# Patient Record
Sex: Male | Born: 1940 | Race: White | Hispanic: No | Marital: Married | Smoking: Never smoker
Health system: Southern US, Community
[De-identification: ages and names within clinical notes are randomized; demographics above are authoritative.]

## PROBLEM LIST (undated history)

## (undated) DIAGNOSIS — I700 Atherosclerosis of aorta: Secondary | ICD-10-CM

## (undated) DIAGNOSIS — I2510 Atherosclerotic heart disease of native coronary artery without angina pectoris: Secondary | ICD-10-CM

## (undated) DIAGNOSIS — N200 Calculus of kidney: Secondary | ICD-10-CM

## (undated) DIAGNOSIS — E039 Hypothyroidism, unspecified: Secondary | ICD-10-CM

## (undated) DIAGNOSIS — Z87442 Personal history of urinary calculi: Secondary | ICD-10-CM

## (undated) DIAGNOSIS — I350 Nonrheumatic aortic (valve) stenosis: Secondary | ICD-10-CM

## (undated) DIAGNOSIS — M1990 Unspecified osteoarthritis, unspecified site: Secondary | ICD-10-CM

## (undated) DIAGNOSIS — K449 Diaphragmatic hernia without obstruction or gangrene: Secondary | ICD-10-CM

## (undated) DIAGNOSIS — I639 Cerebral infarction, unspecified: Secondary | ICD-10-CM

## (undated) DIAGNOSIS — K219 Gastro-esophageal reflux disease without esophagitis: Secondary | ICD-10-CM

## (undated) DIAGNOSIS — E785 Hyperlipidemia, unspecified: Secondary | ICD-10-CM

## (undated) DIAGNOSIS — G2581 Restless legs syndrome: Secondary | ICD-10-CM

## (undated) HISTORY — PX: SHOULDER SURGERY: SHX246

## (undated) HISTORY — PX: CHOLECYSTECTOMY: SHX55

## (undated) HISTORY — PX: KNEE ARTHROSCOPY: SUR90

## (undated) HISTORY — PX: OTHER SURGICAL HISTORY: SHX169

## (undated) HISTORY — PX: APPENDECTOMY: SHX54

---

## 2007-03-25 ENCOUNTER — Ambulatory Visit (HOSPITAL_BASED_OUTPATIENT_CLINIC_OR_DEPARTMENT_OTHER): Admission: RE | Admit: 2007-03-25 | Discharge: 2007-03-25 | Payer: Self-pay | Admitting: Orthopedic Surgery

## 2007-06-24 ENCOUNTER — Ambulatory Visit: Payer: Self-pay | Admitting: Internal Medicine

## 2007-07-03 ENCOUNTER — Ambulatory Visit: Payer: Self-pay | Admitting: Internal Medicine

## 2007-08-12 ENCOUNTER — Ambulatory Visit: Payer: Self-pay | Admitting: Internal Medicine

## 2009-11-10 DIAGNOSIS — N419 Inflammatory disease of prostate, unspecified: Secondary | ICD-10-CM | POA: Insufficient documentation

## 2009-11-10 DIAGNOSIS — N201 Calculus of ureter: Secondary | ICD-10-CM | POA: Insufficient documentation

## 2010-10-24 NOTE — Assessment & Plan Note (Signed)
[facility]                         ELECTROPHYSIOLOGY OFFICE NOTE   Jordan Hammond, Jordan Hammond                         MRN:          [id number]  DATE:06/25/2007                            DOB:          08/23/1940    [name] is referred today by Dr. Viann Fish for EP evaluation.  The  patient was hospitalized briefly at [facility] and had  been seen by a physician where he underwent 2-D echocardiogram and  stress testing, both of which were unremarkable.  He does have a history  of palpitations and documented PACs and PVCs by cardiac monitoring, but  was, in fact, told he needed an EP study and a catheterization.  He is  here today for additional evaluation and treatment.  The patient notes  that he does occasionally have chest pain.  This is not related to  exertion.  It is not related to strenuous activity.  It is related to  him sometimes lying or sitting down.  There is no radiation of the pain.  There is no nausea.  There is no associated shortness of breath.   PAST MEDICAL HISTORY:  1. History of recurrent reflux symptoms in the past.  2. History of a meniscal tear to his right knee which was treated with      arthroscopic surgery.  3. History of Nissen fundoplication secondary to severe reflux.   FAMILY HISTORY:  Negative for premature coronary disease, although there  are family members who have had coronary disease.   SOCIAL HISTORY:  The patient is married.  He denies tobacco or ethanol  abuse.   REVIEW OF SYSTEMS:  Otherwise unremarkable except as noted above and for  a very rare palpitation.  He specifically denies any sustained heart  racing.  He has never had syncope.   PHYSICAL EXAMINATION:  GENERAL:  He is a pleasant, very well-appearing,  70 year old man who looks somewhat younger than his stated age.  VITAL SIGNS:  Blood pressure was 121/81, pulse was 70 and regular,  respirations 18, weight 210 pounds.  HEENT:   Normocephalic and atraumatic.  Pupils equal, round.  Oropharynx  is moist.  Sclerae anicteric.  NECK:  No jugular distention.  LUNGS:  Clear bilaterally to auscultation.  No wheezes, rales or rhonchi  are present.  CARDIAC:  Regular rate and rhythm with normal S1, S2.  No murmurs, rubs,  gallops.  PMI was not enlarged or laterally displaced.  EXTREMITIES:  Demonstrated no edema.  ABDOMEN:  Soft, nontender, nondistended.  No organomegaly present.  SKIN:  Normal.  NEUROLOGIC:  Alert and oriented x3 with cranial nerves intact.  Strength  was 5/5 and symmetric.   IMPRESSION:  1. Minimally symptomatic premature atrial contractions and premature      ventricular contractions.  2. Very atypical chest pain (noncardiac).  3. History of gastroesophageal reflux disease, status post Nissen      fundoplication.   DISCUSSION:  The etiology of the patient's symptoms are unclear to me.  They have been fairly quiet.  I recommend that he undergo regular  exercise treadmill testing to  see if there is any evidence of exercise-  induced arrhythmias or exercise-induced ST and T-wave changes. This will  be scheduled at the earliest possible convenient time.     Doylene Canning. Ladona Ridgel, MD  Electronically Signed    GWT/MedQ  DD: 06/26/2007  DT: 06/27/2007  Job #: [id number]   cc:   Georga Hacking, M.D.

## 2010-10-24 NOTE — Op Note (Signed)
Jordan Hammond, Jordan Hammond                ACCOUNT NO.:  [id number]   MEDICAL RECORD NO.:  [id number]          PATIENT TYPE:  AMB   LOCATION:  DSC                          FACILITY:  [facility]   PHYSICIAN:  Feliberto Gottron. Turner Daniels, M.D.   DATE OF BIRTH:  1941-01-14   DATE OF PROCEDURE:  03/25/2007  DATE OF DISCHARGE:                               OPERATIVE REPORT   PREOPERATIVE DIAGNOSIS:  Medial meniscal tear of the right knee.   POSTOPERATIVE DIAGNOSIS:  Medial meniscal tear of the right knee and  chondromalacia medial femoral condyle and lateral meniscal tear of the  right knee.   PROCEDURE:  Right knee arthroscopic partial medial meniscectomy,  debridement chondromalacia grade 3 from the distal aspect of the medial  femoral condyle and debridement of degenerative tearing of the lateral  meniscus.   SURGEON:  Feliberto Gottron.  Turner Daniels, MD.   FIRST ASSISTANT:  None.   ANESTHETIC:  Local with IV sedation as well as about 5 or 10 minutes of  general mask.   ESTIMATED BLOOD LOSS:  Minimal.   FLUID REPLACEMENT:  800 mL of crystalloid.   DRAINS PLACED:  None.   TOURNIQUET TIME:  None.   INDICATIONS FOR PROCEDURE:  A 70 year old man with catching, popping,  pain and effusion in his right knee who presented to our office a few  days ago with a right knee that was essentially locked, could not come  to full extension and MRI scan from his primary care physician showing a  complex tearing of the posterior horn of the medial meniscus.  In order  to decrease pain and increase function, he is taken to the operating  room for arthroscopic decompression of his right knee.  Risks and  benefits of surgery discussed, questions answered.   DESCRIPTION OF PROCEDURE:  The patient identified by armband and  underwent local block anesthesia of his right knee in the block area at  [facility].  He was then  transported to operating room 2, appropriate site monitors were  attached.   Lateral post applied to the table and the right lower  extremity prepped and draped in the usual sterile fashion from the ankle  to the midthigh.  We began the procedure by making standard inferomedial  and inferolateral peripatellar portals introducing the arthroscope  through the inferolateral portal with the inflow attached and the pump  pressure set at 60.  This caused a significant amount of discomfort and  at that point, anesthesia elected to begin general mask anesthesia.  Diagnostic arthroscopy revealed essentially a normal right  patellofemoral joint on the medial side, posterior horn of the medial  meniscus was in fact shredded, had large parrot beak tear and this was  debrided back to a stable margin using a small biter and a 3.5 Gator  sucker shaver.  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  The ACL and PCL were noted to be intact.  The lateral side was pristine  except for a  small degenerative tearing posterior lateral horn to  lateral meniscus and this was debrided back to a stable margin with 3.5  Gator sucker shaver.  The gutters were cleared medially and laterally.  The knee irrigated out with normal saline solution.  The arthroscopic  instruments were removed and a dressing of Xeroform, 4x4 dressing  sponges, Webril and Ace wrap applied.  The patient was then undraped,  awakened and taken to the recovery room without difficulty.      Feliberto Gottron. Turner Daniels, M.D.  Electronically Signed     FJR/MEDQ  D:  03/25/2007  T:  03/25/2007  Job:  [id number]

## 2010-10-24 NOTE — Assessment & Plan Note (Signed)
Jordan Hammond                         ELECTROPHYSIOLOGY OFFICE NOTE   Jordan Hammond, Jordan Hammond                         MRN:          Hammond  DATE:08/12/2007                            DOB:          Jordan Hammond, Jordan Hammond    HISTORY OF PRESENT ILLNESS:  [name] returns today for follow-up.  He  is a very pleasant 70 year old male with a history of palpitations,  atypical chest pain, who underwent exercise treadmill testing several  weeks ago and returns today for follow-up.  Prior to this, he had  undergone stress Myoview perfusion study with adenosine at the [facility] which was clinically and electrically negative.  The  patient's episodes of chest pain have resolved.  They were typically not  related to exertion.  He had no specific complaints today.  He denies  palpitations, chest pain or shortness of breath.   MEDICATIONS:  1. Lovastatin 20 a day.  2. Klonopin.  3. Boniva.  4. Multiple vitamins.  5. Calcium.  6. Flax seed oil.   PHYSICAL EXAMINATION:  GENERAL:  He is a pleasant well-appearing 67-year-  old man in no acute distress.  VITAL SIGNS:  Blood pressure is 102/76, pulse 76 and regular,  respirations were 18.  Weight was 214 pounds.  NECK:  Revealed no jugular distention.  LUNGS:  Clear bilaterally to auscultation.  No wheezes, rales or rhonchi  are present.  CARDIOVASCULAR:  Regular rate and rhythm, normal S1-S2.  EXTREMITIES:  Demonstrated no edema.   STUDIES:  No EKG was done today.  Review of the patient's exercise  treadmill test demonstrates that he walks for over 9 minutes on a Bruce  protocol.  The patient did have 1-1/2 mm of ST-segment depression with  exertion which resolved immediately with rest.  There were no exercise-  induced arrhythmias.  There were no symptoms of chest pain or shortness  of breath with exercise.  The test was stopped secondary to fatigue.   IMPRESSION:  1. Atypical chest pain.  2. Negative perfusion  Myoview study in the past with negative      treadmill test for exercise-induced arrhythmias but with transient      ST-segment depression resolving spontaneously with rest.  3. History of palpitations.   DISCUSSION:  I have recommend a period of watchful waiting for [name].  His symptoms are resolved.  I have asked that he start a walking program  where he walks several days a week for  20-30 minutes with a gradually increasing the frequency and duration of  his exercise.  I will see him back in the office on a p.r.n. basis.  He  will continue his present medical therapy.     Doylene Canning. Ladona Ridgel, MD  Electronically Signed    GWT/MedQ  DD: 08/12/2007  DT: 08/13/2007  Job #: [id number]   cc:   Georga Hacking, M.D.

## 2010-10-24 NOTE — Letter (Signed)
June 26, 2007    W. Viann Fish, M.D.  [address]  [place],  [place] [address]   RE:  Jordan Hammond, Jordan Hammond  MRN:  [id number]  /  DOB:  July 15, 1940   Dear Karleen Hampshire:   Thank you for referring Jordan Hammond for EP evaluation.  As you  know, he is a very pleasant 70 year old man whose health has been quite  good,  but was seen in the [facility] several weeks ago  with atypical chest pain and palpitations.  Workup to date has been  fairly unremarkable.  He had underwent adenosine stress testing which  reportedly demonstrated no evidence of ischemia, normal LV function and  was otherwise unremarkable.  He has worn a cardiac monitor which  demonstrated no symptoms and very infrequent PACs and PVCs.  He is  presently undergoing a regular monitor.  He has presently undergoing a  long-term monitor as we speak.  Since presenting to the [facility], he does occasionally complain of chest discomfort.  He notes  that this is very definitively not related to exertion or strenuous or  physical activity or to any emotional stress.  He states that he is  experiencing these episodes when he lies down or sometimes when he is  just sitting.  There was no associated shortness of breath or  diaphoresis or radiation of the pain.  He wonders if the pain is not  related to some GI problem, although he does have a history of GI  surgery for to rid him of acid reflux several years ago.  Additional  information is notable in that the patient has LV function of 60% and no  significant valvular problems.  The patient reports to me that it was  requested that he undergo EP study and it was not quite clear why this  was, although he does have some palpitations.  These have not been  particularly severe.   PHYSICAL EXAMINATION:  His physical exam today is basically  unremarkable.   I have discussed the issues and the likely benign nature of the  patient's chest discomfort  with him.  Also it is very clear that his  palpitations are fairly minimal.  I have recommended he undergo regular  exercise treadmill testing and will have this carried out in the next  several days.  Otherwise, a period of watchful waiting would be  appropriate.  Consideration for additional anti-acid medications would  also be in order.  Obviously, if wearing a cardiac monitor and he was to  developed arrhythmias, then additional evaluation might be warranted.  Thanks for referring Jordan Hammond for EP evaluation.    Sincerely,      Doylene Canning. Ladona Ridgel, MD  Electronically Signed    GWT/MedQ  DD: 06/26/2007  DT: 06/26/2007  Job #: [id number]

## 2010-10-24 NOTE — Procedures (Signed)
[facility]                              EXERCISE TREADMILL   Jordan Hammond, Jordan Hammond                         MRN:          [id number]  DATE:07/03/2007                            DOB:          04-05-1941    HISTORY:  The patient is a very pleasant 70 year old male with a history  of palpitations and atypical chest pain who has been referred in the  past by Dr. Donnie Aho.  He has had no documented sustained arrhythmias.  He  had an adenosine stress test performed in the past.  The patient is here  today to see if he has exercise-induced arrhythmias secondary to prior  palpitations and to see whether he has any exercise-induced evidence of  ischemia.   PROCEDURE:  After informed consent was obtained, the patient was prepped  in the usual manner.  His initial heart rate was 75, and his blood  pressure 123/83.  He underwent exercise treadmill testing utilizing the  Bruce protocol.  His heart rate increased from the low 100s initially  into exercise up to a high of 168 beats per minute which was above 100%  of predicted for his 70 years of age.  The patient's blood pressure  which was initially in the 120s increased up to 150 and then at the end  of exercise dropped to 137.  He exercised for a total of 9 minutes on  the Bruce protocol completing Stage 3.  The patient had no chest pain or  shortness of breath, though he did become fatigued and his test was  stopped secondary to this.  Electrically, the patient had almost 2 mm of  downsloping ST segment depression in the lateral leads which resolved  almost immediately within the recovery.  In recovery, he was observed  for 6 minutes, and his heart rate and blood pressure returned back to  normal.   COMPLICATIONS:  There are no major complications.   RESULTS:  This demonstrates a clinically negative and electrically  positive exercise treadmill test with the blood pressure's response  attenuated.  The very quick  resolution of his ST segment changes reduces  the specificity of the results of the treadmill test.   PLAN:  I plan to see the patient back in the office in followup for  additional evaluation.     Doylene Canning. Ladona Ridgel, MD  Electronically Signed    GWT/MedQ  DD: 07/03/2007  DT: 07/04/2007  Job #: [id number]   cc:   Georga Hacking, M.D.

## 2012-03-12 DIAGNOSIS — T17308A Unspecified foreign body in larynx causing other injury, initial encounter: Secondary | ICD-10-CM | POA: Insufficient documentation

## 2012-03-12 DIAGNOSIS — R1310 Dysphagia, unspecified: Secondary | ICD-10-CM | POA: Insufficient documentation

## 2012-03-12 DIAGNOSIS — R490 Dysphonia: Secondary | ICD-10-CM | POA: Insufficient documentation

## 2012-03-12 DIAGNOSIS — K219 Gastro-esophageal reflux disease without esophagitis: Secondary | ICD-10-CM | POA: Insufficient documentation

## 2012-04-22 DIAGNOSIS — K279 Peptic ulcer, site unspecified, unspecified as acute or chronic, without hemorrhage or perforation: Secondary | ICD-10-CM | POA: Insufficient documentation

## 2012-04-22 DIAGNOSIS — Z9889 Other specified postprocedural states: Secondary | ICD-10-CM | POA: Insufficient documentation

## 2014-09-08 DIAGNOSIS — S8002XA Contusion of left knee, initial encounter: Secondary | ICD-10-CM | POA: Insufficient documentation

## 2015-05-27 DIAGNOSIS — Z1211 Encounter for screening for malignant neoplasm of colon: Secondary | ICD-10-CM | POA: Insufficient documentation

## 2015-08-05 DIAGNOSIS — K635 Polyp of colon: Secondary | ICD-10-CM | POA: Insufficient documentation

## 2016-03-05 DIAGNOSIS — R252 Cramp and spasm: Secondary | ICD-10-CM | POA: Insufficient documentation

## 2016-07-07 DIAGNOSIS — M1712 Unilateral primary osteoarthritis, left knee: Secondary | ICD-10-CM | POA: Diagnosis not present

## 2016-09-18 DIAGNOSIS — G4762 Sleep related leg cramps: Secondary | ICD-10-CM | POA: Diagnosis not present

## 2016-09-18 DIAGNOSIS — M1712 Unilateral primary osteoarthritis, left knee: Secondary | ICD-10-CM | POA: Diagnosis not present

## 2016-09-18 DIAGNOSIS — R011 Cardiac murmur, unspecified: Secondary | ICD-10-CM | POA: Diagnosis not present

## 2016-09-18 DIAGNOSIS — G2581 Restless legs syndrome: Secondary | ICD-10-CM | POA: Diagnosis not present

## 2016-12-24 DIAGNOSIS — M159 Polyosteoarthritis, unspecified: Secondary | ICD-10-CM | POA: Diagnosis not present

## 2016-12-24 DIAGNOSIS — E785 Hyperlipidemia, unspecified: Secondary | ICD-10-CM | POA: Diagnosis not present

## 2016-12-24 DIAGNOSIS — E559 Vitamin D deficiency, unspecified: Secondary | ICD-10-CM | POA: Diagnosis not present

## 2016-12-24 DIAGNOSIS — R011 Cardiac murmur, unspecified: Secondary | ICD-10-CM | POA: Diagnosis not present

## 2016-12-24 DIAGNOSIS — E039 Hypothyroidism, unspecified: Secondary | ICD-10-CM | POA: Diagnosis not present

## 2016-12-24 DIAGNOSIS — R5383 Other fatigue: Secondary | ICD-10-CM | POA: Diagnosis not present

## 2016-12-24 DIAGNOSIS — N400 Enlarged prostate without lower urinary tract symptoms: Secondary | ICD-10-CM | POA: Diagnosis not present

## 2016-12-24 DIAGNOSIS — G2581 Restless legs syndrome: Secondary | ICD-10-CM | POA: Diagnosis not present

## 2016-12-24 DIAGNOSIS — E782 Mixed hyperlipidemia: Secondary | ICD-10-CM | POA: Diagnosis not present

## 2016-12-24 DIAGNOSIS — G8929 Other chronic pain: Secondary | ICD-10-CM | POA: Diagnosis not present

## 2016-12-24 DIAGNOSIS — R350 Frequency of micturition: Secondary | ICD-10-CM | POA: Diagnosis not present

## 2016-12-24 DIAGNOSIS — Z79899 Other long term (current) drug therapy: Secondary | ICD-10-CM | POA: Diagnosis not present

## 2017-01-19 ENCOUNTER — Emergency Department (HOSPITAL_COMMUNITY): Payer: Medicare HMO

## 2017-01-19 ENCOUNTER — Encounter (HOSPITAL_COMMUNITY): Payer: Self-pay | Admitting: Emergency Medicine

## 2017-01-19 ENCOUNTER — Telehealth: Payer: Self-pay | Admitting: Nurse Practitioner

## 2017-01-19 ENCOUNTER — Inpatient Hospital Stay (HOSPITAL_COMMUNITY)
Admission: EM | Admit: 2017-01-19 | Discharge: 2017-02-04 | DRG: 217 | Disposition: A | Discharge status: Home / Self Care | Payer: Medicare HMO | Attending: Cardiothoracic Surgery | Admitting: Cardiothoracic Surgery

## 2017-01-19 DIAGNOSIS — M546 Pain in thoracic spine: Secondary | ICD-10-CM

## 2017-01-19 DIAGNOSIS — G2581 Restless legs syndrome: Secondary | ICD-10-CM

## 2017-01-19 DIAGNOSIS — Z882 Allergy status to sulfonamides status: Secondary | ICD-10-CM

## 2017-01-19 DIAGNOSIS — E785 Hyperlipidemia, unspecified: Secondary | ICD-10-CM | POA: Diagnosis present

## 2017-01-19 DIAGNOSIS — R011 Cardiac murmur, unspecified: Secondary | ICD-10-CM

## 2017-01-19 DIAGNOSIS — J942 Hemothorax: Secondary | ICD-10-CM

## 2017-01-19 DIAGNOSIS — Z79899 Other long term (current) drug therapy: Secondary | ICD-10-CM

## 2017-01-19 DIAGNOSIS — I350 Nonrheumatic aortic (valve) stenosis: Secondary | ICD-10-CM

## 2017-01-19 DIAGNOSIS — R001 Bradycardia, unspecified: Secondary | ICD-10-CM | POA: Diagnosis not present

## 2017-01-19 DIAGNOSIS — Z01818 Encounter for other preprocedural examination: Secondary | ICD-10-CM

## 2017-01-19 DIAGNOSIS — Z951 Presence of aortocoronary bypass graft: Secondary | ICD-10-CM

## 2017-01-19 DIAGNOSIS — R072 Precordial pain: Secondary | ICD-10-CM

## 2017-01-19 DIAGNOSIS — J95811 Postprocedural pneumothorax: Secondary | ICD-10-CM | POA: Diagnosis not present

## 2017-01-19 DIAGNOSIS — Z886 Allergy status to analgesic agent status: Secondary | ICD-10-CM

## 2017-01-19 DIAGNOSIS — R0602 Shortness of breath: Secondary | ICD-10-CM | POA: Diagnosis not present

## 2017-01-19 DIAGNOSIS — I2510 Atherosclerotic heart disease of native coronary artery without angina pectoris: Secondary | ICD-10-CM

## 2017-01-19 DIAGNOSIS — D62 Acute posthemorrhagic anemia: Secondary | ICD-10-CM | POA: Diagnosis not present

## 2017-01-19 DIAGNOSIS — K029 Dental caries, unspecified: Secondary | ICD-10-CM | POA: Diagnosis present

## 2017-01-19 DIAGNOSIS — I700 Atherosclerosis of aorta: Secondary | ICD-10-CM

## 2017-01-19 DIAGNOSIS — I25110 Atherosclerotic heart disease of native coronary artery with unstable angina pectoris: Secondary | ICD-10-CM | POA: Diagnosis not present

## 2017-01-19 DIAGNOSIS — R079 Chest pain, unspecified: Secondary | ICD-10-CM | POA: Diagnosis not present

## 2017-01-19 DIAGNOSIS — K0530 Chronic periodontitis, unspecified: Secondary | ICD-10-CM | POA: Diagnosis present

## 2017-01-19 DIAGNOSIS — D696 Thrombocytopenia, unspecified: Secondary | ICD-10-CM | POA: Diagnosis not present

## 2017-01-19 DIAGNOSIS — J939 Pneumothorax, unspecified: Secondary | ICD-10-CM

## 2017-01-19 DIAGNOSIS — Z8249 Family history of ischemic heart disease and other diseases of the circulatory system: Secondary | ICD-10-CM

## 2017-01-19 DIAGNOSIS — Z87442 Personal history of urinary calculi: Secondary | ICD-10-CM

## 2017-01-19 DIAGNOSIS — Z952 Presence of prosthetic heart valve: Secondary | ICD-10-CM

## 2017-01-19 DIAGNOSIS — K219 Gastro-esophageal reflux disease without esophagitis: Secondary | ICD-10-CM | POA: Diagnosis present

## 2017-01-19 DIAGNOSIS — E8770 Fluid overload, unspecified: Secondary | ICD-10-CM | POA: Diagnosis not present

## 2017-01-19 DIAGNOSIS — Y832 Surgical operation with anastomosis, bypass or graft as the cause of abnormal reaction of the patient, or of later complication, without mention of misadventure at the time of the procedure: Secondary | ICD-10-CM | POA: Diagnosis not present

## 2017-01-19 DIAGNOSIS — K06010 Localized gingival recession, unspecified: Secondary | ICD-10-CM | POA: Diagnosis present

## 2017-01-19 HISTORY — DX: Hyperlipidemia, unspecified: E78.5

## 2017-01-19 HISTORY — DX: Restless legs syndrome: G25.81

## 2017-01-19 HISTORY — DX: Diaphragmatic hernia without obstruction or gangrene: K44.9

## 2017-01-19 HISTORY — DX: Atherosclerosis of aorta: I70.0

## 2017-01-19 HISTORY — DX: Gastro-esophageal reflux disease without esophagitis: K21.9

## 2017-01-19 HISTORY — DX: Atherosclerotic heart disease of native coronary artery without angina pectoris: I25.10

## 2017-01-19 HISTORY — DX: Calculus of kidney: N20.0

## 2017-01-19 HISTORY — DX: Nonrheumatic aortic (valve) stenosis: I35.0

## 2017-01-19 LAB — BASIC METABOLIC PANEL
ANION GAP: 7 (ref 5–15)
BUN: 12 mg/dL (ref 6–20)
CO2: 26 mmol/L (ref 22–32)
Calcium: 8.8 mg/dL — ABNORMAL LOW (ref 8.9–10.3)
Chloride: 103 mmol/L (ref 101–111)
Creatinine, Ser: 1.11 mg/dL (ref 0.61–1.24)
GFR calc Af Amer: >60 mL/min (ref >60)
Glucose, Bld: 93 mg/dL (ref 65–99)
POTASSIUM: 3.8 mmol/L (ref 3.5–5.1)
SODIUM: 136 mmol/L (ref 135–145)

## 2017-01-19 LAB — I-STAT TROPONIN, ED: Troponin i, poc: 0 ng/mL (ref 0.00–0.08)

## 2017-01-19 LAB — CBC
HEMATOCRIT: 38.3 % — AB (ref 39.0–52.0)
HEMOGLOBIN: 13.2 g/dL (ref 13.0–17.0)
MCH: 31.3 pg (ref 26.0–34.0)
MCHC: 34.5 g/dL (ref 30.0–36.0)
MCV: 90.8 fL (ref 78.0–100.0)
Platelets: 166 10*3/uL (ref 150–400)
RBC: 4.22 MIL/uL (ref 4.22–5.81)
RDW: 12.8 % (ref 11.5–15.5)
WBC: 5.1 10*3/uL (ref 4.0–10.5)

## 2017-01-19 MED ORDER — IOPAMIDOL (ISOVUE-370) INJECTION 76%
INTRAVENOUS | Status: AC
  Administered 2017-01-19: 100 mL via INTRAVENOUS
  Filled 2017-01-19: qty 100

## 2017-01-19 MED ORDER — FAMOTIDINE IN NACL 20-0.9 MG/50ML-% IV SOLN
20.0000 mg | Freq: Once | INTRAVENOUS | Status: AC
  Administered 2017-01-19: 20 mg via INTRAVENOUS
  Filled 2017-01-19: qty 50

## 2017-01-19 NOTE — ED Provider Notes (Signed)
[facility]-EMERGENCY DEPT Provider Note   CSN: [id number] Arrival date & time: 01/19/17  1808     History   Chief Complaint Chief Complaint  Patient presents with  . Chest Pain    HPI Jordan Hammond is a 76 y.o. male.  Jordan Hammond is a 76 y.o. Male with a history of a heart murmur who presents to the emergency department complaining of chest pain and tightness ongoing since last night. Patient reports began having some substernal chest pain with associated pain to his upper back around 10 PM yesterday evening. He took one nitroglycerin without relief and then went to bed. He reports he woke up this morning and had gradual return of his chest pain. He reports it's been on and off during the day today. He is unable to identify any alleviating or aggravating factors for his chest pain. It is not worse with exertion. He denies any current shortness of breath, however he reports sometimes he does feel somewhat short of breath. He denies any coughing or hemoptysis. He is seeing cardiology previously for a heart murmur and reports that his echo was unremarkable. He denies history of MI, DVT or PE. No recent long travel or smoking. No leg pain or swelling. He reports possible history of hyperlipidemia, however he is not on medication. He is not a smoker. Patient does report he's been sitting at church more over the past several days. He reports this is in the does intermittently and reports he had a similar problem with pain in his chest last time he started singing again. He wonders if this could be the cause of his pain. He does report feeling slightly more fatigued over the past several weeks. No dyspnea on exertion. He denies having lightheaded and dizzy. He denies fevers, palpitations, leg pain, [place], syncope, lightheadedness, abdominal pain, nausea, vomiting, diarrhea, numbness, tingling, weakness or rashes.  He took four 325 mg asa at home prior to arrival. EMS provided him with NTG paste.    The  history is provided by the patient and medical records. No language interpreter was used.  Chest Pain   Associated symptoms include back pain. Pertinent negatives include no abdominal pain, no cough, no dizziness, no fever, no headaches, no nausea, no numbness, no palpitations, no shortness of breath, no vomiting and no weakness.    Past Medical History:  Diagnosis Date  . Murmur, cardiac   . Restless leg syndrome     Patient Active Problem List   Diagnosis Date Noted  . Chest pain at rest 01/20/2017  . Bradycardia 01/20/2017  . Restless leg syndrome 01/20/2017  . Precordial pain     Past Surgical History:  Procedure Laterality Date  . bowel obstruction surgery    . CHOLECYSTECTOMY    . lap nissan         Home Medications    Prior to Admission medications   Medication Sig Start Date End Date Taking? Authorizing Provider  acetaminophen (TYLENOL) 500 MG tablet Take 1,000 mg by mouth every 6 (six) hours as needed for headache.   Yes [provider]  calcium citrate-vitamin D (CALCIUM + D) 315-200 MG-UNIT tablet Take 1 tablet by mouth daily.   Yes [provider]  Cholecalciferol (VITAMIN D3) 5000 units CAPS Take 5,000 Units by mouth daily.   Yes [provider]  clonazePAM (KLONOPIN) 1 MG tablet Take 1 mg by mouth at bedtime.   Yes [provider]  Flaxseed Oil OIL Take 1,000 mg by mouth daily.  Yes [provider]  gabapentin (NEURONTIN) 300 MG capsule Take 300 mg by mouth daily. 05/04/15  Yes [provider]  nitroGLYCERIN (NITROSTAT) 0.4 MG SL tablet Place 0.4 mg under the tongue daily as needed. 06/21/15  Yes [provider]    Family History Family History  Problem Relation Age of Onset  . CAD Father        s/p triple bypass    Social History Social History  Substance Use Topics  . Smoking status: Never Smoker  . Smokeless tobacco: Never Used  . Alcohol use No     Allergies   Aspirin;  Erythromycin; Ibuprofen; Nabumetone; Nsaids; Sulfa antibiotics; Tetracycline; Sulfamethoxazole; and Sulfur   Review of Systems Review of Systems  Constitutional: Positive for fatigue. Negative for chills and fever.  HENT: Negative for congestion and sore throat.   Eyes: Negative for visual disturbance.  Respiratory: Negative for cough, shortness of breath and wheezing.   Cardiovascular: Positive for chest pain. Negative for palpitations and leg swelling.  Gastrointestinal: Negative for abdominal pain, diarrhea, nausea and vomiting.  Genitourinary: Negative for dysuria.  Musculoskeletal: Positive for back pain. Negative for joint swelling and neck pain.  Skin: Negative for rash.  Neurological: Negative for dizziness, syncope, weakness, light-headedness, numbness and headaches.     Physical Exam Updated Vital Signs BP (!) 93/50   Pulse (!) 51   Temp (!) 97.4 F (36.3 C) (Oral)   Resp 18   Ht 6' (1.829 m)   Wt 82.6 kg (182 lb)   SpO2 97%   BMI 24.68 kg/m   Physical Exam  Constitutional: He is oriented to person, place, and time. He appears well-developed and well-nourished. No distress.  Nontoxic appearing.  HENT:  Head: Normocephalic and atraumatic.  Right Ear: External ear normal.  Left Ear: External ear normal.  Mouth/Throat: Oropharynx is clear and moist.  Eyes: Pupils are equal, round, and reactive to light. Conjunctivae are normal. Right eye exhibits no discharge. Left eye exhibits no discharge.  Neck: Normal range of motion. Neck supple. No JVD present. No tracheal deviation present.  Cardiovascular: Normal rate, regular rhythm and intact distal pulses.  Exam reveals no gallop and no friction rub.   Murmur heard. Faint blowing systolic murmur noted. Bilateral radial, posterior tibialis and dorsalis pedis pulses are intact.    Pulmonary/Chest: Effort normal and breath sounds normal. No stridor. No respiratory distress. He has no wheezes. He has no rales. He exhibits  tenderness.  Lungs are clear to ascultation bilaterally. Symmetric chest expansion bilaterally. No increased work of breathing. No rales or rhonchi.   Substernal chest wall is tender to palpation reproduces his pain.  Abdominal: Soft. He exhibits no mass. There is no tenderness. There is no guarding.  Musculoskeletal: Normal range of motion. He exhibits no edema or tenderness.  No lower extremity edema or tenderness.  Lymphadenopathy:    He has no cervical adenopathy.  Neurological: He is alert and oriented to person, place, and time. No cranial nerve deficit or sensory deficit. Coordination normal.  Skin: Skin is warm and dry. Capillary refill takes less than 2 seconds. No rash noted. He is not diaphoretic. No erythema. No pallor.  Psychiatric: He has a normal mood and affect. His behavior is normal.  Nursing note and vitals reviewed.    ED Treatments / Results  Labs (all labs ordered are listed, but only abnormal results are displayed) Labs Reviewed  BASIC METABOLIC PANEL - Abnormal; Notable for the following:  Result Value   Calcium 8.8 (*)    All other components within normal limits  CBC - Abnormal; Notable for the following:    HCT 38.3 (*)    All other components within normal limits  I-STAT TROPONIN, ED    EKG  EKG Interpretation  Date/Time:  Saturday January 19 2017 18:16:46 EDT Ventricular Rate:  56 PR Interval:    QRS Duration: 74 QT Interval:  450 QTC Calculation: 435 R Axis:   31 Text Interpretation:  Sinus rhythm Abnormal R-wave progression, early transition Minimal ST elevation, inferior leads Confirmed by Ranae Palms  MD, DAVID ([id number]) on 01/19/2017 6:34:23 PM       Radiology Dg Chest 2 View  Result Date: 01/19/2017 CLINICAL DATA:  Chest pain and tightness since last night. Mild shortness of breath. EXAM: CHEST  2 VIEW COMPARISON:  None. FINDINGS: Normal sized heart. Clear lungs with normal vascularity. Mild diffuse peribronchial thickening and  accentuation of the interstitial markings. Thoracic spine degenerative changes. IMPRESSION: No acute abnormality.  Mild chronic bronchitic changes. Electronically Signed   By: Beckie Salts M.D.   On: 01/19/2017 19:23   Ct Angio Chest/abd/pel For Dissection W And/or W/wo  Result Date: 01/20/2017 CLINICAL DATA:  Chest pain starting last evening without relief from nitroglycerin. EXAM: CT ANGIOGRAPHY CHEST, ABDOMEN AND PELVIS TECHNIQUE: Multidetector CT imaging through the chest, abdomen and pelvis was performed using the standard protocol during bolus administration of intravenous contrast. Multiplanar reconstructed images and MIPs were obtained and reviewed to evaluate the vascular anatomy. CONTRAST:  100 cc Isovue 370 IV COMPARISON:  06/01/2007 CT chest report FINDINGS: CTA CHEST FINDINGS Cardiovascular: No aortic aneurysm or dissection. No acute pulmonary embolus. Normal size heart without pericardial effusion. 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 Mediastinum/Nodes: Calcified mediastinal and hilar lymph nodes consistent with old granulomatous disease noted in the prevascular mediastinum, aorticopulmonary window and left hilum. Trachea and mainstem bronchi are patent. No thyroid abnormality. Esophagus is unremarkable. Lungs/Pleura: Lungs are clear. No pleural effusion or pneumothorax. Musculoskeletal: No chest wall abnormality. No acute or significant osseous findings. Thoracic spondylosis with mild disc space narrowing and multilevel anterior osteophyte formation. Review of the MIP images confirms the above findings. CTA ABDOMEN AND PELVIS FINDINGS VASCULAR Aorta: Normal caliber aorta without aneurysm, dissection, vasculitis or significant stenosis. Mild atherosclerosis. Celiac: Patent without evidence of aneurysm, dissection, vasculitis or significant stenosis. SMA: Patent without evidence of aneurysm, dissection, vasculitis or significant stenosis. Renals: Both renal arteries are patent  without evidence of aneurysm, dissection, vasculitis, fibromuscular dysplasia or significant stenosis. IMA: Patent without evidence of aneurysm, dissection, vasculitis or significant stenosis. Inflow: Patent without evidence of aneurysm, dissection, vasculitis or significant stenosis. Veins: No obvious venous abnormality within the limitations of this arterial phase study. Review of the MIP images confirms the above findings. NON-VASCULAR Hepatobiliary: Nonspecific hypodensities in the left and right hepatic lobes consistent statistically with cysts or hemangiomata, the largest is in the right hepatic lobe measuring approximately 2.8 x 2.6 x 2 cm. There is a 1.1 cm hypodensity in the left hepatic lobe. There is no biliary dilatation. Gallbladder is surgically absent. Pancreas: Atrophic appearance of the pancreas without ductal dilatation or mass. Spleen: Normal Adrenals/Urinary Tract: Normal bilateral adrenal glands. No renal cortical thinning, obstructive uropathy or enhancing mass. No hydroureteronephrosis. Urinary bladder is physiologically distended. Stomach/Bowel: Stomach is within normal limits. Appendix is not confidently identified however no findings of right lower quadrant or pericecal inflammation. No evidence of bowel wall thickening, distention, or inflammatory changes. Lymphatic: No lymphadenopathy.  Reproductive: Prostate is unremarkable. Other: No abdominal wall hernia or abnormality. No abdominopelvic ascites. Musculoskeletal: No acute or significant osseous findings. Review of the MIP images confirms the above findings. IMPRESSION: 1. No evidence of aortic aneurysm or dissection. 2. Mild aortic and branch vessel atherosclerosis. 3. No acute pulmonary embolus. 4. Clear lungs. 5. Nonspecific hepatic hypodensities statistically consistent with cysts or hemangiomas, the largest in the right hepatic lobe measuring 2.8 x 2.6 x 2 cm with a 1.1 cm left hepatic lobe hypodensity also noted. Electronically  Signed   By: Tollie Eth M.D.   On: 01/20/2017 00:00    Procedures Procedures (including critical care time)  Medications Ordered in ED Medications  famotidine (PEPCID) IVPB 20 mg premix (0 mg Intravenous Stopped 01/19/17 2331)  iopamidol (ISOVUE-370) 76 % injection (100 mLs Intravenous Contrast Given 01/19/17 2312)     Initial Impression / Assessment and Plan / ED Course  I have reviewed the triage vital signs and the nursing notes.  Pertinent labs & imaging results that were available during my care of the patient were reviewed by me and considered in my medical decision making (see chart for details).  Clinical Course as of Jan 21 144  Sat Jan 19, 2017  1945 Asked RN to please draw blood work   [WD]  2100 Notified charge blood work has still not been drawn. She will help when done with sepsis patient. I attempted to call phlebotomy to draw blood and they are unavailable.    [WD]    Clinical Course User Index [WD] Everlene Farrier, PA-C   This is a 76 y.o. Male with a history of a heart murmur who presents to the emergency department complaining of chest pain and tightness ongoing since last night. Patient reports began having some substernal chest pain with associated pain to his upper back around 10 PM yesterday evening. He took one nitroglycerin without relief and then went to bed. He reports he woke up this morning and had gradual return of his chest pain. He reports it's been on and off during the day today. He is unable to identify any alleviating or aggravating factors for his chest pain. It is not worse with exertion. He denies any current shortness of breath, however he reports sometimes he does feel somewhat short of breath. He denies any coughing or hemoptysis. He is seeing cardiology previously for a heart murmur and reports that his echo was unremarkable. He denies history of MI, DVT or PE. No recent long travel or smoking. No leg pain or swelling. He reports possible history  of hyperlipidemia, however he is not on medication. He is not a smoker. Patient does report he's been sitting at church more over the past several days. He reports this is in the does intermittently and reports he had a similar problem with pain in his chest last time he started singing again. He wonders if this could be the cause of his pain. He does report feeling slightly more fatigued over the past several weeks.   He took four 325 mg asa at home prior to arrival. EMS provided him with NTG paste.   On exam patient is afebrile nontoxic appearing. Heart rate is in the 50s. He has a blowing systolic murmur. No lower extremity edema or tenderness. Intact distal pulses are equal. Lungs clear to auscultation bilaterally. No focal neurological deficits. EKG shows some minimal ST elevation in lateral leads. Troponin is not elevated. BMP and CBC are unremarkable. Chest x-ray is unremarkable.  Because the patient is having chest pain that radiates to his back he obtained a CT angiogram of the chest abdomen and pelvis to rule out dissection. This showed no evidence of dissection.  Based on records I find the patient's echo showed aortic stenosis and regurgitation. The question of this is causing the patient's fatigue. I give the patient HEART score of 5. He needs admission today for ACS rule out and likely echocardiogram. Patient agrees with plan for admission.   During the ER course he does have some hypotension. I suspect this is related to the nitroglycerin that was provided to him by EMS. I wiped this off his chest and his blood pressure improved to 107 systolic.   I consulted with [facility] hospitalist who accepted the patient for admission.   This patient was discussed with and evaluated by Dr. Ranae Palms who agrees with assessment and plan.  Final Clinical Impressions(s) / ED Diagnoses   Final diagnoses:  Precordial pain  Acute midline thoracic back pain  Heart murmur    New Prescriptions New  Prescriptions   No medications on file     Everlene Farrier, Cordelia Poche 01/20/17 0152    Loren Racer, MD 01/20/17 [phone]

## 2017-01-19 NOTE — ED Notes (Signed)
Patient transported to CT 

## 2017-01-19 NOTE — ED Notes (Signed)
Phlebotomy called to obtain lab work

## 2017-01-19 NOTE — Telephone Encounter (Signed)
   Pts wife called to report that since the evening of 8/10, pt has been having chest discomfort that radiates through to his back.  He is a patient of Dr. York Spanielilley's.  I recommended that if he is having ongoing chest pain, she should call 911 for EMS eval, treatment, and [facility].  Caller verbalized understanding and was grateful for the call back.  Nicolasa Duckinghristopher Tai Syfert, NP 01/19/2017, 4:17 PM

## 2017-01-19 NOTE — ED Triage Notes (Addendum)
[facility] with c/o chest pain that started last night at approx 1030pm-- took 1 NTG-- without immediate relief --  Took 1 ASA 324 mg last night, took 4 ASA 324mg  today, 3 NTG sl and 1" of paste to left chest per EMS  Has had chest pain in past but states "has not lasted this long"  Pain is still 4/10 with nausea

## 2017-01-19 NOTE — ED Notes (Addendum)
Pt sts he yawned a little while ago and now his jaw feel like it's popping in and out of place.  This RN can feel the pt's L tempromandibular joint pop when he opens his mouth.

## 2017-01-20 ENCOUNTER — Observation Stay (HOSPITAL_COMMUNITY): Payer: Medicare HMO

## 2017-01-20 ENCOUNTER — Encounter (HOSPITAL_COMMUNITY): Payer: Self-pay | Admitting: Internal Medicine

## 2017-01-20 DIAGNOSIS — R001 Bradycardia, unspecified: Secondary | ICD-10-CM | POA: Diagnosis not present

## 2017-01-20 DIAGNOSIS — K219 Gastro-esophageal reflux disease without esophagitis: Secondary | ICD-10-CM | POA: Diagnosis not present

## 2017-01-20 DIAGNOSIS — D62 Acute posthemorrhagic anemia: Secondary | ICD-10-CM | POA: Diagnosis not present

## 2017-01-20 DIAGNOSIS — I700 Atherosclerosis of aorta: Secondary | ICD-10-CM | POA: Diagnosis not present

## 2017-01-20 DIAGNOSIS — R072 Precordial pain: Secondary | ICD-10-CM

## 2017-01-20 DIAGNOSIS — I350 Nonrheumatic aortic (valve) stenosis: Secondary | ICD-10-CM

## 2017-01-20 DIAGNOSIS — Z87442 Personal history of urinary calculi: Secondary | ICD-10-CM | POA: Diagnosis not present

## 2017-01-20 DIAGNOSIS — Z8249 Family history of ischemic heart disease and other diseases of the circulatory system: Secondary | ICD-10-CM | POA: Diagnosis not present

## 2017-01-20 DIAGNOSIS — I359 Nonrheumatic aortic valve disorder, unspecified: Secondary | ICD-10-CM | POA: Diagnosis not present

## 2017-01-20 DIAGNOSIS — I2510 Atherosclerotic heart disease of native coronary artery without angina pectoris: Secondary | ICD-10-CM

## 2017-01-20 DIAGNOSIS — G2581 Restless legs syndrome: Secondary | ICD-10-CM | POA: Diagnosis not present

## 2017-01-20 DIAGNOSIS — J95811 Postprocedural pneumothorax: Secondary | ICD-10-CM | POA: Diagnosis not present

## 2017-01-20 DIAGNOSIS — E785 Hyperlipidemia, unspecified: Secondary | ICD-10-CM | POA: Diagnosis not present

## 2017-01-20 DIAGNOSIS — I25110 Atherosclerotic heart disease of native coronary artery with unstable angina pectoris: Secondary | ICD-10-CM | POA: Diagnosis not present

## 2017-01-20 DIAGNOSIS — R079 Chest pain, unspecified: Secondary | ICD-10-CM

## 2017-01-20 DIAGNOSIS — I200 Unstable angina: Secondary | ICD-10-CM | POA: Diagnosis not present

## 2017-01-20 HISTORY — DX: Atherosclerotic heart disease of native coronary artery without angina pectoris: I25.10

## 2017-01-20 HISTORY — DX: Atherosclerosis of aorta: I70.0

## 2017-01-20 HISTORY — DX: Nonrheumatic aortic (valve) stenosis: I35.0

## 2017-01-20 LAB — TSH: TSH: 2.757 u[IU]/mL (ref 0.350–4.500)

## 2017-01-20 LAB — CBC
HEMATOCRIT: 39.7 % (ref 39.0–52.0)
Hemoglobin: 13.6 g/dL (ref 13.0–17.0)
MCH: 31.2 pg (ref 26.0–34.0)
MCHC: 34.3 g/dL (ref 30.0–36.0)
MCV: 91.1 fL (ref 78.0–100.0)
Platelets: 167 10*3/uL (ref 150–400)
RBC: 4.36 MIL/uL (ref 4.22–5.81)
RDW: 12.9 % (ref 11.5–15.5)
WBC: 5 10*3/uL (ref 4.0–10.5)

## 2017-01-20 LAB — COMPREHENSIVE METABOLIC PANEL
ALBUMIN: 4.2 g/dL (ref 3.5–5.0)
ALK PHOS: 44 U/L (ref 38–126)
ALT: 19 U/L (ref 17–63)
AST: 24 U/L (ref 15–41)
Anion gap: 11 (ref 5–15)
BILIRUBIN TOTAL: 1.2 mg/dL (ref 0.3–1.2)
BUN: 10 mg/dL (ref 6–20)
CALCIUM: 9.3 mg/dL (ref 8.9–10.3)
CO2: 24 mmol/L (ref 22–32)
CREATININE: 1.11 mg/dL (ref 0.61–1.24)
Chloride: 102 mmol/L (ref 101–111)
GFR calc Af Amer: >60 mL/min (ref >60)
GFR calc non Af Amer: >60 mL/min (ref >60)
GLUCOSE: 80 mg/dL (ref 65–99)
Potassium: 3.6 mmol/L (ref 3.5–5.1)
Sodium: 137 mmol/L (ref 135–145)
TOTAL PROTEIN: 6.8 g/dL (ref 6.5–8.1)

## 2017-01-20 LAB — ECHOCARDIOGRAM COMPLETE
AOPV: 0.25 m/s
AOVTI: 79 cm
AV Area VTI index: 0.31 cm2/m2
AV Area VTI: 0.63 cm2
AV Area mean vel: 0.65 cm2
AV Mean grad: 28 mmHg
AV Peak grad: 53 mmHg
AV vel: 0.63
AVA: 0.63 cm2
AVAREAMEANVIN: 0.31 cm2/m2
AVCELMEANRAT: 0.25
AVPKVEL: 364 cm/s
Area-P 1/2: 1.98 cm2
CHL CUP AV PEAK INDEX: 0.31
CHL CUP AV VALUE AREA INDEX: 0.31
CHL CUP MV DEC (S): 380
CHL CUP RV SYS PRESS: 26 mmHg
CHL CUP STROKE VOLUME: 50 mL
CHL CUP TV REG PEAK VELOCITY: 242 cm/s
DOP CAL AO MEAN VELOCITY: 249 cm/s
E/e' ratio: 7.64
EWDT: 380 ms
FS: 26 % — AB (ref 28–44)
HEIGHTINCHES: 72 in
IV/PV OW: 1
LA diam end sys: 29 mm
LA vol A4C: 32.5 ml
LA vol index: 21.8 mL/m2
LA vol: 45 mL
LADIAMINDEX: 1.41 cm/m2
LASIZE: 29 mm
LDCA: 2.54 cm2
LV E/e' medial: 7.64
LV SIMPSON'S DISK: 65
LV TDI E'MEDIAL: 9.25
LV dias vol index: 37 mL/m2
LV dias vol: 77 mL (ref 62–150)
LV sys vol index: 13 mL/m2
LVEEAVG: 7.64
LVELAT: 10.6 cm/s
LVOT SV: 50 mL
LVOT VTI: 19.7 cm
LVOT diameter: 18 mm
LVOT peak VTI: 0.25 cm
LVOT peak grad rest: 3 mmHg
LVOTPV: 90.7 cm/s
LVSYSVOL: 27 mL (ref 21–61)
Lateral S' vel: 10.8 cm/s
MV Peak grad: 3 mmHg
MVPKAVEL: 97 m/s
MVPKEVEL: 81 m/s
P 1/2 time: 111 ms
P 1/2 time: 379 ms
PW: 13 mm — AB (ref 0.6–1.1)
TAPSE: 21.7 mm
TDI e' lateral: 10.6
TRMAXVEL: 242 cm/s
WEIGHTICAEL: 2963.2 [oz_av]

## 2017-01-20 LAB — LIPID PANEL
CHOLESTEROL: 169 mg/dL (ref 0–200)
HDL: 48 mg/dL (ref >40)
LDL Cholesterol: 103 mg/dL — ABNORMAL HIGH (ref 0–99)
TRIGLYCERIDES: 92 mg/dL (ref <150)
Total CHOL/HDL Ratio: 3.5 RATIO
VLDL: 18 mg/dL (ref 0–40)

## 2017-01-20 LAB — CREATININE, SERUM: CREATININE: 1.12 mg/dL (ref 0.61–1.24)

## 2017-01-20 LAB — TROPONIN I
Troponin I: <0.03 ng/mL (ref <0.03)
Troponin I: <0.03 ng/mL (ref <0.03)

## 2017-01-20 LAB — HEPARIN LEVEL (UNFRACTIONATED): Heparin Unfractionated: 0.76 IU/mL — ABNORMAL HIGH (ref 0.30–0.70)

## 2017-01-20 MED ORDER — ENOXAPARIN SODIUM 40 MG/0.4ML ~~LOC~~ SOLN: 40.0000 mg | SUBCUTANEOUS | Status: DC

## 2017-01-20 MED ORDER — MORPHINE SULFATE (PF) 4 MG/ML IV SOLN: 2.0000 mg | INTRAVENOUS | Status: DC | PRN

## 2017-01-20 MED ORDER — HEPARIN BOLUS VIA INFUSION
4000.0000 [IU] | Freq: Once | INTRAVENOUS | Status: AC
Start: 2017-01-20 — End: 2017-01-20
  Administered 2017-01-20: 4000 [IU] via INTRAVENOUS
  Filled 2017-01-20: qty 4000

## 2017-01-20 MED ORDER — NITROGLYCERIN 0.4 MG SL SUBL: 0.4000 mg | SUBLINGUAL_TABLET | SUBLINGUAL | Status: DC | PRN

## 2017-01-20 MED ORDER — ONDANSETRON HCL 4 MG/2ML IJ SOLN: 4.0000 mg | Freq: Four times a day (QID) | INTRAMUSCULAR | Status: DC | PRN

## 2017-01-20 MED ORDER — NITROGLYCERIN 0.4 MG SL SUBL: 0.4000 mg | SUBLINGUAL_TABLET | Freq: Every day | SUBLINGUAL | Status: DC | PRN

## 2017-01-20 MED ORDER — ASPIRIN EC 81 MG PO TBEC
81.0000 mg | DELAYED_RELEASE_TABLET | Freq: Every day | ORAL | Status: DC
  Administered 2017-01-20 – 2017-01-28 (×8): 81 mg via ORAL
  Filled 2017-01-20 (×8): qty 1

## 2017-01-20 MED ORDER — GI COCKTAIL ~~LOC~~: 30.0000 mL | Freq: Four times a day (QID) | ORAL | Status: DC | PRN

## 2017-01-20 MED ORDER — ACETAMINOPHEN 500 MG PO TABS
1000.0000 mg | ORAL_TABLET | Freq: Four times a day (QID) | ORAL | Status: DC | PRN
  Administered 2017-01-21: 1000 mg via ORAL
  Filled 2017-01-20: qty 2

## 2017-01-20 MED ORDER — CALCIUM CARBONATE-VITAMIN D 500-200 MG-UNIT PO TABS
1.0000 | ORAL_TABLET | Freq: Every day | ORAL | Status: DC
  Administered 2017-01-21 – 2017-01-28 (×8): 1 via ORAL
  Filled 2017-01-20 (×8): qty 1

## 2017-01-20 MED ORDER — HEPARIN (PORCINE) IN NACL 100-0.45 UNIT/ML-% IJ SOLN
900.0000 [IU]/h | INTRAMUSCULAR | Status: DC
  Administered 2017-01-20: 1100 [IU]/h via INTRAVENOUS
  Filled 2017-01-20 (×2): qty 250

## 2017-01-20 MED ORDER — ACETAMINOPHEN 500 MG PO TABS: 1000.0000 mg | ORAL_TABLET | Freq: Four times a day (QID) | ORAL | Status: DC | PRN

## 2017-01-20 MED ORDER — GABAPENTIN 300 MG PO CAPS
300.0000 mg | ORAL_CAPSULE | Freq: Every day | ORAL | Status: DC
  Administered 2017-01-20 – 2017-01-28 (×9): 300 mg via ORAL
  Filled 2017-01-20 (×9): qty 1

## 2017-01-20 MED ORDER — CLONAZEPAM 0.5 MG PO TABS
1.0000 mg | ORAL_TABLET | Freq: Every day | ORAL | Status: DC
  Administered 2017-01-20 – 2017-01-28 (×9): 1 mg via ORAL
  Filled 2017-01-20 (×9): qty 2

## 2017-01-20 NOTE — Consult Note (Signed)
Cardiology Consult Note  Admit date: 01/19/2017 Name: Jordan Hammond 76 y.o.  male DOB:  23-Mar-1941 MRN:  [id number]  Today's date:  01/20/2017  Referring Physician:    Dr. Gonzella Lexhungel  Primary Physician:   Dr. Doroteo GlassmanPopano  Reason for Consultation:    Chest pain  IMPRESSIONS: 1.  Recent chest discomfort suggestive of unstable angina pectoris 2.  Moderate aortic stenosis by echocardiogram previously with recent onset of fatigue 3.  Hyperlipidemia 4.  Coronary artery disease as manifested by coronary artery calcification on CT scan 5.  Aortic atherosclerosis 6.  History of hiatal hernia  RECOMMENDATION: 1.  His EKG was normal last night and will get a repeat one today. 2.  Obtain echocardiogram to assess whether there has been any change in his aortic valve gradient since October 3.  Initiate heparin therapy 4.  I would plan cardiac catheterization in light of the chest discomfort occurring at rest relieved with nitroglycerin and in light of the coronary artery disease calcification seen on the CT scan.Cardiac catheterization was discussed with the patient fully including risks of myocardial infarction, death, stroke, bleeding, arrhythmia, dye allergy, renal insufficiency or bleeding.  The patient understands and is willing to proceed.  Possibility of intervention at the same time also discussed with patient and he understands and is agreeable to proceed  HISTORY: This 76 year old male is brought in to the hospital with chest discomfort consistent with unstable angina.  I started following him in 2016 when he had a murmur of aortic stenosis and was found to have mild-to-moderate aortic stenosis.  He has been asymptomatic and his last echocardiogram was October 30 showing a peak grading of 27 mm and a mean gradient of 16 mm with mild to moderate aortic stenosis documented.  He was evaluated several years ago because of PVCs and PACs by Dr. Ladona Ridgelaylor and had a negative treadmill.  More recently he had become  fatigued when working out in the yard and would have to stop and rest frequently over the past 3 weeks.  2 nights ago he developed midsternal burning type chest pain that radiated through to his back.  He took a nitroglycerin that he had on hand that had been given to him by another physician with relief.  He had recurrence of similar tightness radiating through to his back and took additional nitroglycerin yesterday and eventually called and was advised to come in to the emergency room.  He had prolonged chest discomfort and was transported here by EMS and when he arrived he had a normal EKG and the pain eventually abated.  He has had some vague chest discomfort since admission with negative troponins.  He has not had exertional chest discomfort.  He denies PND, orthopnea or edema.     Past Medical History:  Diagnosis Date  . Aortic atherosclerosis (HCC) 01/20/2017  . Aortic stenosis 01/20/2017  . CAD (coronary artery disease), native coronary artery 01/20/2017  . GERD (gastroesophageal reflux disease)   . Hiatal hernia   . Hyperlipidemia   . Kidney stones   . Restless leg syndrome      Past Surgical History:  Procedure Laterality Date  . APPENDECTOMY    . bowel obstruction surgery    . CHOLECYSTECTOMY    . KNEE ARTHROSCOPY    . lap nissan    . SHOULDER SURGERY      Allergies:  is allergic to aspirin; erythromycin; ibuprofen; nabumetone; nsaids; sulfa antibiotics; tetracycline; sulfamethoxazole; and sulfur.   Medications: Prior to Admission medications  Medication Sig Start Date End Date Taking? Authorizing Provider  acetaminophen (TYLENOL) 500 MG tablet Take 1,000 mg by mouth every 6 (six) hours as needed for headache.   Yes [provider]  calcium citrate-vitamin D (CALCIUM + D) 315-200 MG-UNIT tablet Take 1 tablet by mouth daily.   Yes [provider]  Cholecalciferol (VITAMIN D3) 5000 units CAPS Take 5,000 Units by mouth daily.   Yes [provider]   clonazePAM (KLONOPIN) 1 MG tablet Take 1 mg by mouth at bedtime.   Yes [provider]  Flaxseed Oil OIL Take 1,000 mg by mouth daily.   Yes [provider]  gabapentin (NEURONTIN) 300 MG capsule Take 300 mg by mouth daily. 05/04/15  Yes [provider]  nitroGLYCERIN (NITROSTAT) 0.4 MG SL tablet Place 0.4 mg under the tongue daily as needed. 06/21/15  Yes [provider]   Family History: Family Status  Relation Status  . Father Deceased  . Mother Deceased  . Sister Alive    Social History:   reports that he has never smoked. He has never used smokeless tobacco. He reports that he does not drink alcohol or use drugs.   Social History   Social History Narrative   Divorced, remarried.  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  He wears glasses.  He has had some fatigue as noted above.  He has some urinary frequency.  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  He has restless legs for which she takes gabapentin.  Physical Exam: BP 102/61 (BP Location: Right Arm)   Pulse (!) 58   Temp 97.6 F (36.4 C) (Oral)   Resp 16   Ht 6' (1.829 m)   Wt 84 kg (185 lb 3.2 oz)   SpO2 100%   BMI 25.12 kg/m    General appearance: He is a pleasant talkative white male currently in no acute distress Head: Normocephalic, without obvious abnormality, atraumatic Eyes: conjunctivae/corneas clear. PERRL, EOM's intact. Fundi not examined  Neck: no adenopathy, no carotid bruit, no JVD, supple, symmetrical, trachea midline and Transmitted murmur to neck Lungs: no adenopathy, no carotid bruit, no JVD, supple, symmetrical, trachea midline and Transmitted murmur to neck Heart: regular rate and rhythm, S1, S2 normal, no S3 or S4 and 2/6*systolic murmur of aortic stenosis with radiation to the neck  and out to the apex and heard best along the aortic area and left sternal border. Abdomen: soft, non-tender; bowel sounds normal; no masses,  no organomegaly  Rectal: deferred Extremities: extremities normal, atraumatic, no cyanosis or edema Pulses: 2+ and symmetric Skin: Skin color, texture, turgor normal. No rashes or lesions Neurologic: Grossly normal Psych: Alert and oriented x 3 Labs: CBC  Recent Labs  01/20/17 0439  WBC 5.0  RBC 4.36  HGB 13.6  HCT 39.7  PLT 167  MCV 91.1  MCH 31.2  MCHC 34.3  RDW 12.9   CMP   Recent Labs  01/19/17 2122 01/20/17 0439  NA 136  --   K 3.8  --   CL 103  --   CO2 26  --   GLUCOSE 93  --   BUN 12  --   CREATININE 1.11 1.12  CALCIUM 8.8*  --   GFRNONAA >60 >60  GFRAA >60 >60   Cardiac  Panel (last 3 results) Troponin [facility] Test)  Recent Labs  01/19/17 2123  TROPIPOC 0.00   Cardiac Panel (last 3 results)  Recent Labs  01/20/17 0439 01/20/17 0807 01/20/17 1033  TROPONINI <0.03 <0.03 <0.03     Radiology:  Chest x-ray shows no acute abnormality and mild chronic bronchitic changes.  CT scan shows three-vessel coronary atherosclerosis, aortic atherosclerosis, hemangiomas in the liver, no evidence of pulmonary embolus.  EKG: Normal sinus rhythm, normal EKG Independently reviewed by me  Signed:  W. Ashley Royalty MD [facility]   Cardiology Consultant  01/20/2017, 12:29 PM

## 2017-01-20 NOTE — Progress Notes (Signed)
ANTICOAGULATION CONSULT NOTE Pharmacy Consult for Heparin Indication: chest pain/ACS  Allergies  Allergen Reactions  . Aspirin Nausea And Vomiting    Very heavy doses  . Erythromycin Nausea And Vomiting    Unsure if there are other reactions  . Ibuprofen Nausea And Vomiting    Very heavy doses  . Nabumetone Nausea And Vomiting  . Nsaids Nausea And Vomiting    Very heavy doses  . Sulfa Antibiotics   . Tetracycline Nausea And Vomiting    Unsure if there are other reactions  . Sulfamethoxazole Itching, Rash and Swelling  . Sulfur Itching, Rash and Swelling    Patient Measurements: Height: 6' (182.9 cm) Weight: 185 lb 3.2 oz (84 kg) IBW/kg (Calculated) : 77.6  Vital Signs: Temp: 97.9 F (36.6 C) (08/12 1957) Temp Source: Oral (08/12 1957) BP: 97/56 (08/12 1957) Pulse Rate: 66 (08/12 1957)  Labs:  Recent Labs  01/19/17 2122 01/20/17 0439 01/20/17 0807 01/20/17 1033 01/20/17 1252 01/20/17 2131  HGB 13.2 13.6  --   --   --   --   HCT 38.3* 39.7  --   --   --   --   PLT 166 167  --   --   --   --   HEPARINUNFRC  --   --   --   --   --  0.76*  CREATININE 1.11 1.12  --   --  1.11  --   TROPONINI  --  <0.03 <0.03 <0.03  --   --     Estimated Creatinine Clearance: 62.1 mL/min (by C-G formula based on SCr of 1.11 mg/dL).     Assessment: 76yom to begin heparin for unstable angina. No anticoagulants pta. Baseline labs ok. Initial heparin level = 0.76  Goal of Therapy:  Heparin level 0.3-0.7 units/ml Monitor platelets by anticoagulation protocol: Yes   Plan:  Decrease heparin to 1000 units / hr Follow up AM labs  Thank you Okey RegalLisa Maddie Brazier, PharmD [phone] 01/20/2017,10:45 PM

## 2017-01-20 NOTE — Progress Notes (Signed)
ANTICOAGULATION CONSULT NOTE - Initial Consult  Pharmacy Consult for Heparin Indication: chest pain/ACS  Allergies  Allergen Reactions  . Aspirin Nausea And Vomiting    Very heavy doses  . Erythromycin Nausea And Vomiting    Unsure if there are other reactions  . Ibuprofen Nausea And Vomiting    Very heavy doses  . Nabumetone Nausea And Vomiting  . Nsaids Nausea And Vomiting    Very heavy doses  . Sulfa Antibiotics   . Tetracycline Nausea And Vomiting    Unsure if there are other reactions  . Sulfamethoxazole Itching, Rash and Swelling  . Sulfur Itching, Rash and Swelling    Patient Measurements: Height: 6' (182.9 cm) Weight: 185 lb 3.2 oz (84 kg) IBW/kg (Calculated) : 77.6  Vital Signs: Temp: 97.6 F (36.4 C) (08/12 0912) Temp Source: Oral (08/12 0912) BP: 102/61 (08/12 0912) Pulse Rate: 58 (08/12 0912)  Labs:  Recent Labs  01/19/17 2122 01/20/17 0439 01/20/17 0807 01/20/17 1033  HGB 13.2 13.6  --   --   HCT 38.3* 39.7  --   --   PLT 166 167  --   --   CREATININE 1.11 1.12  --   --   TROPONINI  --  <0.03 <0.03 <0.03    Estimated Creatinine Clearance: 61.6 mL/min (by C-G formula based on SCr of 1.12 mg/dL).   Medical History: Past Medical History:  Diagnosis Date  . Aortic atherosclerosis (HCC) 01/20/2017  . Aortic stenosis 01/20/2017  . CAD (coronary artery disease), native coronary artery 01/20/2017  . GERD (gastroesophageal reflux disease)   . Hiatal hernia   . Hyperlipidemia   . Kidney stones   . Restless leg syndrome     Assessment: 76yom to begin heparin for unstable angina. No anticoagulants pta. Baseline labs ok.  Goal of Therapy:  Heparin level 0.3-0.7 units/ml Monitor platelets by anticoagulation protocol: Yes   Plan:  1) Heparin bolus 4000 units x 1 2) Heparin drip at 1100 units/hr 3) Check 8 hour heparin level 4) Daily heparin level and CBC  Fredrik RiggerMarkle, [place] Berkley Sue 01/20/2017,12:47 PM

## 2017-01-20 NOTE — Progress Notes (Signed)
ECHO reviewed and interval increase in gradient since October. Peak gradient now 53 mm with a mean gradient of 23 mm.    Cath planned for tomorrow.  This degree of AS will likely need to be addressed along with the CAD in light of symptoms.   Darden PalmerW. Spencer Tilley, Jr. MD [facility]

## 2017-01-20 NOTE — Progress Notes (Addendum)
PROGRESS NOTE                                                                                                                                                                                                             Patient Demographics:    Jordan Hammond, is a 76 y.o. male, DOB - 25-Jan-1941, BMW:[id number]  Admit date - 01/19/2017   Admitting Physician Inez Catalina, MD  Outpatient Primary MD for the patient is System, Pcp Not In  LOS - 0  Outpatient Specialists:Dr. Donnie Aho  Chief Complaint  Patient presents with  . Chest Pain       Brief Narrative   76 year old male wit history of heart murmur, RLS, GERD status post Nissen fundoplication presented with substernal chest pain irradiating to the sides and to the back with tightness, improved with nitroglycerin and aspirin. Symptoms occurred at rest. Reports having similar less intense symptoms in the past. Reports having stress test sometime in 2010. Also reports intentional weight loss in past 2 months. Patient denies any shortness of breath, diaphoresis, palpitations, nausea, vomiting, abdominal pain, fevers, chills, bowel or urinary symptoms. No sick contact or recent travel. No new medications. In the ED he was found to be bradycardic in the low 50s and occasionally in the 40s. EKG showed sinus rhythm with poor wave progression and initial troponin was negative. Patient placed in observation for ACS rule out.   Subjective:    On my exam this morning he denied any chest pain symptoms. Denies shortness of breath or palpitations.   Assessment  & Plan :    Active Problems:   Chest pain at rest Has both typical and atypical symptoms. Heart score of 5. Monitor on telemetry. Cycle serial cardiac enzymes, EKG in a.m. Patient allergic to full dose aspirin. ( add baby aspirin). When necessary sublingual nitroglycerin. Avoid beta blocker due to sinus bradycardia. Check 2-D  echo. Cardiology consulted, possibly needs inpatient stress test.    Bradycardia No clear etiology. Not on any AV nodal blocking agent. Normal TSH.    Restless leg syndrome Resume his Neurontin and Klonopin      Code Status : Full code  Family Communication  : son at bedside  Disposition Plan  : Pending cardiac workup, possibly home tomorrow  Barriers For Discharge : Active symptoms  Consults  : Cardiology  Procedures  : 2-D echo pending  DVT Prophylaxis  :  Lovenox -   Lab Results  Component Value Date   PLT 167 01/20/2017    Antibiotics  :    Anti-infectives    None        Objective:   Vitals:   01/20/17 0730 01/20/17 0745 01/20/17 0800 01/20/17 0912  BP: (!) 164/86 122/68 116/75 102/61  Pulse: 71 60 (!) 55 (!) 58  Resp: 16 17 18 16   Temp:    97.6 F (36.4 C)  TempSrc:    Oral  SpO2: 94% 100% 100% 100%  Weight:    84 kg (185 lb 3.2 oz)  Height:    6' (1.829 m)    Wt Readings from Last 3 Encounters:  01/20/17 84 kg (185 lb 3.2 oz)    No intake or output data in the 24 hours ending 01/20/17 1208   Physical Exam  Gen: not in distress HEENT:moist mucosa, supple neck Chest: clear b/l, no added sounds CVS: N S1&S2, Systolic murmur3/6, no rubs or gallop GI: soft, NT, ND,  Musculoskeletal: warm, no edema     Data Review:    CBC  Recent Labs Lab 01/19/17 2122 01/20/17 0439  WBC 5.1 5.0  HGB 13.2 13.6  HCT 38.3* 39.7  PLT 166 167  MCV 90.8 91.1  MCH 31.3 31.2  MCHC 34.5 34.3  RDW 12.8 12.9    Chemistries   Recent Labs Lab 01/19/17 2122 01/20/17 0439  NA 136  --   K 3.8  --   CL 103  --   CO2 26  --   GLUCOSE 93  --   BUN 12  --   CREATININE 1.11 1.12  CALCIUM 8.8*  --    ------------------------------------------------------------------------------------------------------------------ No results for input(s): CHOL, HDL, LDLCALC, TRIG, CHOLHDL, LDLDIRECT in the last 72 hours.  No results found for:  HGBA1C ------------------------------------------------------------------------------------------------------------------  Recent Labs  01/20/17 0439  TSH 2.757   ------------------------------------------------------------------------------------------------------------------ No results for input(s): VITAMINB12, FOLATE, FERRITIN, TIBC, IRON, RETICCTPCT in the last 72 hours.  Coagulation profile No results for input(s): INR, PROTIME in the last 168 hours.  No results for input(s): DDIMER in the last 72 hours.  Cardiac Enzymes  Recent Labs Lab 01/20/17 0439 01/20/17 0807 01/20/17 1033  TROPONINI <0.03 <0.03 <0.03   ------------------------------------------------------------------------------------------------------------------ No results found for: BNP  Inpatient Medications  Scheduled Meds: . calcium-vitamin D  1 tablet Oral Q breakfast  . clonazePAM  1 mg Oral QHS  . enoxaparin (LOVENOX) injection  40 mg Subcutaneous Q24H  . gabapentin  300 mg Oral QHS   Continuous Infusions: PRN Meds:.acetaminophen, gi cocktail, morphine injection, nitroGLYCERIN, ondansetron (ZOFRAN) IV  Micro Results No results found for this or any previous visit (from the past 240 hour(s)).  Radiology Reports Dg Chest 2 View  Result Date: 01/19/2017 CLINICAL DATA:  Chest pain and tightness since last night. Mild shortness of breath. EXAM: CHEST  2 VIEW COMPARISON:  None. FINDINGS: Normal sized heart. Clear lungs with normal vascularity. Mild diffuse peribronchial thickening and accentuation of the interstitial markings. Thoracic spine degenerative changes. IMPRESSION: No acute abnormality.  Mild chronic bronchitic changes. Electronically Signed   By: Beckie SaltsSteven  Reid M.D.   On: 01/19/2017 19:23   Ct Angio Chest/abd/pel For Dissection W And/or W/wo  Result Date: 01/20/2017 CLINICAL DATA:  Chest pain starting last evening without relief from nitroglycerin. EXAM: CT ANGIOGRAPHY CHEST, ABDOMEN AND  PELVIS TECHNIQUE: Multidetector CT imaging through the chest, abdomen and pelvis  was performed using the standard protocol during bolus administration of intravenous contrast. Multiplanar reconstructed images and MIPs were obtained and reviewed to evaluate the vascular anatomy. CONTRAST:  100 cc Isovue 370 IV COMPARISON:  06/01/2007 CT chest report FINDINGS: CTA CHEST FINDINGS Cardiovascular: No aortic aneurysm or dissection. No acute pulmonary embolus. Normal size heart without pericardial effusion. 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 Mediastinum/Nodes: Calcified mediastinal and hilar lymph nodes consistent with old granulomatous disease noted in the prevascular mediastinum, aorticopulmonary window and left hilum. Trachea and mainstem bronchi are patent. No thyroid abnormality. Esophagus is unremarkable. Lungs/Pleura: Lungs are clear. No pleural effusion or pneumothorax. Musculoskeletal: No chest wall abnormality. No acute or significant osseous findings. Thoracic spondylosis with mild disc space narrowing and multilevel anterior osteophyte formation. Review of the MIP images confirms the above findings. CTA ABDOMEN AND PELVIS FINDINGS VASCULAR Aorta: Normal caliber aorta without aneurysm, dissection, vasculitis or significant stenosis. Mild atherosclerosis. Celiac: Patent without evidence of aneurysm, dissection, vasculitis or significant stenosis. SMA: Patent without evidence of aneurysm, dissection, vasculitis or significant stenosis. Renals: Both renal arteries are patent without evidence of aneurysm, dissection, vasculitis, fibromuscular dysplasia or significant stenosis. IMA: Patent without evidence of aneurysm, dissection, vasculitis or significant stenosis. Inflow: Patent without evidence of aneurysm, dissection, vasculitis or significant stenosis. Veins: No obvious venous abnormality within the limitations of this arterial phase study. Review of the MIP images confirms the above findings.  NON-VASCULAR Hepatobiliary: Nonspecific hypodensities in the left and right hepatic lobes consistent statistically with cysts or hemangiomata, the largest is in the right hepatic lobe measuring approximately 2.8 x 2.6 x 2 cm. There is a 1.1 cm hypodensity in the left hepatic lobe. There is no biliary dilatation. Gallbladder is surgically absent. Pancreas: Atrophic appearance of the pancreas without ductal dilatation or mass. Spleen: Normal Adrenals/Urinary Tract: Normal bilateral adrenal glands. No renal cortical thinning, obstructive uropathy or enhancing mass. No hydroureteronephrosis. Urinary bladder is physiologically distended. Stomach/Bowel: Stomach is within normal limits. Appendix is not confidently identified however no findings of right lower quadrant or pericecal inflammation. No evidence of bowel wall thickening, distention, or inflammatory changes. Lymphatic: No lymphadenopathy. Reproductive: Prostate is unremarkable. Other: No abdominal wall hernia or abnormality. No abdominopelvic ascites. Musculoskeletal: No acute or significant osseous findings. Review of the MIP images confirms the above findings. IMPRESSION: 1. No evidence of aortic aneurysm or dissection. 2. Mild aortic and branch vessel atherosclerosis. 3. No acute pulmonary embolus. 4. Clear lungs. 5. Nonspecific hepatic hypodensities statistically consistent with cysts or hemangiomas, the largest in the right hepatic lobe measuring 2.8 x 2.6 x 2 cm with a 1.1 cm left hepatic lobe hypodensity also noted. Electronically Signed   By: Tollie Eth M.D.   On: 01/20/2017 00:00    Time Spent in minutes  20   Eddie North M.D on 01/20/2017 at 12:08 PM  Between 7am to 7pm - Pager - [phone]  After 7pm go to [email or web address] - password [facility]  [facility] -  Office  [phone]

## 2017-01-20 NOTE — H&P (Addendum)
History and Physical    Jordan SheerLaverne Hammond UJW:[id number] DOB: 11-19-1940 DOA: 01/19/2017  PCP: [facility] [place] [facility] [place] [facility] [facility] Patient coming from: Home  Chief Complaint: Chest pain  HPI: Jordan Hammond is a 76 y.o. male with medical history significant of RLS, heart murmur, GERD s/p nissan fundiplication who presents with chest pain.  He reports that the pain started around 930pm on Friday, was similar to a tightness and radiated to the back.  At worst a 7/10.  It improved with nitroglycerin and aspirin.  The next day around 10:30am it started again, and then recurred again and EMS was called.  The symptoms started at rest.  He has a good exercise tolerance and has not had chest pain with exercise recently.  He had no nausea, arm or jaw radiation, no diaphoresis or SOB.  He has lost 20# in the last 60 days purposefully as he has stopped eating as much.  He does report worsening fatigue and lightheadedness associated with bending over which has seemed to becoming more frequent in the last 2-3 months.  He has a known heart murmur.  He reports seeing Dr. Arlyn Leakilly (Dr. Sabino Donovanilly's wife is apparently Mr. Virgel PalingMohl's niece).  He had a TTE in February of this year which was reported as stable, but I cannot see in our system.  A TTE from 2016 showed mild LVH, mild AS with gradient of 18mmHG, mild aortic regurg.  Of note, on the monitor, he was having bradycardia into the low 50s, sinus. He is not on a beta blocker.  He has an extensive history of CAD in his father's side of the family.  He is a never smoker.  The pain is not reproducible.   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 He was due to be in 2 performances this weekend.  He remembers a time a few years ago where he had increased tightness in his chest after singing more than usual and he wonders if this could be the culprit.    ED Course: In the ED, initial troponin was 0.  He had  relatively normal labwork.  He had a CT angio of the chest/abdomen which did not show any PE or aortic problems.  He had a CXR Which showed mild bronchitic changes only.    Review of Systems: As per HPI otherwise 10 point review of systems negative.    Past Medical History:  Diagnosis Date  . Murmur, cardiac   . Restless leg syndrome     Past Surgical History:  Procedure Laterality Date  . bowel obstruction surgery    . CHOLECYSTECTOMY    . lap nissan     Reviewed with patient.   reports that he has never smoked. He has never used smokeless tobacco. He reports that he does not drink alcohol or use drugs.  Allergies  Allergen Reactions  . Aspirin Nausea And Vomiting    Very heavy doses  . Erythromycin Nausea And Vomiting    Unsure if there are other reactions  . Ibuprofen Nausea And Vomiting    Very heavy doses  . Nabumetone Nausea And Vomiting  . Nsaids Nausea And Vomiting    Very heavy doses  . Sulfa Antibiotics   . Tetracycline Nausea And Vomiting    Unsure if there are other reactions  . Sulfamethoxazole Itching, Rash and Swelling  . Sulfur Itching, Rash and Swelling   Mother was relatively healthy  until mid 90s.  Family History  Problem Relation Age of Onset  . CAD Father        s/p triple bypass    Prior to Admission medications   Medication Sig Start Date End Date Taking? Authorizing Provider  acetaminophen (TYLENOL) 500 MG tablet Take 1,000 mg by mouth every 6 (six) hours as needed for headache.   Yes [provider]  calcium citrate-vitamin D (CALCIUM + D) 315-200 MG-UNIT tablet Take 1 tablet by mouth daily.   Yes [provider]  Cholecalciferol (VITAMIN D3) 5000 units CAPS Take 5,000 Units by mouth daily.   Yes [provider]  clonazePAM (KLONOPIN) 1 MG tablet Take 1 mg by mouth at bedtime.   Yes [provider]  Flaxseed Oil OIL Take 1,000 mg by mouth daily.   Yes [provider]  gabapentin (NEURONTIN) 300 MG  capsule Take 300 mg by mouth daily. 05/04/15  Yes [provider]  nitroGLYCERIN (NITROSTAT) 0.4 MG SL tablet Place 0.4 mg under the tongue daily as needed. 06/21/15  Yes [provider]    Physical Exam: Vitals:   01/19/17 2000 01/19/17 2015 01/19/17 2045 01/19/17 2157  BP: (!) 92/53 98/66 107/64 (!) 93/50  Pulse: (!) 53 (!) 45 (!) 51 (!) 51  Resp: 14 18 19 18   Temp:      TempSrc:      SpO2: 96% 96% 97% 97%  Weight:      Height:        Constitutional: NAD, calm, comfortable Vitals:   01/19/17 2000 01/19/17 2015 01/19/17 2045 01/19/17 2157  BP: (!) 92/53 98/66 107/64 (!) 93/50  Pulse: (!) 53 (!) 45 (!) 51 (!) 51  Resp: 14 18 19 18   Temp:      TempSrc:      SpO2: 96% 96% 97% 97%  Weight:      Height:       Eyes: Anicteric sclerae, no conjunctival injection.  ENMT: Mucous membranes are moist.  Neck: normal, supple Respiratory: clear to auscultation bilaterally, no wheezing, no crackles. Normal respiratory effort. Cardiovascular: Bradycardic rate and normal rhythm, + early systolic blowing murmur best heard at RUSB.  No rubs / gallops. No extremity edema. 2+ pedal pulses.  Abdomen: no tenderness, no masses palpated. Bowel sounds positive.  Musculoskeletal: no clubbing / cyanosis.  Normal muscle tone.  Skin: no rashes, lesions, ulcers on exposed skin.  Back without any wounds.  Neurologic: Strength and sensation grossly intact.  Psychiatric: Normal judgment and insight. Alert and oriented x 3. Normal mood.    Labs on Admission: I have personally reviewed following labs and imaging studies  CBC:  Recent Labs Lab 01/19/17 2122  WBC 5.1  HGB 13.2  HCT 38.3*  MCV 90.8  PLT 166   Basic Metabolic Panel:  Recent Labs Lab 01/19/17 2122  NA 136  K 3.8  CL 103  CO2 26  GLUCOSE 93  BUN 12  CREATININE 1.11  CALCIUM 8.8*   GFR: Estimated Creatinine Clearance: 62.1 mL/min (by C-G formula based on SCr of 1.11 mg/dL). Liver Function Tests: No  results for input(s): AST, ALT, ALKPHOS, BILITOT, PROT, ALBUMIN in the last 168 hours. No results for input(s): LIPASE, AMYLASE in the last 168 hours. No results for input(s): AMMONIA in the last 168 hours. Coagulation Profile: No results for input(s): INR, PROTIME in the last 168 hours. Cardiac Enzymes: No results for input(s): CKTOTAL, CKMB, CKMBINDEX, TROPONINI in the last 168 hours. BNP (last 3 results)  No results for input(s): PROBNP in the last 8760 hours. HbA1C: No results for input(s): HGBA1C in the last 72 hours. CBG: No results for input(s): GLUCAP in the last 168 hours. Lipid Profile: No results for input(s): CHOL, HDL, LDLCALC, TRIG, CHOLHDL, LDLDIRECT in the last 72 hours. Thyroid Function Tests: No results for input(s): TSH, T4TOTAL, FREET4, T3FREE, THYROIDAB in the last 72 hours. Anemia Panel: No results for input(s): VITAMINB12, FOLATE, FERRITIN, TIBC, IRON, RETICCTPCT in the last 72 hours. Urine analysis: No results found for: COLORURINE, APPEARANCEUR, LABSPEC, PHURINE, GLUCOSEU, HGBUR, BILIRUBINUR, KETONESUR, PROTEINUR, UROBILINOGEN, NITRITE, LEUKOCYTESUR  Radiological Exams on Admission: Dg Chest 2 View  Result Date: 01/19/2017 CLINICAL DATA:  Chest pain and tightness since last night. Mild shortness of breath. EXAM: CHEST  2 VIEW COMPARISON:  None. FINDINGS: Normal sized heart. Clear lungs with normal vascularity. Mild diffuse peribronchial thickening and accentuation of the interstitial markings. Thoracic spine degenerative changes. IMPRESSION: No acute abnormality.  Mild chronic bronchitic changes. Electronically Signed   By: Beckie Salts M.D.   On: 01/19/2017 19:23   Ct Angio Chest/abd/pel For Dissection W And/or W/wo  Result Date: 01/20/2017 CLINICAL DATA:  Chest pain starting last evening without relief from nitroglycerin. EXAM: CT ANGIOGRAPHY CHEST, ABDOMEN AND PELVIS TECHNIQUE: Multidetector CT imaging through the chest, abdomen and pelvis was performed using  the standard protocol during bolus administration of intravenous contrast. Multiplanar reconstructed images and MIPs were obtained and reviewed to evaluate the vascular anatomy. CONTRAST:  100 cc Isovue 370 IV COMPARISON:  06/01/2007 CT chest report FINDINGS: CTA CHEST FINDINGS Cardiovascular: No aortic aneurysm or dissection. No acute pulmonary embolus. Normal size heart without pericardial effusion. 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 Mediastinum/Nodes: Calcified mediastinal and hilar lymph nodes consistent with old granulomatous disease noted in the prevascular mediastinum, aorticopulmonary window and left hilum. Trachea and mainstem bronchi are patent. No thyroid abnormality. Esophagus is unremarkable. Lungs/Pleura: Lungs are clear. No pleural effusion or pneumothorax. Musculoskeletal: No chest wall abnormality. No acute or significant osseous findings. Thoracic spondylosis with mild disc space narrowing and multilevel anterior osteophyte formation. Review of the MIP images confirms the above findings. CTA ABDOMEN AND PELVIS FINDINGS VASCULAR Aorta: Normal caliber aorta without aneurysm, dissection, vasculitis or significant stenosis. Mild atherosclerosis. Celiac: Patent without evidence of aneurysm, dissection, vasculitis or significant stenosis. SMA: Patent without evidence of aneurysm, dissection, vasculitis or significant stenosis. Renals: Both renal arteries are patent without evidence of aneurysm, dissection, vasculitis, fibromuscular dysplasia or significant stenosis. IMA: Patent without evidence of aneurysm, dissection, vasculitis or significant stenosis. Inflow: Patent without evidence of aneurysm, dissection, vasculitis or significant stenosis. Veins: No obvious venous abnormality within the limitations of this arterial phase study. Review of the MIP images confirms the above findings. NON-VASCULAR Hepatobiliary: Nonspecific hypodensities in the left and right hepatic lobes consistent  statistically with cysts or hemangiomata, the largest is in the right hepatic lobe measuring approximately 2.8 x 2.6 x 2 cm. There is a 1.1 cm hypodensity in the left hepatic lobe. There is no biliary dilatation. Gallbladder is surgically absent. Pancreas: Atrophic appearance of the pancreas without ductal dilatation or mass. Spleen: Normal Adrenals/Urinary Tract: Normal bilateral adrenal glands. No renal cortical thinning, obstructive uropathy or enhancing mass. No hydroureteronephrosis. Urinary bladder is physiologically distended. Stomach/Bowel: Stomach is within normal limits. Appendix is not confidently identified however no findings of right lower quadrant or pericecal inflammation. No evidence of bowel wall thickening, distention, or inflammatory changes. Lymphatic: No lymphadenopathy. Reproductive: Prostate is unremarkable. Other: No abdominal wall hernia or  abnormality. No abdominopelvic ascites. Musculoskeletal: No acute or significant osseous findings. Review of the MIP images confirms the above findings. IMPRESSION: 1. No evidence of aortic aneurysm or dissection. 2. Mild aortic and branch vessel atherosclerosis. 3. No acute pulmonary embolus. 4. Clear lungs. 5. Nonspecific hepatic hypodensities statistically consistent with cysts or hemangiomas, the largest in the right hepatic lobe measuring 2.8 x 2.6 x 2 cm with a 1.1 cm left hepatic lobe hypodensity also noted. Electronically Signed   By: Tollie Eth M.D.   On: 01/20/2017 00:00    EKG: Independently reviewed. Sinus bradycardia, no ST or TW changes noted.   Assessment/Plan  Chest pain at rest - Monitor on telemetry - Trend Troponin - NTG and morphine for pain - AM EKG - NPO at 4 am for possible stress testing - Symptoms have typical and atypical features.  He has had nuclear stress testing in the past and requests to do exercise stress testing if needed - cardiology consult in the AM    Sinus Bradycardia, fatigue - Repeat TTE to  evaluate for worsening of valvular disease - Check TSH.   - He does not have any signs or symptoms of infection reported to me - Monitor on telemetry - Cardiology consult, ? Need for pacemaker.  May need exercise tolerance testing    Restless leg syndrome - Continue home clonazepam and gabapentin - Gabapentin has post marketing reports of bradycardia.  He is on a relatively low dose however    DVT prophylaxis: Lovenox Code Status: Full, confirmed with patient Disposition Plan: Admit for obs, discharge in 1-2 days Consults called: None, cards in the AM Admission status: Telemetry, observation  Debe Coder MD [facility] Pager [phone]  If 7PM-7AM, please contact night-coverage [email or web address] Password [email or web address]  01/20/2017, 1:32 AM

## 2017-01-20 NOTE — Progress Notes (Signed)
  Echocardiogram 2D Echocardiogram has been performed.  Jordan Hammond 01/20/2017, 5:05 PM

## 2017-01-20 NOTE — Progress Notes (Signed)
Patient has watched catheterization educational video with wife present. No questions or concerns at this time. Patient understands he should not eat or drink after midnight. Consent signed and placed in shadow chart.

## 2017-01-21 ENCOUNTER — Encounter (HOSPITAL_COMMUNITY)
Admission: EM | Disposition: A | Discharge status: Home / Self Care | Payer: Self-pay | Source: Home / Self Care | Attending: Cardiology

## 2017-01-21 ENCOUNTER — Other Ambulatory Visit: Payer: Self-pay

## 2017-01-21 DIAGNOSIS — I358 Other nonrheumatic aortic valve disorders: Secondary | ICD-10-CM | POA: Diagnosis not present

## 2017-01-21 DIAGNOSIS — I25110 Atherosclerotic heart disease of native coronary artery with unstable angina pectoris: Secondary | ICD-10-CM | POA: Diagnosis not present

## 2017-01-21 DIAGNOSIS — J939 Pneumothorax, unspecified: Secondary | ICD-10-CM | POA: Diagnosis not present

## 2017-01-21 DIAGNOSIS — I350 Nonrheumatic aortic (valve) stenosis: Secondary | ICD-10-CM

## 2017-01-21 DIAGNOSIS — J95811 Postprocedural pneumothorax: Secondary | ICD-10-CM | POA: Diagnosis not present

## 2017-01-21 DIAGNOSIS — I200 Unstable angina: Secondary | ICD-10-CM

## 2017-01-21 DIAGNOSIS — R079 Chest pain, unspecified: Secondary | ICD-10-CM

## 2017-01-21 DIAGNOSIS — K029 Dental caries, unspecified: Secondary | ICD-10-CM | POA: Diagnosis present

## 2017-01-21 DIAGNOSIS — Z79899 Other long term (current) drug therapy: Secondary | ICD-10-CM | POA: Diagnosis not present

## 2017-01-21 DIAGNOSIS — J90 Pleural effusion, not elsewhere classified: Secondary | ICD-10-CM | POA: Diagnosis not present

## 2017-01-21 DIAGNOSIS — R931 Abnormal findings on diagnostic imaging of heart and coronary circulation: Secondary | ICD-10-CM | POA: Diagnosis not present

## 2017-01-21 DIAGNOSIS — I2510 Atherosclerotic heart disease of native coronary artery without angina pectoris: Secondary | ICD-10-CM | POA: Diagnosis not present

## 2017-01-21 DIAGNOSIS — R0602 Shortness of breath: Secondary | ICD-10-CM | POA: Diagnosis not present

## 2017-01-21 DIAGNOSIS — I359 Nonrheumatic aortic valve disorder, unspecified: Secondary | ICD-10-CM | POA: Diagnosis not present

## 2017-01-21 DIAGNOSIS — D696 Thrombocytopenia, unspecified: Secondary | ICD-10-CM | POA: Diagnosis not present

## 2017-01-21 DIAGNOSIS — Z882 Allergy status to sulfonamides status: Secondary | ICD-10-CM | POA: Diagnosis not present

## 2017-01-21 DIAGNOSIS — R001 Bradycardia, unspecified: Secondary | ICD-10-CM | POA: Diagnosis not present

## 2017-01-21 DIAGNOSIS — Z4589 Encounter for adjustment and management of other implanted devices: Secondary | ICD-10-CM | POA: Diagnosis not present

## 2017-01-21 DIAGNOSIS — Z01818 Encounter for other preprocedural examination: Secondary | ICD-10-CM | POA: Diagnosis not present

## 2017-01-21 DIAGNOSIS — R072 Precordial pain: Secondary | ICD-10-CM | POA: Diagnosis present

## 2017-01-21 DIAGNOSIS — Z886 Allergy status to analgesic agent status: Secondary | ICD-10-CM | POA: Diagnosis not present

## 2017-01-21 DIAGNOSIS — E8770 Fluid overload, unspecified: Secondary | ICD-10-CM | POA: Diagnosis not present

## 2017-01-21 DIAGNOSIS — D62 Acute posthemorrhagic anemia: Secondary | ICD-10-CM | POA: Diagnosis not present

## 2017-01-21 DIAGNOSIS — Z01810 Encounter for preprocedural cardiovascular examination: Secondary | ICD-10-CM | POA: Diagnosis not present

## 2017-01-21 DIAGNOSIS — G2581 Restless legs syndrome: Secondary | ICD-10-CM | POA: Diagnosis not present

## 2017-01-21 DIAGNOSIS — K219 Gastro-esophageal reflux disease without esophagitis: Secondary | ICD-10-CM | POA: Diagnosis not present

## 2017-01-21 DIAGNOSIS — I700 Atherosclerosis of aorta: Secondary | ICD-10-CM | POA: Diagnosis not present

## 2017-01-21 DIAGNOSIS — I080 Rheumatic disorders of both mitral and aortic valves: Secondary | ICD-10-CM | POA: Diagnosis not present

## 2017-01-21 DIAGNOSIS — Y832 Surgical operation with anastomosis, bypass or graft as the cause of abnormal reaction of the patient, or of later complication, without mention of misadventure at the time of the procedure: Secondary | ICD-10-CM | POA: Diagnosis not present

## 2017-01-21 DIAGNOSIS — Z87442 Personal history of urinary calculi: Secondary | ICD-10-CM | POA: Diagnosis not present

## 2017-01-21 DIAGNOSIS — K048 Radicular cyst: Secondary | ICD-10-CM | POA: Diagnosis not present

## 2017-01-21 DIAGNOSIS — Z8249 Family history of ischemic heart disease and other diseases of the circulatory system: Secondary | ICD-10-CM | POA: Diagnosis not present

## 2017-01-21 DIAGNOSIS — K06010 Localized gingival recession, unspecified: Secondary | ICD-10-CM | POA: Diagnosis present

## 2017-01-21 DIAGNOSIS — E785 Hyperlipidemia, unspecified: Secondary | ICD-10-CM | POA: Diagnosis not present

## 2017-01-21 DIAGNOSIS — K0530 Chronic periodontitis, unspecified: Secondary | ICD-10-CM | POA: Diagnosis present

## 2017-01-21 HISTORY — PX: LEFT HEART CATH AND CORONARY ANGIOGRAPHY: CATH118249

## 2017-01-21 HISTORY — PX: RIGHT HEART CATH: CATH118263

## 2017-01-21 LAB — CBC
HCT: 42.2 % (ref 39.0–52.0)
HEMOGLOBIN: 14.4 g/dL (ref 13.0–17.0)
MCH: 31 pg (ref 26.0–34.0)
MCHC: 34.1 g/dL (ref 30.0–36.0)
MCV: 90.8 fL (ref 78.0–100.0)
Platelets: 177 10*3/uL (ref 150–400)
RBC: 4.65 MIL/uL (ref 4.22–5.81)
RDW: 12.9 % (ref 11.5–15.5)
WBC: 5.4 10*3/uL (ref 4.0–10.5)

## 2017-01-21 LAB — POCT I-STAT 3, ART BLOOD GAS (G3+)
ACID-BASE EXCESS: 1 mmol/L (ref 0.0–2.0)
Bicarbonate: 25.3 mmol/L (ref 20.0–28.0)
O2 SAT: 94 %
PO2 ART: 70 mmHg — AB (ref 83.0–108.0)
TCO2: 26 mmol/L (ref 0–100)
pCO2 arterial: 38.4 mmHg (ref 32.0–48.0)
pH, Arterial: 7.427 (ref 7.350–7.450)

## 2017-01-21 LAB — POCT I-STAT 3, VENOUS BLOOD GAS (G3P V)
Acid-Base Excess: 3 mmol/L — ABNORMAL HIGH (ref 0.0–2.0)
BICARBONATE: 27.6 mmol/L (ref 20.0–28.0)
O2 SAT: 69 %
PCO2 VEN: 42.3 mmHg — AB (ref 44.0–60.0)
PO2 VEN: 35 mmHg (ref 32.0–45.0)
TCO2: 29 mmol/L (ref 0–100)
pH, Ven: 7.423 (ref 7.250–7.430)

## 2017-01-21 LAB — PROTIME-INR
INR: 1.09
PROTHROMBIN TIME: 14.1 s (ref 11.4–15.2)

## 2017-01-21 LAB — POCT ACTIVATED CLOTTING TIME: ACTIVATED CLOTTING TIME: 125 s

## 2017-01-21 LAB — HEPARIN LEVEL (UNFRACTIONATED): HEPARIN UNFRACTIONATED: 0.77 [IU]/mL — AB (ref 0.30–0.70)

## 2017-01-21 SURGERY — LEFT HEART CATH AND CORONARY ANGIOGRAPHY
Anesthesia: LOCAL

## 2017-01-21 MED ORDER — SODIUM CHLORIDE 0.9% FLUSH: 3.0000 mL | INTRAVENOUS | Status: DC | PRN

## 2017-01-21 MED ORDER — MIDAZOLAM HCL 2 MG/2ML IJ SOLN
INTRAMUSCULAR | Status: DC | PRN
  Administered 2017-01-21: 1 mg via INTRAVENOUS

## 2017-01-21 MED ORDER — SODIUM CHLORIDE 0.9% FLUSH: 3.0000 mL | Freq: Two times a day (BID) | INTRAVENOUS | Status: DC

## 2017-01-21 MED ORDER — ASPIRIN 81 MG PO CHEW
81.0000 mg | CHEWABLE_TABLET | ORAL | Status: AC
  Administered 2017-01-21: 81 mg via ORAL
  Filled 2017-01-21: qty 1

## 2017-01-21 MED ORDER — MIDAZOLAM HCL 2 MG/2ML IJ SOLN
INTRAMUSCULAR | Status: AC
  Filled 2017-01-21: qty 2

## 2017-01-21 MED ORDER — IOPAMIDOL (ISOVUE-370) INJECTION 76%
INTRAVENOUS | Status: AC
  Filled 2017-01-21: qty 100

## 2017-01-21 MED ORDER — ACETAMINOPHEN 325 MG PO TABS
650.0000 mg | ORAL_TABLET | ORAL | Status: DC | PRN
  Administered 2017-01-22 – 2017-01-28 (×6): 650 mg via ORAL
  Filled 2017-01-21 (×6): qty 2

## 2017-01-21 MED ORDER — MORPHINE SULFATE (PF) 4 MG/ML IV SOLN: 2.0000 mg | INTRAVENOUS | Status: DC | PRN

## 2017-01-21 MED ORDER — HEPARIN (PORCINE) IN NACL 2-0.9 UNIT/ML-% IJ SOLN
INTRAMUSCULAR | Status: AC
  Filled 2017-01-21: qty 1000

## 2017-01-21 MED ORDER — ONDANSETRON HCL 4 MG/2ML IJ SOLN: 4.0000 mg | Freq: Four times a day (QID) | INTRAMUSCULAR | Status: DC | PRN

## 2017-01-21 MED ORDER — LIDOCAINE HCL 1 % IJ SOLN
INTRAMUSCULAR | Status: AC
  Filled 2017-01-21: qty 20

## 2017-01-21 MED ORDER — IOPAMIDOL (ISOVUE-370) INJECTION 76%
INTRAVENOUS | Status: DC | PRN
  Administered 2017-01-21: 40 mL via INTRA_ARTERIAL

## 2017-01-21 MED ORDER — HEPARIN (PORCINE) IN NACL 2-0.9 UNIT/ML-% IJ SOLN
INTRAMUSCULAR | Status: AC | PRN
  Administered 2017-01-21: 1000 mL

## 2017-01-21 MED ORDER — SODIUM CHLORIDE 0.9 % WEIGHT BASED INFUSION: 1.0000 mL/kg/h | INTRAVENOUS | Status: DC

## 2017-01-21 MED ORDER — FENTANYL CITRATE (PF) 100 MCG/2ML IJ SOLN
INTRAMUSCULAR | Status: DC | PRN
Start: 2017-01-21 — End: 2017-01-21
  Administered 2017-01-21: 25 ug via INTRAVENOUS

## 2017-01-21 MED ORDER — SODIUM CHLORIDE 0.9% FLUSH
3.0000 mL | Freq: Two times a day (BID) | INTRAVENOUS | Status: DC
Start: 2017-01-21 — End: 2017-01-29
  Administered 2017-01-22 – 2017-01-28 (×12): 3 mL via INTRAVENOUS

## 2017-01-21 MED ORDER — FENTANYL CITRATE (PF) 100 MCG/2ML IJ SOLN
INTRAMUSCULAR | Status: AC
  Filled 2017-01-21: qty 2

## 2017-01-21 MED ORDER — SODIUM CHLORIDE 0.9 % IV SOLN: 250.0000 mL | INTRAVENOUS | Status: DC | PRN

## 2017-01-21 MED ORDER — SODIUM CHLORIDE 0.9 % IV SOLN
INTRAVENOUS | Status: AC
  Administered 2017-01-21: 16:00:00 via INTRAVENOUS

## 2017-01-21 MED ORDER — HEPARIN (PORCINE) IN NACL 100-0.45 UNIT/ML-% IJ SOLN
950.0000 [IU]/h | INTRAMUSCULAR | Status: DC
  Administered 2017-01-21: 900 [IU]/h via INTRAVENOUS
  Administered 2017-01-22 – 2017-01-28 (×5): 950 [IU]/h via INTRAVENOUS
  Filled 2017-01-21 (×6): qty 250

## 2017-01-21 MED ORDER — LIDOCAINE HCL (PF) 1 % IJ SOLN
INTRAMUSCULAR | Status: DC | PRN
  Administered 2017-01-21 (×2): 15 mL

## 2017-01-21 MED ORDER — SODIUM CHLORIDE 0.9 % WEIGHT BASED INFUSION: 3.0000 mL/kg/h | INTRAVENOUS | Status: DC

## 2017-01-21 SURGICAL SUPPLY — 14 items
CATH INFINITI 5FR AL1 (CATHETERS) ×2 IMPLANT
CATH INFINITI 5FR MULTPACK ANG (CATHETERS) ×2 IMPLANT
CATH SWAN GANZ 7F STRAIGHT (CATHETERS) ×2 IMPLANT
KIT HEART LEFT (KITS) ×2 IMPLANT
NEEDLE THINWALL 18G (NEEDLE) ×2 IMPLANT
PACK CARDIAC CATHETERIZATION (CUSTOM PROCEDURE TRAY) ×2 IMPLANT
SHEATH PINNACLE 5F 10CM (SHEATH) IMPLANT
SHEATH PINNACLE 6F 10CM (SHEATH) ×2 IMPLANT
SHEATH PINNACLE 7F 10CM (SHEATH) ×2 IMPLANT
TRANSDUCER W/STOPCOCK (MISCELLANEOUS) ×4 IMPLANT
TUBING CIL FLEX 10 FLL-RA (TUBING) ×2 IMPLANT
WIRE EMERALD 3MM-J .025X260CM (WIRE) ×2 IMPLANT
WIRE EMERALD 3MM-J .035X150CM (WIRE) ×2 IMPLANT
WIRE EMERALD ST .035X150CM (WIRE) ×2 IMPLANT

## 2017-01-21 NOTE — Care Management Obs Status (Signed)
MEDICARE OBSERVATION STATUS NOTIFICATION   Patient Details  Name: Jordan Hammond MRN: [id number] Date of Birth: 1940/07/19   Medicare Observation Status Notification Given:  Yes    Durenda GuthrieBrady, Aveline Daus Naomi, RN 01/21/2017, 11:50 AM

## 2017-01-21 NOTE — Progress Notes (Signed)
Removal of Right femoral arterial and venous without complication. Manual compression applied for 30 minutes. Bp: 156/73, HR: 58, Sp02: 100%. Distal pulses palpable +2. Sterile 4x4 gauze applied to puncture site with tegaderm to secure. Right groin palpable with no oozing, bruising or hematoma present. Reviewed post recovery instructions with patient and he was able to verbally repeat care. Dressing dry and intact upon departure form Cath Lab recovery room.

## 2017-01-21 NOTE — Progress Notes (Signed)
Subjective:  Currently on IV heparin.  No recurrent chest pain overnight.  Repeat EKG was normal.  Objective:  Vital Signs in the last 24 hours: BP 98/76 (BP Location: Right Arm)   Pulse 64   Temp 97.8 F (36.6 C) (Oral)   Resp 18   Ht 6' (1.829 m)   Wt 82.1 kg (181 lb 1.6 oz)   SpO2 94%   BMI 24.56 kg/m   Physical Exam: Pleasant male in no acute distress Lungs:  Clear Cardiac:  Regular rhythm, normal S1 , preserved S2, 2/6 early to midsystolic murmur of aortic stenosis radiating to carotids Extremities:  No edema present  Intake/Output from previous day: 08/12 0701 - 08/13 0700 In: 559.4 [P.O.:480; I.V.:79.4] Out: 750 [Urine:750]  Weight Filed Weights   01/19/17 1823 01/20/17 0912 01/21/17 0625  Weight: 82.6 kg (182 lb) 84 kg (185 lb 3.2 oz) 82.1 kg (181 lb 1.6 oz)    Lab Results: Basic Metabolic Panel:  Recent Labs  40/34/7407/04/29 2122 01/20/17 0439 01/20/17 1252  NA 136  --  137  K 3.8  --  3.6  CL 103  --  102  CO2 26  --  24  GLUCOSE 93  --  80  BUN 12  --  10  CREATININE 1.11 1.12 1.11   CBC:  Recent Labs  01/20/17 0439 01/21/17 0411  WBC 5.0 5.4  HGB 13.6 14.4  HCT 39.7 42.2  MCV 91.1 90.8  PLT 167 177   Cardiac Enzymes: Troponin (Point of Care Test)  Recent Labs  01/19/17 2123  TROPIPOC 0.00   Cardiac Panel (last 3 results)  Recent Labs  01/20/17 0439 01/20/17 0807 01/20/17 1033  TROPONINI <0.03 <0.03 <0.03    Telemetry: Normal sinus rhythm personally reviewed  Assessment/Plan:  1.  Chest pain suggestive of unstable angina 2.  Moderate to severe aortic stenosis with increasing gradient since October of last year 3.  Hyperlipidemia with statin intolerance  Recommendations:  Awaiting catheterization later this afternoon.  If has significant coronary disease likely will need combined aortic valve replacement and CABG unless single vessel.  If no significant CAD noted then we'll need to decide about TAVR versus surgical valve  replacement.      Darden PalmerW. Spencer Felma Pfefferle, Jr.  MD [facility]  01/21/2017, 9:17 AM

## 2017-01-21 NOTE — Progress Notes (Signed)
PROGRESS NOTE                                                                                                                                                                                                             Patient Demographics:    Jordan Hammond, is a 76 y.o. male, DOB - 01-31-1941, AVW:[id number]  Admit date - 01/19/2017   Admitting Physician Inez Catalina, MD  Outpatient Primary MD for the patient is System, Pcp Not In  LOS - 0  Outpatient Specialists:Dr. Donnie Aho  Chief Complaint  Patient presents with  . Chest Pain       Brief Narrative   76 year old male wit history of heart murmur, RLS, GERD status post Nissen fundoplication presented with substernal chest pain irradiating to the sides and to the back with tightness, improved with nitroglycerin and aspirin. Symptoms occurred at rest. Reports having similar less intense symptoms in the past. Reports having stress test sometime in 2010. Also reports intentional weight loss in past 2 months. Patient denies any shortness of breath, diaphoresis, palpitations, nausea, vomiting, abdominal pain, fevers, chills, bowel or urinary symptoms. No sick contact or recent travel. No new medications. In the ED he was found to be bradycardic in the low 50s and occasionally in the 40s. EKG showed sinus rhythm with poor wave progression and initial troponin was negative. Patient placed in observation for ACS rule out.   Subjective:   Denies further chest pain symptoms. Started on heparin drip for unstable angina.   Assessment  & Plan :    Active Problems: Unstable angina. No further chest pain symptoms and stable on telemetry. Serial troponins have been negative. Started on heparin drip. Cardiac consult appreciated. Plan on cardiac cath this afternoon. 2-D echo shows normal LVEF of 60-65%, no wall motion abnormality and moderate to severe aortic stenosis. Patient allergy to  full dose aspirin and beta blocker not initiated due to bradycardia on presentation.    Bradycardia Heart rate in the 40s on presentation. Avoiding AV nodal blocking agents. TSH is normal. 2-D echo reviewed.    Restless leg syndrome Continue Neurontin and Klonopin      Code Status : Full code  Family Communication  : None at bedside  Disposition Plan  : Pending cardiac cath today  Barriers For Discharge : Active symptoms  Consults  :  Cardiology  Procedures  :  2-D echo Cardiac cath  DVT Prophylaxis  :  Lovenox -   Lab Results  Component Value Date   PLT 177 01/21/2017    Antibiotics  :    Anti-infectives    None        Objective:   Vitals:   01/20/17 0912 01/20/17 1306 01/20/17 1957 01/21/17 0625  BP: 102/61 125/71 (!) 97/56 98/76  Pulse: (!) 58 61 66 64  Resp: 16 18 18 18   Temp: 97.6 F (36.4 C) 97.8 F (36.6 C) 97.9 F (36.6 C) 97.8 F (36.6 C)  TempSrc: Oral Oral Oral Oral  SpO2: 100% 97% 96% 94%  Weight: 84 kg (185 lb 3.2 oz)   82.1 kg (181 lb 1.6 oz)  Height: 6' (1.829 m)       Wt Readings from Last 3 Encounters:  01/21/17 82.1 kg (181 lb 1.6 oz)     Intake/Output Summary (Last 24 hours) at 01/21/17 1026 Last data filed at 01/21/17 0631  Gross per 24 hour  Intake           559.38 ml  Output              750 ml  Net          -190.62 ml     Physical Exam Gen.: Elderly male not in distress HEENT: Moist mucosa, supple Chest: Clear bilaterally CVS: Normal S1 and S2, systolic murmur 3/6 GI: Soft, nondistended, nontender  Musculoskeletal: Warm, no edema    Data Review:    CBC  Recent Labs Lab 01/19/17 2122 01/20/17 0439 01/21/17 0411  WBC 5.1 5.0 5.4  HGB 13.2 13.6 14.4  HCT 38.3* 39.7 42.2  PLT 166 167 177  MCV 90.8 91.1 90.8  MCH 31.3 31.2 31.0  MCHC 34.5 34.3 34.1  RDW 12.8 12.9 12.9    Chemistries   Recent Labs Lab 01/19/17 2122 01/20/17 0439 01/20/17 1252  NA 136  --  137  K 3.8  --  3.6  CL 103  --  102    CO2 26  --  24  GLUCOSE 93  --  80  BUN 12  --  10  CREATININE 1.11 1.12 1.11  CALCIUM 8.8*  --  9.3  AST  --   --  24  ALT  --   --  19  ALKPHOS  --   --  44  BILITOT  --   --  1.2   ------------------------------------------------------------------------------------------------------------------  Recent Labs  01/20/17 1252  CHOL 169  HDL 48  LDLCALC 103*  TRIG 92  CHOLHDL 3.5    No results found for: HGBA1C ------------------------------------------------------------------------------------------------------------------  Recent Labs  01/20/17 0439  TSH 2.757   ------------------------------------------------------------------------------------------------------------------ No results for input(s): VITAMINB12, FOLATE, FERRITIN, TIBC, IRON, RETICCTPCT in the last 72 hours.  Coagulation profile  Recent Labs Lab 01/21/17 0411  INR 1.09    No results for input(s): DDIMER in the last 72 hours.  Cardiac Enzymes  Recent Labs Lab 01/20/17 0439 01/20/17 0807 01/20/17 1033  TROPONINI <0.03 <0.03 <0.03   ------------------------------------------------------------------------------------------------------------------ No results found for: BNP  Inpatient Medications  Scheduled Meds: . aspirin EC  81 mg Oral Daily  . calcium-vitamin D  1 tablet Oral Q breakfast  . clonazePAM  1 mg Oral QHS  . gabapentin  300 mg Oral QHS  . sodium chloride flush  3 mL Intravenous Q12H   Continuous Infusions: . sodium chloride    . [START ON  01/22/2017] sodium chloride     Followed by  . [START ON 01/22/2017] sodium chloride    . heparin 900 Units/hr (01/21/17 0631)   PRN Meds:.sodium chloride, acetaminophen, gi cocktail, morphine injection, nitroGLYCERIN, ondansetron (ZOFRAN) IV, sodium chloride flush  Micro Results No results found for this or any previous visit (from the past 240 hour(s)).  Radiology Reports Dg Chest 2 View  Result Date: 01/19/2017 CLINICAL DATA:   Chest pain and tightness since last night. Mild shortness of breath. EXAM: CHEST  2 VIEW COMPARISON:  None. FINDINGS: Normal sized heart. Clear lungs with normal vascularity. Mild diffuse peribronchial thickening and accentuation of the interstitial markings. Thoracic spine degenerative changes. IMPRESSION: No acute abnormality.  Mild chronic bronchitic changes. Electronically Signed   By: Beckie Salts M.D.   On: 01/19/2017 19:23   Ct Angio Chest/abd/pel For Dissection W And/or W/wo  Result Date: 01/20/2017 CLINICAL DATA:  Chest pain starting last evening without relief from nitroglycerin. EXAM: CT ANGIOGRAPHY CHEST, ABDOMEN AND PELVIS TECHNIQUE: Multidetector CT imaging through the chest, abdomen and pelvis was performed using the standard protocol during bolus administration of intravenous contrast. Multiplanar reconstructed images and MIPs were obtained and reviewed to evaluate the vascular anatomy. CONTRAST:  100 cc Isovue 370 IV COMPARISON:  06/01/2007 CT chest report FINDINGS: CTA CHEST FINDINGS Cardiovascular: No aortic aneurysm or dissection. No acute pulmonary embolus. Normal size heart without pericardial effusion. 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 Mediastinum/Nodes: Calcified mediastinal and hilar lymph nodes consistent with old granulomatous disease noted in the prevascular mediastinum, aorticopulmonary window and left hilum. Trachea and mainstem bronchi are patent. No thyroid abnormality. Esophagus is unremarkable. Lungs/Pleura: Lungs are clear. No pleural effusion or pneumothorax. Musculoskeletal: No chest wall abnormality. No acute or significant osseous findings. Thoracic spondylosis with mild disc space narrowing and multilevel anterior osteophyte formation. Review of the MIP images confirms the above findings. CTA ABDOMEN AND PELVIS FINDINGS VASCULAR Aorta: Normal caliber aorta without aneurysm, dissection, vasculitis or significant stenosis. Mild atherosclerosis. Celiac:  Patent without evidence of aneurysm, dissection, vasculitis or significant stenosis. SMA: Patent without evidence of aneurysm, dissection, vasculitis or significant stenosis. Renals: Both renal arteries are patent without evidence of aneurysm, dissection, vasculitis, fibromuscular dysplasia or significant stenosis. IMA: Patent without evidence of aneurysm, dissection, vasculitis or significant stenosis. Inflow: Patent without evidence of aneurysm, dissection, vasculitis or significant stenosis. Veins: No obvious venous abnormality within the limitations of this arterial phase study. Review of the MIP images confirms the above findings. NON-VASCULAR Hepatobiliary: Nonspecific hypodensities in the left and right hepatic lobes consistent statistically with cysts or hemangiomata, the largest is in the right hepatic lobe measuring approximately 2.8 x 2.6 x 2 cm. There is a 1.1 cm hypodensity in the left hepatic lobe. There is no biliary dilatation. Gallbladder is surgically absent. Pancreas: Atrophic appearance of the pancreas without ductal dilatation or mass. Spleen: Normal Adrenals/Urinary Tract: Normal bilateral adrenal glands. No renal cortical thinning, obstructive uropathy or enhancing mass. No hydroureteronephrosis. Urinary bladder is physiologically distended. Stomach/Bowel: Stomach is within normal limits. Appendix is not confidently identified however no findings of right lower quadrant or pericecal inflammation. No evidence of bowel wall thickening, distention, or inflammatory changes. Lymphatic: No lymphadenopathy. Reproductive: Prostate is unremarkable. Other: No abdominal wall hernia or abnormality. No abdominopelvic ascites. Musculoskeletal: No acute or significant osseous findings. Review of the MIP images confirms the above findings. IMPRESSION: 1. No evidence of aortic aneurysm or dissection. 2. Mild aortic and branch vessel atherosclerosis. 3. No acute pulmonary embolus. 4. Clear lungs.  5. Nonspecific  hepatic hypodensities statistically consistent with cysts or hemangiomas, the largest in the right hepatic lobe measuring 2.8 x 2.6 x 2 cm with a 1.1 cm left hepatic lobe hypodensity also noted. Electronically Signed   By: Tollie Ethavid  Kwon M.D.   On: 01/20/2017 00:00    Time Spent in minutes  25   Eddie NorthHUNGEL, Tajana Crotteau M.D on 01/21/2017 at 10:26 AM  Between 7am to 7pm - Pager - [phone]  After 7pm go to [email or web address] - password [email or web address]  [facility] -  Office  [phone]

## 2017-01-21 NOTE — Progress Notes (Signed)
ANTICOAGULATION CONSULT NOTE  Pharmacy Consult for Heparin Indication: chest pain/ACS post-cath  Allergies  Allergen Reactions  . Aspirin Nausea And Vomiting    Very heavy doses  . Erythromycin Nausea And Vomiting    Unsure if there are other reactions  . Ibuprofen Nausea And Vomiting    Very heavy doses  . Nabumetone Nausea And Vomiting  . Nsaids Nausea And Vomiting    Very heavy doses  . Sulfa Antibiotics   . Tetracycline Nausea And Vomiting    Unsure if there are other reactions  . Sulfamethoxazole Itching, Rash and Swelling  . Sulfur Itching, Rash and Swelling    Patient Measurements: Height: 6' (182.9 cm) Weight: 181 lb 1.6 oz (82.1 kg) IBW/kg (Calculated) : 77.6  Heparin Dosing Weight: 84kg   Vital Signs: Temp: 98 F (36.7 C) (08/13 1600) Temp Source: Oral (08/13 1600) BP: 133/75 (08/13 1600) Pulse Rate: 62 (08/13 1600)  Labs:  Recent Labs  01/19/17 2122 01/20/17 0439 01/20/17 0807 01/20/17 1033 01/20/17 1252 01/20/17 2131 01/21/17 0411  HGB 13.2 13.6  --   --   --   --  14.4  HCT 38.3* 39.7  --   --   --   --  42.2  PLT 166 167  --   --   --   --  177  LABPROT  --   --   --   --   --   --  14.1  INR  --   --   --   --   --   --  1.09  HEPARINUNFRC  --   --   --   --   --  0.76* 0.77*  CREATININE 1.11 1.12  --   --  1.11  --   --   TROPONINI  --  <0.03 <0.03 <0.03  --   --   --     Estimated Creatinine Clearance: 62.1 mL/min (by C-G formula based on SCr of 1.11 mg/dL).  Assessment: Jordan Hammond to begin heparin for unstable angina. He is now s/p cath and plans are for CABG and possible AVR, to restart heparin. Sheath removed at 1440.  Heparin level prior to cath was elevated at 0.77 units/mL on 1000 units/hr- rate was lowered to 900 units/hr, but no level checked on this rate.  Goal of Therapy:  Heparin level 0.3-0.7 units/ml Monitor platelets by anticoagulation protocol: Yes   Plan:  Restart heparin gtt at 900 units/hr at 2200 tonight  Daily  heparin level and CBC Monitor for s/s bleeding  Aimy Sweeting D. Paitynn Mikus, PharmD, BCPS Clinical Pharmacist Pager: [phone] 01/21/2017 5:20 PM

## 2017-01-21 NOTE — Interval H&P Note (Signed)
Cath Lab Visit (complete for each Cath Lab visit)  Clinical Evaluation Leading to the Procedure:   ACS: Yes.    Non-ACS:    Anginal Classification: CCS III  Anti-ischemic medical therapy: No Therapy  Non-Invasive Test Results: No non-invasive testing performed  Prior CABG: No previous CABG      History and Physical Interval Note:  01/21/2017 1:42 PM  Jordan Hammond  has presented today for surgery, with the diagnosis of unstable angina  The various methods of treatment have been discussed with the patient and family. After consideration of risks, benefits and other options for treatment, the patient has consented to  Procedure(s): LEFT HEART CATH AND CORONARY ANGIOGRAPHY (N/A) as a surgical intervention .  The patient's history has been reviewed, patient examined, no change in status, stable for surgery.  I have reviewed the patient's chart and labs.  Questions were answered to the patient's satisfaction.     Nanetta BattyBerry, Jonathan

## 2017-01-21 NOTE — Progress Notes (Signed)
ANTICOAGULATION CONSULT NOTE Pharmacy Consult for Heparin Indication: chest pain/ACS  Allergies  Allergen Reactions  . Aspirin Nausea And Vomiting    Very heavy doses  . Erythromycin Nausea And Vomiting    Unsure if there are other reactions  . Ibuprofen Nausea And Vomiting    Very heavy doses  . Nabumetone Nausea And Vomiting  . Nsaids Nausea And Vomiting    Very heavy doses  . Sulfa Antibiotics   . Tetracycline Nausea And Vomiting    Unsure if there are other reactions  . Sulfamethoxazole Itching, Rash and Swelling  . Sulfur Itching, Rash and Swelling    Patient Measurements: Height: 6' (182.9 cm) Weight: 185 lb 3.2 oz (84 kg) IBW/kg (Calculated) : 77.6  HDW: 84kg   Vital Signs: Temp: 97.9 F (36.6 C) (08/12 1957) Temp Source: Oral (08/12 1957) BP: 97/56 (08/12 1957) Pulse Rate: 66 (08/12 1957)  Labs:  Recent Labs  01/19/17 2122 01/20/17 0439 01/20/17 0807 01/20/17 1033 01/20/17 1252 01/20/17 2131 01/21/17 0411  HGB 13.2 13.6  --   --   --   --  14.4  HCT 38.3* 39.7  --   --   --   --  42.2  PLT 166 167  --   --   --   --  177  LABPROT  --   --   --   --   --   --  14.1  INR  --   --   --   --   --   --  1.09  HEPARINUNFRC  --   --   --   --   --  0.76* 0.77*  CREATININE 1.11 1.12  --   --  1.11  --   --   TROPONINI  --  <0.03 <0.03 <0.03  --   --   --     Estimated Creatinine Clearance: 62.1 mL/min (by C-G formula based on SCr of 1.11 mg/dL).  Assessment: 76yom to begin heparin for unstable angina. Heparin level remains supratherapeutic after previous dose reduction. CBC is stable, no s/s bleeding per RN.   Goal of Therapy:  Heparin level 0.3-0.7 units/ml Monitor platelets by anticoagulation protocol: Yes   Plan:  Decrease heparin gtt to 900 units/hr  Heparin level in 8 hours  Daily heparin level and CBC Monitor for s/s bleeding  York CeriseKatherine Cook, PharmD Clinical Pharmacist 01/21/17 5:56 AM

## 2017-01-21 NOTE — Progress Notes (Signed)
Patient doing well after catheterization.  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  It's hard for me to tell how severe the left main stenosis is however it appears ulcerated.  RCA has scattered irregularities.  He has significant aortic stenosis by echocardiogram although it appears the peak to peak gradient at catheterization is around 20 mm.  I think however he is going to need to have both an aortic valve as well as bypass grafting.  I re-looked at the echo he does have significant calcified and thickened aortic valve with some diminished leaflet opening and the gradient appears to be significant by echo.  Awaiting consult from cardiovascular surgery.  Darden PalmerW. Spencer Manning Luna, Jr. MD [facility] LejeuneFACC 7:20 PM

## 2017-01-21 NOTE — H&P (View-Only) (Signed)
Subjective:  Currently on IV heparin.  No recurrent chest pain overnight.  Repeat EKG was normal.  Objective:  Vital Signs in the last 24 hours: BP 98/76 (BP Location: Right Arm)   Pulse 64   Temp 97.8 F (36.6 C) (Oral)   Resp 18   Ht 6' (1.829 m)   Wt 82.1 kg (181 lb 1.6 oz)   SpO2 94%   BMI 24.56 kg/m   Physical Exam: Pleasant male in no acute distress Lungs:  Clear Cardiac:  Regular rhythm, normal S1 , preserved S2, 2/6 early to midsystolic murmur of aortic stenosis radiating to carotids Extremities:  No edema present  Intake/Output from previous day: 08/12 0701 - 08/13 0700 In: 559.4 [P.O.:480; I.V.:79.4] Out: 750 [Urine:750]  Weight Filed Weights   01/19/17 1823 01/20/17 0912 01/21/17 0625  Weight: 82.6 kg (182 lb) 84 kg (185 lb 3.2 oz) 82.1 kg (181 lb 1.6 oz)    Lab Results: Basic Metabolic Panel:  Recent Labs  01/19/17 2122 01/20/17 0439 01/20/17 1252  NA 136  --  137  K 3.8  --  3.6  CL 103  --  102  CO2 26  --  24  GLUCOSE 93  --  80  BUN 12  --  10  CREATININE 1.11 1.12 1.11   CBC:  Recent Labs  01/20/17 0439 01/21/17 0411  WBC 5.0 5.4  HGB 13.6 14.4  HCT 39.7 42.2  MCV 91.1 90.8  PLT 167 177   Cardiac Enzymes: Troponin (Point of Care Test)  Recent Labs  01/19/17 2123  TROPIPOC 0.00   Cardiac Panel (last 3 results)  Recent Labs  01/20/17 0439 01/20/17 0807 01/20/17 1033  TROPONINI <0.03 <0.03 <0.03    Telemetry: Normal sinus rhythm personally reviewed  Assessment/Plan:  1.  Chest pain suggestive of unstable angina 2.  Moderate to severe aortic stenosis with increasing gradient since October of last year 3.  Hyperlipidemia with statin intolerance  Recommendations:  Awaiting catheterization later this afternoon.  If has significant coronary disease likely will need combined aortic valve replacement and CABG unless single vessel.  If no significant CAD noted then we'll need to decide about TAVR versus surgical valve  replacement.      W. Spencer Kajal Scalici, Jr.  MD FACC Cardiology  01/21/2017, 9:17 AM   

## 2017-01-22 ENCOUNTER — Other Ambulatory Visit: Payer: Self-pay | Admitting: *Deleted

## 2017-01-22 ENCOUNTER — Encounter (HOSPITAL_COMMUNITY): Payer: Self-pay | Admitting: Cardiovascular Disease

## 2017-01-22 ENCOUNTER — Inpatient Hospital Stay (HOSPITAL_COMMUNITY): Payer: Medicare HMO

## 2017-01-22 DIAGNOSIS — I350 Nonrheumatic aortic (valve) stenosis: Secondary | ICD-10-CM

## 2017-01-22 DIAGNOSIS — I2510 Atherosclerotic heart disease of native coronary artery without angina pectoris: Secondary | ICD-10-CM

## 2017-01-22 DIAGNOSIS — I25110 Atherosclerotic heart disease of native coronary artery with unstable angina pectoris: Secondary | ICD-10-CM

## 2017-01-22 LAB — BASIC METABOLIC PANEL WITH GFR
Anion gap: 8 (ref 5–15)
BUN: 11 mg/dL (ref 6–20)
CO2: 26 mmol/L (ref 22–32)
Calcium: 9.3 mg/dL (ref 8.9–10.3)
Chloride: 105 mmol/L (ref 101–111)
Creatinine, Ser: 1.06 mg/dL (ref 0.61–1.24)
GFR calc Af Amer: >60 mL/min
GFR calc non Af Amer: >60 mL/min
Glucose, Bld: 96 mg/dL (ref 65–99)
Potassium: 3.9 mmol/L (ref 3.5–5.1)
Sodium: 139 mmol/L (ref 135–145)

## 2017-01-22 LAB — CBC
HCT: 44.3 % (ref 39.0–52.0)
Hemoglobin: 15 g/dL (ref 13.0–17.0)
MCH: 30.6 pg (ref 26.0–34.0)
MCHC: 33.9 g/dL (ref 30.0–36.0)
MCV: 90.4 fL (ref 78.0–100.0)
Platelets: 193 K/uL (ref 150–400)
RBC: 4.9 MIL/uL (ref 4.22–5.81)
RDW: 12.8 % (ref 11.5–15.5)
WBC: 5.7 K/uL (ref 4.0–10.5)

## 2017-01-22 LAB — HEPARIN LEVEL (UNFRACTIONATED)
Heparin Unfractionated: 0.26 IU/mL — ABNORMAL LOW (ref 0.30–0.70)
Heparin Unfractionated: 0.38 [IU]/mL (ref 0.30–0.70)
Heparin Unfractionated: 0.46 IU/mL (ref 0.30–0.70)

## 2017-01-22 NOTE — Consult Note (Signed)
[address]       [place] [address]             [phone]        [facility] Medical Record #[id number] Date of Birth: Jan 29, 1941  Referring: Dr. Ellwood Handler Primary Care: System, Pcp Not In  Chief Complaint:    Chief Complaint  Patient presents with  . Chest Pain    History of Present Illness:     [name] is a 76 year old male with a past medical history of Aortic stenosis, Bradycardia, kidney stones, RLS, Previous bowel obstruction with extensive surgery and GERD s/p nissan fundiplication who presented to the hospital with chest pain times a few days. The pain started on Friday night and was described as tightness with radiation to his back. It improved with ASA and nitro. The following morning he had another episode of chest pain and decided to call EMS. His symptoms were at rest. He has lost 20 lbs in the last 2 months on purpose by working on his nutrition and exercise. He does have some fatigue and lightheadedness which has become more frequent over the last few months. He has a known heart murmur and sees Dr. Donnie Aho outpatient. His most recent echocardiogram showed: The aortic valve was moderately restricted. There was moderate to severe stenosis and mild regurgitation. Ejection fraction was 60-65%. He then underwent a cardiac catheterization which showed 70% stenosis of the ostial left main, 70% stenosis of the mid LAD, ostial circ to prox circ is 50% stenosed, and ostial 1st marginal to 1st marginal is 80% stenosed. We are consulted for possible CABG/AVR. It does not appear the patient has received any Plavix.   His father had bypass surgery back in the 1970s, he is deceased. Two other uncles on his father's side had bypass surgery, one died suddenly at age 107 of unknown causes.    Current Activity/ Functional Status: Patient was independent with mobility/ambulation, transfers, ADL's, IADL's.   Zubrod Score: At the time of  surgery this patient's most appropriate activity status/level should be described as: []     0    Normal activity, no symptoms [x]     1    Restricted in physical strenuous activity but ambulatory, able to do out light work []     2    Ambulatory and capable of self care, unable to do work activities, up and about                 more than 50%  Of the time                            []     3    Only limited self care, in bed greater than 50% of waking hours []     4    Completely disabled, no self care, confined to bed or chair []     5    Moribund  Past Medical History:  Diagnosis Date  . Aortic atherosclerosis (HCC) 01/20/2017  . Aortic stenosis 01/20/2017  . CAD (coronary artery disease), native coronary artery 01/20/2017  . GERD (gastroesophageal reflux disease)   . Hiatal hernia   . Hyperlipidemia   . Kidney stones   . Restless leg syndrome     Past Surgical History:  Procedure Laterality Date  . APPENDECTOMY    . bowel obstruction surgery    . CHOLECYSTECTOMY    .  KNEE ARTHROSCOPY    . lap nissan    . LEFT HEART CATH AND CORONARY ANGIOGRAPHY N/A 01/21/2017   Procedure: LEFT HEART CATH AND CORONARY ANGIOGRAPHY;  Surgeon: Runell Gess, MD;  Location: [facility] INVASIVE CV LAB;  Service: Cardiovascular;  Laterality: N/A;  . RIGHT HEART CATH N/A 01/21/2017   Procedure: RIGHT HEART CATH;  Surgeon: Runell Gess, MD;  Location: [facility] INVASIVE CV LAB;  Service: Cardiovascular;  Laterality: N/A;  . SHOULDER SURGERY      History  Smoking Status  . Never Smoker  Smokeless Tobacco  . Never Used    History  Alcohol Use No    Social History   Social History  . Marital status: Married    Spouse name: N/A  . Number of children: N/A  . Years of education: N/A   Occupational History  . Not on file.   Social History Main Topics  . Smoking status: Never Smoker  . Smokeless tobacco: Never Used  . Alcohol use No  . Drug use: No  . Sexual activity: Yes   Other Topics Concern  . Not  on file   Social History Narrative   Divorced, remarried.  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 Aspirin Nausea And Vomiting    Very heavy doses  . Erythromycin Nausea And Vomiting    Unsure if there are other reactions  . Ibuprofen Nausea And Vomiting    Very heavy doses  . Nabumetone Nausea And Vomiting  . Nsaids Nausea And Vomiting    Very heavy doses  . Sulfa Antibiotics   . Tetracycline Nausea And Vomiting    Unsure if there are other reactions  . Sulfamethoxazole Itching, Rash and Swelling  . Sulfur Itching, Rash and Swelling    Current Facility-Administered Medications  Medication Dose Route Frequency Provider Last Rate Last Dose  . 0.9 %  sodium chloride infusion  250 mL Intravenous PRN Runell Gess, MD      . acetaminophen (TYLENOL) tablet 650 mg  650 mg Oral Q4H PRN Runell Gess, MD   650 mg at 01/22/17 0842  . aspirin EC tablet 81 mg  81 mg Oral Daily Othella Boyer, MD   81 mg at 01/22/17 1610  . calcium-vitamin D (OSCAL WITH D) 500-200 MG-UNIT per tablet 1 tablet  1 tablet Oral Q breakfast Inez Catalina, MD   1 tablet at 01/22/17 [phone]  . clonazePAM (KLONOPIN) tablet 1 mg  1 mg Oral QHS Debe Coder B, MD   1 mg at 01/21/17 2127  . gabapentin (NEURONTIN) capsule 300 mg  300 mg Oral QHS Debe Coder B, MD   300 mg at 01/21/17 2127  . gi cocktail (Maalox,Lidocaine,Donnatal)  30 mL Oral QID PRN Debe Coder B, MD      . heparin ADULT infusion 100 units/mL (25000 units/232mL sodium chloride 0.45%)  950 Units/hr Intravenous Continuous Juliette Mangle, RPH 9.5 mL/hr at 01/22/17 0616 950 Units/hr at 01/22/17 0616  . morphine 4 MG/ML injection 2 mg  2 mg Intravenous Q1H PRN Runell Gess, MD      . nitroGLYCERIN (NITROSTAT) SL tablet 0.4 mg  0.4 mg Sublingual Q5 min PRN Dhungel, Nishant, MD      . ondansetron (ZOFRAN) injection 4 mg  4 mg Intravenous Q6H PRN Runell Gess, MD      .  sodium chloride flush (NS) 0.9 % injection  3 mL  3 mL Intravenous Q12H Runell Gess, MD   3 mL at 01/22/17 1000  . sodium chloride flush (NS) 0.9 % injection 3 mL  3 mL Intravenous PRN Runell Gess, MD        Prescriptions Prior to Admission  Medication Sig Dispense Refill Last Dose  . acetaminophen (TYLENOL) 500 MG tablet Take 1,000 mg by mouth every 6 (six) hours as needed for headache.   01/19/2017 at Unknown time  . calcium citrate-vitamin D (CALCIUM + D) 315-200 MG-UNIT tablet Take 1 tablet by mouth daily.   01/19/2017 at Unknown time  . Cholecalciferol (VITAMIN D3) 5000 units CAPS Take 5,000 Units by mouth daily.   01/19/2017 at Unknown time  . clonazePAM (KLONOPIN) 1 MG tablet Take 1 mg by mouth at bedtime.   01/19/2017 at Unknown time  . Flaxseed Oil OIL Take 1,000 mg by mouth daily.   01/19/2017 at Unknown time  . gabapentin (NEURONTIN) 300 MG capsule Take 300 mg by mouth daily.   01/19/2017 at Unknown time  . nitroGLYCERIN (NITROSTAT) 0.4 MG SL tablet Place 0.4 mg under the tongue daily as needed.   unknown at PRN    Family History  Problem Relation Age of Onset  . CAD Father        s/p triple bypass  . Dementia Mother      Review of Systems:  Pertinent items are noted in HPI.     Cardiac Review of Systems: Y or N  Chest Pain [ Y   ]  Resting SOB [ Y  ] Exertional SOB  [Y  ]  Orthopnea [  ]   Pedal Edema [ N  ]    Palpitations [ N ] Syncope  [ N ]   Presyncope Klaus.Mock   ]  General Review of Systems: [Y] = yes [  ]=no Constitional: recent weight change [ Y ]; anorexia [  ]; fatigue [ Y ]; nausea [ N ]; night sweats [  ]; fever [ N ]; or chills [  ]                                                               Dental: poor dentition[  ]; Last Dentist visit:   Eye : blurred vision [  ]; diplopia [   ]; vision changes [  ];  Amaurosis fugax[  ]; Resp: cough [ N ];  wheezing[ N ];  hemoptysis[  ]; shortness of breath[Y  ]; paroxysmal nocturnal dyspnea[  ]; dyspnea on exertion[ Y  ]; or orthopnea[  ];  GI:  gallstones[  ], vomiting[ N ];  dysphagia[  ]; melena[  ];  hematochezia [  ]; heartburn[  ];   Hx of  Colonoscopy[  ]; GU: kidney stones [ Y ]; hematuria[  ];   dysuria [  ];  nocturia[  ];  history of  obstruction [Y ]; urinary frequency [  ]             Skin: rash, swelling[  ];, hair loss[  ];  peripheral edema[ N ];  or itching[  ]; Musculosketetal: myalgias[  ];  joint swelling[  ];  joint erythema[  ];  joint pain[  ];  back pain[  ];  Heme/Lymph: bruising[  ];  bleeding[  ];  anemia[  ];  Neuro: TIA[  ];  headaches[  ];  stroke[N  ];  vertigo[  ];  seizures[  ];   paresthesias[  ];  difficulty walking[  ];  Psych:depression[ N ]; anxiety[ N ];  Endocrine: diabetes[N  ];  thyroid dysfunction[N  ];  Immunizations: Flu [  ]; Pneumococcal[  ];  Other:  Physical Exam: BP 91/70 (BP Location: Right Arm)   Pulse 72   Temp (!) 97.4 F (36.3 C) (Oral)   Resp 16   Ht 6' (1.829 m)   Wt 81.6 kg (179 lb 14.4 oz) Comment: b scale  SpO2 97%   BMI 24.40 kg/m    General appearance: alert, cooperative and no distress Resp: clear to auscultation bilaterally Cardio: RRR, 3/6 systolic murmur heard best along the left sternal boarder GI: soft, non-tender; bowel sounds normal; no masses,  no organomegaly Extremities: extremities normal, atraumatic, no cyanosis or edema Neurologic: Grossly normal   Echocardiogram:  Study Conclusions  - Left ventricle: The cavity size was normal. Wall thickness was   increased in a pattern of mild LVH. Systolic function was normal.   The estimated ejection fraction was in the range of 60% to 65%.   Wall motion was normal; there were no regional wall motion   abnormalities. - Aortic valve: Valve mobility was moderately restricted.   Transvalvular velocity was increased. There was moderate to   severe stenosis. There was mild regurgitation. Valve area (VTI):   0.63 cm^2. Valve area (Vmax): 0.63 cm^2. Valve area (Vmean): 0.65    cm^2. - Mitral valve: Valve area by pressure half-time: 1.98 cm^2.   Cardiac Cath:   Ost LM lesion, 70 %stenosed.  Mid LAD lesion, 70 %stenosed.  Ost Cx to Prox Cx lesion, 50 %stenosed.  Ost 1st Mrg to 1st Mrg lesion, 80 %stenosed.    Recent Radiology Findings:   CLINICAL DATA:  Chest pain and tightness since last night. Mild shortness of breath.  EXAM: CHEST  2 VIEW  COMPARISON:  None.  FINDINGS: Normal sized heart. Clear lungs with normal vascularity. Mild diffuse peribronchial thickening and accentuation of the interstitial markings. Thoracic spine degenerative changes.  IMPRESSION: No acute abnormality.  Mild chronic bronchitic changes.   Electronically Signed   By: Beckie SaltsSteven  Reid M.D.   On: 01/19/2017 19:23   I have independently reviewed the above radiologic studies.  Recent Lab Findings: Lab Results  Component Value Date   WBC 5.7 01/22/2017   HGB 15.0 01/22/2017   HCT 44.3 01/22/2017   PLT 193 01/22/2017   GLUCOSE 96 01/22/2017   CHOL 169 01/20/2017   TRIG 92 01/20/2017   HDL 48 01/20/2017   LDLCALC 103 (H) 01/20/2017   ALT 19 01/20/2017   AST 24 01/20/2017   NA 139 01/22/2017   K 3.9 01/22/2017   CL 105 01/22/2017   CREATININE 1.06 01/22/2017   BUN 11 01/22/2017   CO2 26 01/22/2017   TSH 2.757 01/20/2017   INR 1.09 01/21/2017      Assessment / Plan:   Plan for AVR/CABG on Tuesday 8/21 with Dr. Donata ClayVan Trigt. Continue IV heparin. The patient is currently chest pain free. The procedure was explained in detail to the patient. All questions were answered to the patient's satisfaction.      I  spent 30 minutes counseling the patient face to face and 50% or more the  time was spent in counseling and coordination of care.  The total time spent in the appointment was 60 minutes.    Jari Favre, PA-C  01/22/2017 Patient examined, coronary arteriograms and echocardiograms personally reviewed and d/w patient. Agree with above assessment by Margaretha Glassing, PA Plan AVR CABG for AS, L main stenosis His dental x-ray is abnormal; and dental eval has been requested Surgery planned this admission  P Donata Clay, MD

## 2017-01-22 NOTE — Progress Notes (Signed)
ANTICOAGULATION CONSULT NOTE - Follow Up Consult  Pharmacy Consult for heparin Indication: CAD awaiting CVTS consult   Labs:  Recent Labs  01/20/17 0439 01/20/17 0807 01/20/17 1033 01/20/17 1252  01/21/17 0411 01/22/17 0323 01/22/17 1403 01/22/17 1954  HGB 13.6  --   --   --   --  14.4 15.0  --   --   HCT 39.7  --   --   --   --  42.2 44.3  --   --   PLT 167  --   --   --   --  177 193  --   --   LABPROT  --   --   --   --   --  14.1  --   --   --   INR  --   --   --   --   --  1.09  --   --   --   HEPARINUNFRC  --   --   --   --   < > 0.77* 0.26* 0.38 0.46  CREATININE 1.12  --   --  1.11  --   --  1.06  --   --   TROPONINI <0.03 <0.03 <0.03  --   --   --   --   --   --   < > = values in this interval not displayed.   Assessment: 76yo male on heparin 950 units/hr resumed 8/13 post-cath.  Post cath: with left main and LAD and circumflex disease. Moderate aortic stenosis. Planning for CABG/AVR on 01/29/17.   PM heparin level = 0.46  Goal of Therapy:  Heparin level 0.3-0.7 units/ml   Plan:  Continue heparin gtt at 950 units/hr  Daily HL and CBC.   Thank you Okey RegalLisa Kerrington Sova, PharmD [phone] 01/22/2017,9:25 PM

## 2017-01-22 NOTE — Progress Notes (Signed)
ANTICOAGULATION CONSULT NOTE - Follow Up Consult  Pharmacy Consult for heparin Indication: CAD awaiting CVTS consult   Labs:  Recent Labs  01/20/17 0439 01/20/17 0807 01/20/17 1033 01/20/17 1252  01/21/17 0411 01/22/17 0323 01/22/17 1403  HGB 13.6  --   --   --   --  14.4 15.0  --   HCT 39.7  --   --   --   --  42.2 44.3  --   PLT 167  --   --   --   --  177 193  --   LABPROT  --   --   --   --   --  14.1  --   --   INR  --   --   --   --   --  1.09  --   --   HEPARINUNFRC  --   --   --   --   < > 0.77* 0.26* 0.38  CREATININE 1.12  --   --  1.11  --   --  1.06  --   TROPONINI <0.03 <0.03 <0.03  --   --   --   --   --   < > = values in this interval not displayed.   Assessment: 76yo male on heparin 950 units/hr resumed 8/13 post-cath.  8h heparin level =0.38 therapeutic.  Post cath: with left main and LAD and circumflex disease. Moderate aortic stenosis. Planning for CABG/AVR on 01/29/17.   Goal of Therapy:  Heparin level 0.3-0.7 units/ml   Plan:  Continue heparin gtt at 950 units/hr  Check heparin level in 6 hours to confirm remains therapeutic  Daily HL and CBC.    Noah Delaineuth Kennedy Brines, RPh Clinical Pharmacist Pager: [phone] 01/22/2017,3:56 PM

## 2017-01-22 NOTE — Progress Notes (Signed)
Subjective:  No recurrent ischemic chest pain overnight.  Awaiting cardiac surgery consultation.  Complains of soreness in the left side where he had his echo.  Objective:  Vital Signs in the last 24 hours: BP 91/70 (BP Location: Right Arm)   Pulse 72   Temp (!) 97.4 F (36.3 C) (Oral)   Resp 16   Ht 6' (1.829 m)   Wt 81.6 kg (179 lb 14.4 oz) Comment: b scale  SpO2 97%   BMI 24.40 kg/m   Physical Exam: Pleasant male in no acute distress Lungs:  Clear Cardiac:  Regular rhythm, normal S1 , preserved S2, 2/6 early to midsystolic murmur of aortic stenosis radiating to carotids Extremities: Catheterization site is clean and dry there is no hematoma noted   Intake/Output from previous day: 08/13 0701 - 08/14 0700 In: 435 [P.O.:360; I.V.:75] Out: 1450 [Urine:1450]  Weight Filed Weights   01/20/17 0912 01/21/17 0625 01/22/17 0646  Weight: 84 kg (185 lb 3.2 oz) 82.1 kg (181 lb 1.6 oz) 81.6 kg (179 lb 14.4 oz)    Lab Results: Basic Metabolic Panel:  Recent Labs  82/95/6207/05/29 1252 01/22/17 0323  NA 137 139  K 3.6 3.9  CL 102 105  CO2 24 26  GLUCOSE 80 96  BUN 10 11  CREATININE 1.11 1.06   CBC:  Recent Labs  01/21/17 0411 01/22/17 0323  WBC 5.4 5.7  HGB 14.4 15.0  HCT 42.2 44.3  MCV 90.8 90.4  PLT 177 193   Cardiac Enzymes: Troponin (Point of Care Test)  Recent Labs  01/19/17 2123  TROPIPOC 0.00   Cardiac Panel (last 3 results)  Recent Labs  01/20/17 0439 01/20/17 0807 01/20/17 1033  TROPONINI <0.03 <0.03 <0.03    Telemetry: Normal sinus rhythm personally reviewed  Assessment/Plan:  1.  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  Moderate aortic stenosis with mild regurgitation with gradient increased by echo since October-re-review of cath hemodynamic data pending-to my eye appears to be at least 20 mm peak to peak if not more. 3.  Hyperlipidemia with statin intolerance  Recommendations:  Awaiting cardiovascular  surgery opinion.  Patient currently stable.      Jordan Hammond, Jr.  MD [facility]  01/22/2017, 8:36 AM

## 2017-01-22 NOTE — Progress Notes (Signed)
PROGRESS NOTE                                                                                                                                                                                                             Patient Demographics:    Jordan Hammond, is a 76 y.o. male, DOB - [birth year], ZOX:[id number]  Admit date - 01/19/2017   Admitting Physician Inez Catalina, MD  Outpatient Primary MD for the patient is System, Pcp Not In  LOS - 1  Outpatient Specialists:Dr. Donnie Aho  Chief Complaint  Patient presents with  . Chest Pain       Brief Narrative   76 year old male wit history of heart murmur, RLS, GERD status post Nissen fundoplication presented with substernal chest pain irradiating to the sides and to the back with tightness, improved with nitroglycerin and aspirin. Symptoms occurred at rest. Reports having similar less intense symptoms in the past. Reports having stress test sometime in 2010. Also reports intentional weight loss in past 2 months. Patient denies any shortness of breath, diaphoresis, palpitations, nausea, vomiting, abdominal pain, fevers, chills, bowel or urinary symptoms. No sick contact or recent travel. No new medications. In the ED he was found to be bradycardic in the low 50s and occasionally in the 40s. EKG showed sinus rhythm with poor wave progression and initial troponin was negative.  Patient admitted for unstable angina.   Subjective:   He denies any chest pain symptoms.   Assessment  & Plan :    Active Problems: Unstable angina. Patient started on IV heparin. Cardiac cath done on 8/13 showing left main, mid LAD and obtuse marginal branch disease. 2-D echo shows normal LVEF of 60-65%, no wall motion abnormality and moderate to severe aortic stenosis. Cardiothoracic surgery consulted for CABG and possible aortic valve repair. Surgery scheduled for 8/21 by Dr Morton Peters.   Bradycardia Heart rate in the 40s on presentation. Avoiding AV nodal blocking agents. TSH is normal. 2-D echo reviewed. Heart rate remains stable on telemetry.    Restless leg syndrome Continue Neurontin and Klonopin      Code Status : Full code  Family Communication  : None at bedside  Disposition Plan  : CABG and aVR on 8/21.  Barriers For Discharge : Active symptoms  Consults  :  Cardiology Cardiothoracic surgery  Procedures  :  2-D echo Cardiac cath  DVT Prophylaxis  : IV heparin  Lab Results  Component Value Date   PLT 193 01/22/2017    Antibiotics  :    Anti-infectives    None        Objective:   Vitals:   01/21/17 2015 01/22/17 0059 01/22/17 0646 01/22/17 0718  BP: 126/61 115/67 109/64 91/70  Pulse: 61 72 74 72  Resp: 14  15 16   Temp: 98 F (36.7 C)  97.8 F (36.6 C) (!) 97.4 F (36.3 C)  TempSrc: Oral  Oral Oral  SpO2: 99% 98% 95% 97%  Weight:   81.6 kg (179 lb 14.4 oz)   Height:        Wt Readings from Last 3 Encounters:  01/22/17 81.6 kg (179 lb 14.4 oz)     Intake/Output Summary (Last 24 hours) at 01/22/17 1323 Last data filed at 01/22/17 0900  Gross per 24 hour  Intake              555 ml  Output             1650 ml  Net            -1095 ml     Physical Exam Gen.: Not in distress HEENT: Moist mucosa, supple neck Chest: Clear bilaterally CVS: Normal S1 and S2, systolic murmur 3/6 GI: Soft, nondistended, nontender Musculoskeletal: Warm, no edema,  right groin cath site appears clean.      Data Review:    CBC  Recent Labs Lab 01/19/17 2122 01/20/17 0439 01/21/17 0411 01/22/17 0323  WBC 5.1 5.0 5.4 5.7  HGB 13.2 13.6 14.4 15.0  HCT 38.3* 39.7 42.2 44.3  PLT 166 167 177 193  MCV 90.8 91.1 90.8 90.4  MCH 31.3 31.2 31.0 30.6  MCHC 34.5 34.3 34.1 33.9  RDW 12.8 12.9 12.9 12.8    Chemistries   Recent Labs Lab 01/19/17 2122 01/20/17 0439 01/20/17 1252 01/22/17 0323  NA 136  --  137 139  K 3.8  --  3.6  3.9  CL 103  --  102 105  CO2 26  --  24 26  GLUCOSE 93  --  80 96  BUN 12  --  10 11  CREATININE 1.11 1.12 1.11 1.06  CALCIUM 8.8*  --  9.3 9.3  AST  --   --  24  --   ALT  --   --  19  --   ALKPHOS  --   --  44  --   BILITOT  --   --  1.2  --    ------------------------------------------------------------------------------------------------------------------  Recent Labs  01/20/17 1252  CHOL 169  HDL 48  LDLCALC 103*  TRIG 92  CHOLHDL 3.5    No results found for: HGBA1C ------------------------------------------------------------------------------------------------------------------  Recent Labs  01/20/17 0439  TSH 2.757   ------------------------------------------------------------------------------------------------------------------ No results for input(s): VITAMINB12, FOLATE, FERRITIN, TIBC, IRON, RETICCTPCT in the last 72 hours.  Coagulation profile  Recent Labs Lab 01/21/17 0411  INR 1.09    No results for input(s): DDIMER in the last 72 hours.  Cardiac Enzymes  Recent Labs Lab 01/20/17 0439 01/20/17 0807 01/20/17 1033  TROPONINI <0.03 <0.03 <0.03   ------------------------------------------------------------------------------------------------------------------ No results found for: BNP  Inpatient Medications  Scheduled Meds: . aspirin EC  81 mg Oral Daily  . calcium-vitamin D  1 tablet Oral Q breakfast  . clonazePAM  1 mg Oral QHS  . gabapentin  300 mg Oral QHS  .  sodium chloride flush  3 mL Intravenous Q12H   Continuous Infusions: . sodium chloride    . heparin 950 Units/hr (01/22/17 0616)   PRN Meds:.sodium chloride, acetaminophen, gi cocktail, morphine injection, nitroGLYCERIN, ondansetron (ZOFRAN) IV, sodium chloride flush  Micro Results No results found for this or any previous visit (from the past 240 hour(s)).  Radiology Reports Dg Orthopantogram  Result Date: 01/22/2017 CLINICAL DATA:  Preoperative evaluation for CABG.  EXAM: ORTHOPANTOGRAM/PANORAMIC COMPARISON:  None. FINDINGS: Tiny radicular cyst associated with the roots of the left mandibular molar. Question larger radicular cyst associated with left mandibular incisor 24. No other evidence of active dental or periodontal disease. Numerous dental fillings. Old root canal right maxillary premolar. IMPRESSION: Tiny radicular cyst associated with the left mandibular molar. Larger radicular cyst associated with left mandibular incisor. No other sign of active dental or periodontal disease. Electronically Signed   By: Paulina FusiMark  Shogry M.D.   On: 01/22/2017 10:29   Dg Chest 2 View  Result Date: 01/19/2017 CLINICAL DATA:  Chest pain and tightness since last night. Mild shortness of breath. EXAM: CHEST  2 VIEW COMPARISON:  None. FINDINGS: Normal sized heart. Clear lungs with normal vascularity. Mild diffuse peribronchial thickening and accentuation of the interstitial markings. Thoracic spine degenerative changes. IMPRESSION: No acute abnormality.  Mild chronic bronchitic changes. Electronically Signed   By: Beckie SaltsSteven  Reid M.D.   On: 01/19/2017 19:23   Ct Angio Chest/abd/pel For Dissection W And/or W/wo  Result Date: 01/20/2017 CLINICAL DATA:  Chest pain starting last evening without relief from nitroglycerin. EXAM: CT ANGIOGRAPHY CHEST, ABDOMEN AND PELVIS TECHNIQUE: Multidetector CT imaging through the chest, abdomen and pelvis was performed using the standard protocol during bolus administration of intravenous contrast. Multiplanar reconstructed images and MIPs were obtained and reviewed to evaluate the vascular anatomy. CONTRAST:  100 cc Isovue 370 IV COMPARISON:  06/01/2007 CT chest report FINDINGS: CTA CHEST FINDINGS Cardiovascular: No aortic aneurysm or dissection. No acute pulmonary embolus. Normal size heart without pericardial effusion. 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 Trachea and mainstem bronchi are patent. No thyroid abnormality. Esophagus is unremarkable. Lungs/Pleura: Lungs are clear. No pleural effusion or pneumothorax. Musculoskeletal: No chest wall abnormality. No acute or significant osseous findings. Thoracic spondylosis with mild disc space narrowing and multilevel anterior osteophyte formation. Review of the MIP images confirms the above findings. CTA ABDOMEN AND PELVIS FINDINGS VASCULAR Aorta: Normal caliber aorta without aneurysm, dissection, vasculitis or significant stenosis. Mild atherosclerosis. Celiac: Patent without evidence of aneurysm, dissection, vasculitis or significant stenosis. SMA: Patent without evidence of aneurysm, dissection, vasculitis or significant stenosis. Renals: Both renal arteries are patent without evidence of aneurysm, dissection, vasculitis, fibromuscular dysplasia or significant stenosis. IMA: Patent without evidence of aneurysm, dissection, vasculitis or significant stenosis. Inflow: Patent without evidence of aneurysm, dissection, vasculitis or significant stenosis. Veins: No obvious venous abnormality within the limitations of this arterial phase study. Review of the MIP images confirms the above findings. NON-VASCULAR Hepatobiliary: Nonspecific hypodensities in the left and right hepatic lobes consistent statistically with cysts or hemangiomata, the largest is in the right hepatic lobe measuring approximately 2.8 x 2.6 x 2 cm. There is a 1.1 cm hypodensity in the left hepatic lobe. There is no biliary dilatation. Gallbladder is surgically absent. Pancreas: Atrophic appearance of the pancreas without ductal dilatation or mass. Spleen: Normal Adrenals/Urinary Tract: Normal bilateral adrenal glands. No renal cortical  thinning, obstructive uropathy or enhancing mass. No hydroureteronephrosis. Urinary bladder is physiologically distended.  Stomach/Bowel: Stomach is within normal limits. Appendix is not confidently identified however no findings of right lower quadrant or pericecal inflammation. No evidence of bowel wall thickening, distention, or inflammatory changes. Lymphatic: No lymphadenopathy. Reproductive: Prostate is unremarkable. Other: No abdominal wall hernia or abnormality. No abdominopelvic ascites. Musculoskeletal: No acute or significant osseous findings. Review of the MIP images confirms the above findings. IMPRESSION: 1. No evidence of aortic aneurysm or dissection. 2. Mild aortic and branch vessel atherosclerosis. 3. No acute pulmonary embolus. 4. Clear lungs. 5. Nonspecific hepatic hypodensities statistically consistent with cysts or hemangiomas, the largest in the right hepatic lobe measuring 2.8 x 2.6 x 2 cm with a 1.1 cm left hepatic lobe hypodensity also noted. Electronically Signed   By: Tollie Eth M.D.   On: 01/20/2017 00:00    Time Spent in minutes  25   Eddie North M.D on 01/22/2017 at 1:23 PM  Between 7am to 7pm - Pager - [phone]  After 7pm go to [email or web address] - password [facility]  [facility] -  Office  [phone]

## 2017-01-22 NOTE — Progress Notes (Signed)
ANTICOAGULATION CONSULT NOTE - Follow Up Consult  Pharmacy Consult for heparin Indication: CAD awaiting CVTS consult   Labs:  Recent Labs  01/20/17 0439 01/20/17 0807 01/20/17 1033 01/20/17 1252 01/20/17 2131 01/21/17 0411 01/22/17 0323  HGB 13.6  --   --   --   --  14.4 15.0  HCT 39.7  --   --   --   --  42.2 44.3  PLT 167  --   --   --   --  177 193  LABPROT  --   --   --   --   --  14.1  --   INR  --   --   --   --   --  1.09  --   HEPARINUNFRC  --   --   --   --  0.76* 0.77* 0.26*  CREATININE 1.12  --   --  1.11  --   --  1.06  TROPONINI <0.03 <0.03 <0.03  --   --   --   --      Assessment: 76yo male now subtherapeutic on heparin after resumed post-cath.  Goal of Therapy:  Heparin level 0.3-0.7 units/ml   Plan:  Will increase heparin gtt slightly to 950 units/hr and check level in 8hr.  Vernard GamblesVeronda Demiyah Fischbach, PharmD, BCPS  01/22/2017,6:11 AM

## 2017-01-23 ENCOUNTER — Inpatient Hospital Stay (HOSPITAL_COMMUNITY): Payer: Medicare HMO

## 2017-01-23 ENCOUNTER — Encounter (HOSPITAL_COMMUNITY): Payer: Self-pay | Admitting: Dentistry

## 2017-01-23 DIAGNOSIS — Z01810 Encounter for preprocedural cardiovascular examination: Secondary | ICD-10-CM

## 2017-01-23 DIAGNOSIS — I350 Nonrheumatic aortic (valve) stenosis: Secondary | ICD-10-CM

## 2017-01-23 DIAGNOSIS — Z01818 Encounter for other preprocedural examination: Secondary | ICD-10-CM

## 2017-01-23 DIAGNOSIS — R0602 Shortness of breath: Secondary | ICD-10-CM

## 2017-01-23 LAB — CBC
HEMATOCRIT: 42.5 % (ref 39.0–52.0)
HEMOGLOBIN: 14.3 g/dL (ref 13.0–17.0)
MCH: 30.3 pg (ref 26.0–34.0)
MCHC: 33.6 g/dL (ref 30.0–36.0)
MCV: 90 fL (ref 78.0–100.0)
Platelets: 185 10*3/uL (ref 150–400)
RBC: 4.72 MIL/uL (ref 4.22–5.81)
RDW: 12.9 % (ref 11.5–15.5)
WBC: 5.6 10*3/uL (ref 4.0–10.5)

## 2017-01-23 LAB — URINALYSIS, ROUTINE W REFLEX MICROSCOPIC
Bilirubin Urine: NEGATIVE
Glucose, UA: NEGATIVE mg/dL
Hgb urine dipstick: NEGATIVE
Ketones, ur: NEGATIVE mg/dL
Leukocytes, UA: NEGATIVE
Nitrite: NEGATIVE
Protein, ur: NEGATIVE mg/dL
Specific Gravity, Urine: 1.021 (ref 1.005–1.030)
pH: 5 (ref 5.0–8.0)

## 2017-01-23 LAB — SURGICAL PCR SCREEN
MRSA, PCR: NEGATIVE
Staphylococcus aureus: NEGATIVE

## 2017-01-23 LAB — HEPARIN LEVEL (UNFRACTIONATED): Heparin Unfractionated: 0.51 IU/mL (ref 0.30–0.70)

## 2017-01-23 MED ORDER — CHLORHEXIDINE GLUCONATE 0.12 % MT SOLN
15.0000 mL | Freq: Two times a day (BID) | OROMUCOSAL | Status: DC
  Administered 2017-01-23 – 2017-01-28 (×11): 15 mL via OROMUCOSAL
  Filled 2017-01-23 (×12): qty 15

## 2017-01-23 NOTE — Progress Notes (Signed)
Pre-op Cardiac Surgery  Carotid Findings:  Findings suggest 1-39% internal carotid artery stenosis bilaterally. Vertebral arteries are patent with antegrade flow.  Upper Extremity Right Left  Brachial Pressures 137-Triphasic 122-Triphasic  Radial Waveforms Triphasic Triphasic  Ulnar Waveforms Biphasic Triphasic  Palmar Arch (Allen's Test) Signal obliterates with radial compression, is unaffected with ulnar compression. Within normal limits.    Lower  Extremity Right Left  Dorsalis Pedis Triphasic Triphasic  Posterior Tibial Triphasic Triphasic   01/23/2017 5:56 PM Gertie FeyMichelle Juda Toepfer, BS, RVT, RDCS, RDMS

## 2017-01-23 NOTE — Progress Notes (Addendum)
Subjective:  No recurrent ischemic chest pain overnight.  He has seen the surgical PA but the surgeon has not seen him yet.  Reportedly surgery is almost a week away.  Objective:  Vital Signs in the last 24 hours: BP 91/64 (BP Location: Left Arm)   Pulse 81   Temp (!) 97.4 F (36.3 C) (Oral)   Resp 20   Ht 6' (1.829 m)   Wt 82.1 kg (180 lb 14.4 oz)   SpO2 99%   BMI 24.53 kg/m   Physical Exam: Pleasant male in no acute distress Lungs:  Clear Cardiac:  Regular rhythm, normal S1 , preserved S2, 2/6 early to midsystolic murmur of aortic stenosis radiating to carotids Extremities: Catheterization site is clean and dry   Intake/Output from previous day: 08/14 0701 - 08/15 0700 In: 720 [P.O.:720] Out: 2000 [Urine:2000]  Weight Filed Weights   01/21/17 0625 01/22/17 0646 01/23/17 0558  Weight: 82.1 kg (181 lb 1.6 oz) 81.6 kg (179 lb 14.4 oz) 82.1 kg (180 lb 14.4 oz)    Lab Results: Basic Metabolic Panel:  Recent Labs  16/03/9607/12/18 1252 01/22/17 0323  NA 137 139  K 3.6 3.9  CL 102 105  CO2 24 26  GLUCOSE 80 96  BUN 10 11  CREATININE 1.11 1.06   CBC:  Recent Labs  01/22/17 0323 01/23/17 0425  WBC 5.7 5.6  HGB 15.0 14.3  HCT 44.3 42.5  MCV 90.4 90.0  PLT 193 185   Cardiac Panel (last 3 results)  Recent Labs  01/20/17 1033  TROPONINI <0.03    Telemetry: Normal sinus rhythm personally reviewed  Assessment/Plan:  1.  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  Moderate aortic stenosis with mild regurgitation with gradient increased by echo since October-re-review of cath hemodynamic data With Dr. Allyson SabalBerry confirms a 20 mm peak to peak gradient.   3.  Hyperlipidemia with statin intolerance  Recommendations:  Awaiting cardiovascular surgery opinion.  Patient currently stable, will speak to the surgeons to see if the surgery can be moved up.  May transfer patient to my service if you would like.      Darden PalmerW. Spencer Tilley, Jr.  MD  [facility] Alliance[facility]ACC Cardiology  01/23/2017, 9:19 AM

## 2017-01-23 NOTE — Consult Note (Signed)
DENTAL CONSULTATION  Date of Consultation:  01/23/2017 Patient Name:   Jordan Hammond Date of Birth:   1940-11-20 Medical Record Number: [id number]  VITALS: BP 112/67 (BP Location: Right Arm)   Pulse 79   Temp 97.8 F (36.6 C) (Oral)   Resp 20   Ht 6' (1.829 m)   Wt 180 lb 14.4 oz (82.1 kg)   SpO2 (!) 9%   BMI 24.53 kg/m   CHIEF COMPLAINT: Patient referred by Dr. Kathlee Nations Trigt for a dental consultation.   HPI: Jordan Hammond is a 76 year old male recently diagnosed with coronary artery disease and aortic stenosis. Patient with anticipated coronary artery bypass graft procedure along with aortic valve replacement. Patient is now seen as part of a medically necessary pre-heart valve surgery dental protocol examination rule out dental infection that may affect the patient's systemic health and anticipated heart valve surgery.  Patient currently denies acute toothaches, swellings, or abscesses. Patient was last seen by dentist approximately one year ago for an exam and cleaning. This was with Dr. Irven Easterly. Dr. Jari Favre has since retired. The patient is usually seen on an every 6 month basis. The patient denies having any partial dentures. Patient denies having dental phobia.  PROBLEM LIST: Patient Active Problem List   Diagnosis Date Noted  . Precordial pain   . Chest pain at rest 01/20/2017  . Bradycardia 01/20/2017  . Restless leg syndrome 01/20/2017  . Aortic stenosis 01/20/2017  . CAD (coronary artery disease), native coronary artery 01/20/2017  . Aortic atherosclerosis (HCC) 01/20/2017  . Unstable angina (HCC) 01/20/2017    PMH: Past Medical History:  Diagnosis Date  . Aortic atherosclerosis (HCC) 01/20/2017  . Aortic stenosis 01/20/2017  . CAD (coronary artery disease), native coronary artery 01/20/2017  . GERD (gastroesophageal reflux disease)   . Hiatal hernia   . Hyperlipidemia   . Kidney stones   . Restless leg syndrome     PSH: Past Surgical History:   Procedure Laterality Date  . APPENDECTOMY    . bowel obstruction surgery    . CHOLECYSTECTOMY    . KNEE ARTHROSCOPY    . lap nissan    . LEFT HEART CATH AND CORONARY ANGIOGRAPHY N/A 01/21/2017   Procedure: LEFT HEART CATH AND CORONARY ANGIOGRAPHY;  Surgeon: Runell Gess, MD;  Location: [facility] INVASIVE CV LAB;  Service: Cardiovascular;  Laterality: N/A;  . RIGHT HEART CATH N/A 01/21/2017   Procedure: RIGHT HEART CATH;  Surgeon: Runell Gess, MD;  Location: [facility] INVASIVE CV LAB;  Service: Cardiovascular;  Laterality: N/A;  . SHOULDER SURGERY      ALLERGIES: Allergies  Allergen Reactions  . Aspirin Nausea And Vomiting    Very heavy doses  . Erythromycin Nausea And Vomiting    Unsure if there are other reactions  . Ibuprofen Nausea And Vomiting    Very heavy doses  . Nabumetone Nausea And Vomiting  . Nsaids Nausea And Vomiting    Very heavy doses  . Sulfa Antibiotics   . Tetracycline Nausea And Vomiting    Unsure if there are other reactions  . Sulfamethoxazole Itching, Rash and Swelling  . Sulfur Itching, Rash and Swelling    MEDICATIONS: Current Facility-Administered Medications  Medication Dose Route Frequency Provider Last Rate Last Dose  . 0.9 %  sodium chloride infusion  250 mL Intravenous PRN Runell Gess, MD      . acetaminophen (TYLENOL) tablet 650 mg  650 mg Oral Q4H PRN Runell Gess, MD   [id number]  mg at 01/23/17 [id number]  . aspirin EC tablet 81 mg  81 mg Oral Daily Othella Boyerilley, William S, MD   81 mg at 01/23/17 1012  . calcium-vitamin D (OSCAL WITH D) 500-200 MG-UNIT per tablet 1 tablet  1 tablet Oral Q breakfast Inez CatalinaMullen, Emily B, MD   1 tablet at 01/23/17 0618  . clonazePAM (KLONOPIN) tablet 1 mg  1 mg Oral QHS Debe CoderMullen, Emily B, MD   1 mg at 01/22/17 2207  . gabapentin (NEURONTIN) capsule 300 mg  300 mg Oral QHS Debe CoderMullen, Emily B, MD   300 mg at 01/22/17 2200  . gi cocktail (Maalox,Lidocaine,Donnatal)  30 mL Oral QID PRN Debe CoderMullen, Emily B, MD      . heparin ADULT infusion  100 units/mL (25000 units/2450mL sodium chloride 0.45%)  950 Units/hr Intravenous Continuous Juliette MangleBryk, Veronda P, RPH 9.5 mL/hr at 01/22/17 1736 950 Units/hr at 01/22/17 1736  . morphine 4 MG/ML injection 2 mg  2 mg Intravenous Q1H PRN Runell GessBerry, Jonathan J, MD      . nitroGLYCERIN (NITROSTAT) SL tablet 0.4 mg  0.4 mg Sublingual Q5 min PRN Dhungel, Nishant, MD      . ondansetron (ZOFRAN) injection 4 mg  4 mg Intravenous Q6H PRN Runell GessBerry, Jonathan J, MD      . sodium chloride flush (NS) 0.9 % injection 3 mL  3 mL Intravenous Q12H Runell GessBerry, Jonathan J, MD   3 mL at 01/23/17 1000  . sodium chloride flush (NS) 0.9 % injection 3 mL  3 mL Intravenous PRN Runell GessBerry, Jonathan J, MD        LABS: Lab Results  Component Value Date   WBC 5.6 01/23/2017   HGB 14.3 01/23/2017   HCT 42.5 01/23/2017   MCV 90.0 01/23/2017   PLT 185 01/23/2017      Component Value Date/Time   NA 139 01/22/2017 0323   K 3.9 01/22/2017 0323   CL 105 01/22/2017 0323   CO2 26 01/22/2017 0323   GLUCOSE 96 01/22/2017 0323   BUN 11 01/22/2017 0323   CREATININE 1.06 01/22/2017 0323   CALCIUM 9.3 01/22/2017 0323   GFRNONAA >60 01/22/2017 0323   GFRAA >60 01/22/2017 0323   Lab Results  Component Value Date   INR 1.09 01/21/2017   No results found for: PTT  SOCIAL HISTORY: Social History   Social History  . Marital status: Married    Spouse name: N/A  . Number of children: N/A  . Years of education: N/A   Occupational History  . Not on file.   Social History Main Topics  . Smoking status: Never Smoker  . Smokeless tobacco: Never Used  . Alcohol use No  . Drug use: No  . Sexual activity: Yes   Other Topics Concern  . Not on file   Social History Narrative   Divorced, remarried.  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 CAD Father        s/p triple bypass  . Dementia Mother     REVIEW OF SYSTEMS: Reviewed with the  patient as per History of present illness. Psych: Patient denies having dental phobia.  DENTAL HISTORY: CHIEF COMPLAINT: Patient referred by Dr. Kathlee NationsPeter Van Trigt for a dental consultation.   HPI: Riki SheerLaverne Washinton is a 76 year old male recently diagnosed with coronary artery disease and aortic stenosis. Patient with anticipated coronary artery bypass graft procedure along with aortic valve  replacement. Patient is now seen as part of a medically necessary pre-heart valve surgery dental protocol examination rule out dental infection that may affect the patient's systemic health and anticipated heart valve surgery.  Patient currently denies acute toothaches, swellings, or abscesses. Patient was last seen by dentist approximately one year ago for an exam and cleaning. This was with Dr. Irven Easterly. Dr. Jari Favre has since retired. The patient is usually seen on an every 6 month basis. The patient denies having any partial dentures. Patient denies having dental phobia.   DENTAL EXAMINATION: GENERAL: The patient is a well-developed, well-nourished male in no acute distress. HEAD AND NECK: The patient has no palpable neck lymphadenopathy. The patient denies acute TMJ symptoms but recently had an episode of a "locked jaw" in the open position during his emergency room visit. Patient was able to reduce the locked jaw by himself. This has never happened to the patient before by his report.  INTRAORAL EXAM:Patient has normal saliva. There is no evidence of oral abscess formation.  DENTITION:Patient is missing tooth numbers 2, 15, 19, and 30-32. PERIODONTAL:Patient has chronic periodontitis with minimal plaque and calculus accumulations, selective areas gingival recession and no significant tooth mobility. There is incipient to moderate bone loss noted.  DENTAL CARIES/SUBOPTIMAL RESTORATIONS:No obvious dental caries are noted at this time. I would need a full series of dental radiographs to rule out other incipient  dental caries.  ENDODONTIC:Patient currently denies acute pulpitis symptoms. My review of the orthopantogram does not reveal any obvious periapical  pathology or radiolucency. Patient has had previous root canal therapy on tooth #4.  CROWN AND BRIDGE:Patient has crown the tooth numbers 3 and 4.  PROSTHODONTIC:Patient denies having any partial dentures.  OCCLUSION:Patient has a poor occlusal scheme but a stable occlusion.  RADIOGRAPHIC INTERPRETATION: Orthopantogram was taken on 01/22/2017 at [facility]. There are multiple missing teeth. There is supra-eruption and drifting of the unopposed teeth into the edentulous areas. There is incipient to moderate bone loss noted. No obvious periapical radiolucencies are noted per my review. Patient has multiple dental restorations noted. Patient has a previous root canal therapy in the area of tooth #4. There is decreased trabecular bone pattern consistent with osteopenia or osteoporosis.    ASSESSMENTS: 1.Severe aortic stenosis 2. Pre-heart valve surgery dental protocol 3. Chronic periodontitis with bone loss 4. Selective areas of gingival recession 5. No significant tooth mobility. 6. No obvious areas of periapical pathology or radiolucency 7. Multiple missing teeth 8. Supra-eruption and drifting of the unopposed teeth into the edentulous areas 9. Poor occlusal scheme but stable occlusion  PLAN/RECOMMENDATIONS: 1. I discussed the risks, benefits, and complications of various treatment options with the patient in relationship to his  medical and dental conditions, anticipated aortic valve replacement, and risk for endocarditis. We discussed various treatment options to include no treatment,  periodontal therapy, dental restorations, crown and bridge therapy, implant therapy, and replacement of missing teeth as indicated. The patient currently wishes to defer any dental treatment at this time. The patient is cleared for coronary artery bypass  graft procedure along with heart valve surgery with Dr. Donata Clay early next week. The patient will then need to follow-up with a dentist of his choice for exam and cleaning in 3-6 months once he is medically stable from the anticipated heart valve surgery. Patient is aware that he will need antibiotic premedication prior to invasive dental procedures per American Heart Association guidelines after the heart valve surgery. The patient will be placed  on chlorhexidine rinses for use on a twice a day basis for the next 6 months.   2. Discussion of findings with medical team and coordination of future medical and dental care as needed.The patient is currently cleared for heart valve surgery at this time.      Charlynne Pander, DDS

## 2017-01-23 NOTE — Progress Notes (Signed)
CARDIAC REHAB PHASE I  Pt up in recliner, no complaints, MD to advise if pt appropriate for ambulation prior to surgery. Cardiac surgery pre-op education completed. Reviewed IS, sternal precautions, activity progression, cardiac surgery booklet and cardiac surgery guidelines. Left instructions to view cardiac surgery videos. Pt verbalized understanding. Pt in recliner, call bell within reach. Will follow.   [id number] Joylene GrapesEmily C Joshuajames Moehring, RN, BSN 01/23/2017 10:49 AM

## 2017-01-23 NOTE — Progress Notes (Signed)
76 year old male wit history of heart murmur, RLS, GERD status post Nissen fundoplication presented with substernal chest pain irradiating to the sides and to the back with tightness, improved with nitroglycerin and aspirin. He was admitted for unstable angina and was started on IV heparin. Cardiac cath done on 8/13 showing left main, mid LAD and obtuse marginal branch disease. 2-D echo shows normal LVEF of 60-65%, no wall motion abnormality and moderate to severe aortic stenosis. Cardiothoracic surgery consulted for CABG and possible aortic valve repair. Surgery scheduled for 8/21 by Dr Morton PetersVan Tright.  Meanwhile patient denies any complaints at this time.  And Dr Donnie Ahoilley has graciously taken the patient on his service.  TRH will sign off and please call us for any input.   Kathlen ModyVijaya Robbi Scurlock, MD [phone]

## 2017-01-23 NOTE — Progress Notes (Signed)
ANTICOAGULATION CONSULT NOTE - Follow Up Consult  Pharmacy Consult for heparin Indication: CAD awaiting CVTS consult   Labs:  Recent Labs  01/21/17 0411 01/22/17 0323 01/22/17 1403 01/22/17 1954 01/23/17 0425  HGB 14.4 15.0  --   --  14.3  HCT 42.2 44.3  --   --  42.5  PLT 177 193  --   --  185  LABPROT 14.1  --   --   --   --   INR 1.09  --   --   --   --   HEPARINUNFRC 0.77* 0.26* 0.38 0.46 0.51  CREATININE  --  1.06  --   --   --      Assessment: 76yo male on heparin 950 units/hr resumed 8/13 post-cath.  Left main and LAD and circumflex disease. Moderate aortic stenosis. Planning for CABG/AVR on 01/29/17.   Heparin level = 0.51, remains therapeutic on IV heparin 950 units/hr  CBC wnl, no bleeding noted   Goal of Therapy:  Heparin level 0.3-0.7 units/ml   Plan:  Continue heparin gtt at 950 units/hr  Daily HL and CBC.   Thank you Noah Delaineuth Ivelis Norgard, RPh Clinical Pharmacist Pager: [phone] Main Pharmacy [phone] 01/23/2017,1:42 PM

## 2017-01-24 ENCOUNTER — Inpatient Hospital Stay (HOSPITAL_COMMUNITY): Payer: Medicare HMO

## 2017-01-24 LAB — VAS US DOPPLER PRE CABG
LEFT ECA DIAS: -6 cm/s
LEFT VERTEBRAL DIAS: 12 cm/s
Left CCA dist dias: 20 cm/s
Left CCA dist sys: 69 cm/s
Left CCA prox dias: 18 cm/s
Left CCA prox sys: 103 cm/s
Left ICA dist dias: -38 cm/s
Left ICA dist sys: -103 cm/s
Left ICA prox dias: -21 cm/s
Left ICA prox sys: -66 cm/s
RIGHT ECA DIAS: -15 cm/s
RIGHT VERTEBRAL DIAS: 8 cm/s
Right CCA prox dias: 10 cm/s
Right CCA prox sys: 58 cm/s
Right cca dist sys: -92 cm/s

## 2017-01-24 LAB — HEMOGLOBIN A1C
Hgb A1c MFr Bld: 5.3 % (ref 4.8–5.6)
Mean Plasma Glucose: 105.41 mg/dL

## 2017-01-24 LAB — COMPREHENSIVE METABOLIC PANEL
ALK PHOS: 47 U/L (ref 38–126)
ALT: 35 U/L (ref 17–63)
ANION GAP: 7 (ref 5–15)
AST: 37 U/L (ref 15–41)
Albumin: 3.9 g/dL (ref 3.5–5.0)
BILIRUBIN TOTAL: 1 mg/dL (ref 0.3–1.2)
BUN: 16 mg/dL (ref 6–20)
CALCIUM: 9.6 mg/dL (ref 8.9–10.3)
CO2: 28 mmol/L (ref 22–32)
Chloride: 105 mmol/L (ref 101–111)
Creatinine, Ser: 1.17 mg/dL (ref 0.61–1.24)
GFR, EST NON AFRICAN AMERICAN: 59 mL/min — AB (ref >60)
Glucose, Bld: 94 mg/dL (ref 65–99)
Potassium: 4 mmol/L (ref 3.5–5.1)
Sodium: 140 mmol/L (ref 135–145)
TOTAL PROTEIN: 6.6 g/dL (ref 6.5–8.1)

## 2017-01-24 LAB — PROTIME-INR
INR: 0.98
PROTHROMBIN TIME: 13 s (ref 11.4–15.2)

## 2017-01-24 LAB — CBC
HEMATOCRIT: 45.1 % (ref 39.0–52.0)
HEMOGLOBIN: 15 g/dL (ref 13.0–17.0)
MCH: 30.5 pg (ref 26.0–34.0)
MCHC: 33.3 g/dL (ref 30.0–36.0)
MCV: 91.9 fL (ref 78.0–100.0)
Platelets: 182 10*3/uL (ref 150–400)
RBC: 4.91 MIL/uL (ref 4.22–5.81)
RDW: 13.3 % (ref 11.5–15.5)
WBC: 6.1 10*3/uL (ref 4.0–10.5)

## 2017-01-24 LAB — TSH: TSH: 5.62 u[IU]/mL — ABNORMAL HIGH (ref 0.350–4.500)

## 2017-01-24 LAB — HEPARIN LEVEL (UNFRACTIONATED): HEPARIN UNFRACTIONATED: 0.5 [IU]/mL (ref 0.30–0.70)

## 2017-01-24 NOTE — Progress Notes (Addendum)
[address]       [place] [address]             [phone]        Jordan Hammond [facility] Medical Record #[id number] Date of Birth: 1941-04-19  Referring: No ref. provider found Primary Care: System, Pcp Not In  Chief Complaint:    Chief Complaint  Patient presents with  . Chest Pain  SOB with unstable angina AS and L main disease on cath Now stable on iv heparin  History of Present Illness:     Feels well Pre-cabg dopplers normal Dental eval - cleared for AVR Plan CABG/AVR first available opening on CT surgery OR time- Aug 21   Current Activity/ Functional Status: stable   Zubrod Score: At the time of surgery this patient's most appropriate activity status/level should be described as: []     0    Normal activity, no symptoms []     1    Restricted in physical strenuous activity but ambulatory, able to do out light work [x]     2    Ambulatory and capable of self care, unable to do work activities, up and about                 more than 50%  Of the time                            []     3    Only limited self care, in bed greater than 50% of waking hours []     4    Completely disabled, no self care, confined to bed or chair []     5    Moribund  Past Medical History:  Diagnosis Date  . Aortic atherosclerosis (HCC) 01/20/2017  . Aortic stenosis 01/20/2017  . CAD (coronary artery disease), native coronary artery 01/20/2017  . GERD (gastroesophageal reflux disease)   . Hiatal hernia   . Hyperlipidemia   . Kidney stones   . Restless leg syndrome     Past Surgical History:  Procedure Laterality Date  . APPENDECTOMY    . bowel obstruction surgery    . CHOLECYSTECTOMY    . KNEE ARTHROSCOPY    . lap nissan    . LEFT HEART CATH AND CORONARY ANGIOGRAPHY N/A 01/21/2017   Procedure: LEFT HEART CATH AND CORONARY ANGIOGRAPHY;  Surgeon: Runell Gess, MD;  Location: [facility] INVASIVE CV LAB;  Service: Cardiovascular;  Laterality: N/A;  . RIGHT HEART CATH  N/A 01/21/2017   Procedure: RIGHT HEART CATH;  Surgeon: Runell Gess, MD;  Location: [facility] INVASIVE CV LAB;  Service: Cardiovascular;  Laterality: N/A;  . SHOULDER SURGERY      History  Smoking Status  . Never Smoker  Smokeless Tobacco  . Never Used    History  Alcohol Use No    Social History   Social History  . Marital status: Married    Spouse name: N/A  . Number of children: N/A  . Years of education: N/A   Occupational History  . Not on file.   Social History Main Topics  . Smoking status: Never Smoker  . Smokeless tobacco: Never Used  . Alcohol use No  . Drug use: No  . Sexual activity: Yes   Other Topics Concern  . Not on file   Social History Narrative   Divorced, remarried.  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 Aspirin Nausea And Vomiting    Very heavy doses  . Erythromycin Nausea And Vomiting    Unsure if there are other reactions  . Ibuprofen Nausea And Vomiting    Very heavy doses  . Nabumetone Nausea And Vomiting  . Nsaids Nausea And Vomiting    Very heavy doses  . Sulfa Antibiotics   . Tetracycline Nausea And Vomiting    Unsure if there are other reactions  . Sulfamethoxazole Itching, Rash and Swelling  . Sulfur Itching, Rash and Swelling    Current Facility-Administered Medications  Medication Dose Route Frequency Provider Last Rate Last Dose  . 0.9 %  sodium chloride infusion  250 mL Intravenous PRN Runell Gess, MD      . acetaminophen (TYLENOL) tablet 650 mg  650 mg Oral Q4H PRN Runell Gess, MD   650 mg at 01/24/17 0753  . aspirin EC tablet 81 mg  81 mg Oral Daily Othella Boyer, MD   81 mg at 01/24/17 0932  . calcium-vitamin D (OSCAL WITH D) 500-200 MG-UNIT per tablet 1 tablet  1 tablet Oral Q breakfast Inez Catalina, MD   1 tablet at 01/24/17 0753  . chlorhexidine (PERIDEX) 0.12 % solution 15 mL  15 mL Mouth/Throat BID Cindra Eves F, DDS   15  mL at 01/24/17 0932  . clonazePAM (KLONOPIN) tablet 1 mg  1 mg Oral QHS Debe Coder B, MD   1 mg at 01/23/17 2313  . gabapentin (NEURONTIN) capsule 300 mg  300 mg Oral QHS Debe Coder B, MD   300 mg at 01/23/17 2313  . gi cocktail (Maalox,Lidocaine,Donnatal)  30 mL Oral QID PRN Debe Coder B, MD      . heparin ADULT infusion 100 units/mL (25000 units/271mL sodium chloride 0.45%)  950 Units/hr Intravenous Continuous Juliette Mangle, RPH 9.5 mL/hr at 01/23/17 2003 950 Units/hr at 01/23/17 2003  . morphine 4 MG/ML injection 2 mg  2 mg Intravenous Q1H PRN Runell Gess, MD      . nitroGLYCERIN (NITROSTAT) SL tablet 0.4 mg  0.4 mg Sublingual Q5 min PRN Dhungel, Nishant, MD      . ondansetron (ZOFRAN) injection 4 mg  4 mg Intravenous Q6H PRN Runell Gess, MD      . sodium chloride flush (NS) 0.9 % injection 3 mL  3 mL Intravenous Q12H Runell Gess, MD   3 mL at 01/23/17 2315  . sodium chloride flush (NS) 0.9 % injection 3 mL  3 mL Intravenous PRN Runell Gess, MD        Prescriptions Prior to Admission  Medication Sig Dispense Refill Last Dose  . acetaminophen (TYLENOL) 500 MG tablet Take 1,000 mg by mouth every 6 (six) hours as needed for headache.   01/19/2017 at Unknown time  . calcium citrate-vitamin D (CALCIUM + D) 315-200 MG-UNIT tablet Take 1 tablet by mouth daily.   01/19/2017 at Unknown time  . Cholecalciferol (VITAMIN D3) 5000 units CAPS Take 5,000 Units by mouth daily.   01/19/2017 at Unknown time  . clonazePAM (KLONOPIN) 1 MG tablet Take 1 mg by mouth at bedtime.   01/19/2017 at Unknown time  . Flaxseed Oil OIL Take 1,000 mg by mouth daily.   01/19/2017 at Unknown time  . gabapentin (NEURONTIN) 300 MG capsule Take 300 mg by mouth daily.   01/19/2017 at Unknown time  . nitroGLYCERIN (NITROSTAT) 0.4 MG SL  tablet Place 0.4 mg under the tongue daily as needed.   unknown at PRN    Family History  Problem Relation Age of Onset  . CAD Father        s/p triple bypass  .  Dementia Mother      Review of Systems:   had laparotomy for bowel resection several years ago- no recent GI symptoms  Hx of cardiac murmur the past few years followed by cardiology  Physical Exam: BP 120/70 (BP Location: Right Arm)   Pulse 63   Temp 98.1 F (36.7 C) (Oral)   Resp 20   Ht 6' (1.829 m)   Wt 180 lb 8 oz (81.9 kg) Comment: b scale  SpO2 97%   BMI 24.48 kg/m         Exam    General- alert and comfortable   Lungs- clear without rales, wheezes   Cor- regular rate and rhythm, 3/6 AS murmur without gallop   Abdomen- soft, non-tender   Extremities - warm, non-tender, minimal edema   Neuro- oriented, appropriate, no focal weakness   Diagnostic Studies & Laboratory data:     Recent Radiology Findings:   Dg Chest 2 View  Result Date: 01/24/2017 CLINICAL DATA:  Shortness of Breath EXAM: CHEST  2 VIEW COMPARISON:  01/19/2017 FINDINGS: Cardiac shadow is within normal limits. The lungs are well aerated bilaterally. No focal infiltrate or sizable effusion is noted. Chronic apical scarring is noted on the right. No focal infiltrate or sizable effusion is seen. Degenerative changes of thoracic spine are noted. IMPRESSION: No acute abnormality seen. Electronically Signed   By: Alcide CleverMark  Lukens M.D.   On: 01/24/2017 07:15     I have independently reviewed the above radiologic studies.  Recent Lab Findings: Lab Results  Component Value Date   WBC 6.1 01/24/2017   HGB 15.0 01/24/2017   HCT 45.1 01/24/2017   PLT 182 01/24/2017   GLUCOSE 94 01/24/2017   CHOL 169 01/20/2017   TRIG 92 01/20/2017   HDL 48 01/20/2017   LDLCALC 103 (H) 01/20/2017   ALT 35 01/24/2017   AST 37 01/24/2017   NA 140 01/24/2017   K 4.0 01/24/2017   CL 105 01/24/2017   CREATININE 1.17 01/24/2017   BUN 16 01/24/2017   CO2 28 01/24/2017   TSH 5.620 (H) 01/24/2017   INR 0.98 01/24/2017   HGBA1C 5.3 01/24/2017      Assessment / Plan:     AVR with bioprosthetic valve, CABG x2 sched   8/21         01/24/2017 2:32 PM

## 2017-01-24 NOTE — Progress Notes (Signed)
ANTICOAGULATION CONSULT NOTE - Follow Up Consult  Pharmacy Consult for heparin Indication: CAD, plan for CABG 01/29/17   Labs:  Recent Labs  01/22/17 0323  01/22/17 1954 01/23/17 0425 01/24/17 0512  HGB 15.0  --   --  14.3 15.0  HCT 44.3  --   --  42.5 45.1  PLT 193  --   --  185 182  LABPROT  --   --   --   --  13.0  INR  --   --   --   --  0.98  HEPARINUNFRC 0.26*  < > 0.46 0.51 0.50  CREATININE 1.06  --   --   --  1.17  < > = values in this interval not displayed.   Assessment: 76yo male on heparin 950 units/hr resumed 8/13 post-cath.  Left main and LAD and circumflex disease. Moderate aortic stenosis. Awaiting surgery CABG/AVR on 01/29/17 by Dr Morton PetersVan Tright..    Heparin level = 0.50, remains therapeutic on IV heparin 950 units/hr  CBC wnl, no bleeding noted   Goal of Therapy:  Heparin level 0.3-0.7 units/ml   Plan:  Continue heparin gtt at 950 units/hr  Daily HL and CBC.   Thank you Noah Delaineuth Soraya Paquette, RPh Clinical Pharmacist Pager: [phone] Main Pharmacy [phone] 01/24/2017,2:17 PM

## 2017-01-24 NOTE — Care Management Note (Signed)
Case Management Note  Patient Details  Name: Riki SheerLaverne Cuthbert MRN: [id number] Date of Birth: [birth year]  Subjective/Objective:                 Patient admitted w CP from home w wife. AVR with bioprosthetic valve, CABG x2 sched  8/21. Cards: Donnie Ahoilley PCP- None Listed Coverage [employer]   Action/Plan:  CM will continue to follow for DC planning.  Expected Discharge Date:  01/22/17               Expected Discharge Plan:  Home w Home Health Services  In-House Referral:     Discharge planning Services  CM Consult  Post Acute Care Choice:    Choice offered to:     DME Arranged:    DME Agency:     HH Arranged:    HH Agency:     Status of Service:  In process, will continue to follow  If discussed at Long Length of Stay Meetings, dates discussed:    Additional Comments:  Lawerance SabalDebbie Kaysey Berndt, RN 01/24/2017, 3:19 PM

## 2017-01-24 NOTE — Progress Notes (Signed)
Subjective:  No recurrent ischemic chest pain overnight.  Seen by surgeon yesterday as well as dental.    Objective:  Vital Signs in the last 24 hours: BP (!) 100/54 (BP Location: Left Arm)   Pulse 61   Temp 97.6 F (36.4 C) (Oral)   Resp 18   Ht 6' (1.829 m)   Wt 81.9 kg (180 lb 8 oz) Comment: b scale  SpO2 95%   BMI 24.48 kg/m   Physical Exam: Pleasant male in no acute distress Lungs:  Clear Cardiac:  Regular rhythm, normal S1 , preserved S2, 2/6 early to midsystolic murmur of aortic stenosis radiating to carotids Extremities: Catheterization site is clean and dry   Intake/Output from previous day: 08/15 0701 - 08/16 0700 In: 781.7 [P.O.:360; I.V.:421.7] Out: 1520 [Urine:1520]  Weight Filed Weights   01/22/17 0646 01/23/17 0558 01/24/17 0623  Weight: 81.6 kg (179 lb 14.4 oz) 82.1 kg (180 lb 14.4 oz) 81.9 kg (180 lb 8 oz)    Lab Results: Basic Metabolic Panel:  Recent Labs  16/03/9607/14/18 0323 01/24/17 0512  NA 139 140  K 3.9 4.0  CL 105 105  CO2 26 28  GLUCOSE 96 94  BUN 11 16  CREATININE 1.06 1.17   CBC:  Recent Labs  01/23/17 0425 01/24/17 0512  WBC 5.6 6.1  HGB 14.3 15.0  HCT 42.5 45.1  MCV 90.0 91.9  PLT 185 182   Cardiac Panel (last 3 results) No results for input(s): CKTOTAL, CKMB, TROPONINI, RELINDX in the last 72 hours.  Telemetry: Normal sinus rhythm personally reviewed  Assessment/Plan:  1.  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  Moderate aortic stenosis with mild regurgitation with gradient increased by echo since October 3.  Hyperlipidemia with statin intolerance  Recommendations:  Awaiting surgery.  Continues on heparin.    Darden PalmerW. Spencer Tameah Mihalko, Jr.  MD [facility] Cardiology  01/24/2017, 11:10 AM

## 2017-01-25 LAB — HEPARIN LEVEL (UNFRACTIONATED): Heparin Unfractionated: 0.37 IU/mL (ref 0.30–0.70)

## 2017-01-25 LAB — CBC
HEMATOCRIT: 38.8 % — AB (ref 39.0–52.0)
HEMOGLOBIN: 13.4 g/dL (ref 13.0–17.0)
MCH: 31.5 pg (ref 26.0–34.0)
MCHC: 34.5 g/dL (ref 30.0–36.0)
MCV: 91.3 fL (ref 78.0–100.0)
Platelets: 183 10*3/uL (ref 150–400)
RBC: 4.25 MIL/uL (ref 4.22–5.81)
RDW: 13.3 % (ref 11.5–15.5)
WBC: 5.1 10*3/uL (ref 4.0–10.5)

## 2017-01-25 NOTE — Care Management Important Message (Signed)
Important Message  Patient Details  Name: Jordan Hammond MRN: [id number] Date of Birth: [birth year]   Medicare Important Message Given:  Yes    [name] Abena 01/25/2017, 10:40 AM

## 2017-01-25 NOTE — Progress Notes (Signed)
Subjective:  No recurrent ischemic chest pain overnight.  Still awaiting surgery.  Objective:  Vital Signs in the last 24 hours: BP 107/69 (BP Location: Left Arm)   Pulse 70   Temp 97.6 F (36.4 C) (Oral)   Resp 18   Ht 6' (1.829 m)   Wt 82.1 kg (181 lb) Comment: b scale  SpO2 96%   BMI 24.55 kg/m   Physical Exam: Pleasant male in no acute distress Lungs:  Clear Cardiac:  Regular rhythm, normal S1 , preserved S2, 2/6 early to midsystolic murmur of aortic stenosis radiating to carotids Extremities: Catheterization site is clean and dry   Intake/Output from previous day: 08/16 0701 - 08/17 0700 In: 1036 [P.O.:960; I.V.:76] Out: 1050 [Urine:1050]  Weight Filed Weights   01/23/17 0558 01/24/17 0623 01/25/17 0519  Weight: 82.1 kg (180 lb 14.4 oz) 81.9 kg (180 lb 8 oz) 82.1 kg (181 lb)    Lab Results: Basic Metabolic Panel:  Recent Labs  82/95/62 0512  NA 140  K 4.0  CL 105  CO2 28  GLUCOSE 94  BUN 16  CREATININE 1.17   CBC:  Recent Labs  01/24/17 0512 01/25/17 0427  WBC 6.1 5.1  HGB 15.0 13.4  HCT 45.1 38.8*  MCV 91.9 91.3  PLT 182 183   Telemetry: Normal sinus rhythm personally reviewed  Assessment/Plan:  1.  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  Moderate aortic stenosis with mild regurgitation with gradient increased by echo since October 3.  Hyperlipidemia with statin intolerance  Recommendations:  Awaiting surgery.  Continues on heparin. No complaints.   Jordan Palmer  MD [facility] Cardiology  01/25/2017, 1:04 PM

## 2017-01-25 NOTE — Progress Notes (Signed)
CARDIAC REHAB PHASE I  Offered to walk with pt, pt states he is walking independently, declines ambulation with cardiac rehab at this time. Encouraged ambulation as tolerated.   Joylene Grapes, RN, BSN 01/25/2017 3:05 PM

## 2017-01-25 NOTE — Progress Notes (Signed)
ANTICOAGULATION CONSULT NOTE - Follow Up Consult  Pharmacy Consult for heparin Indication: CAD, plan for CABG 01/29/17   Labs:  Recent Labs  01/23/17 0425 01/24/17 0512 01/25/17 0427  HGB 14.3 15.0 13.4  HCT 42.5 45.1 38.8*  PLT 185 182 183  LABPROT  --  13.0  --   INR  --  0.98  --   HEPARINUNFRC 0.51 0.50 0.37  CREATININE  --  1.17  --      Assessment: 76yo male on heparin 950 units/hr resumed 8/13 post-cath.  Left main and LAD and circumflex disease. Moderate aortic stenosis. Awaiting surgery CABG/AVR on 01/29/17 by Dr Morton Peters..    Heparin level = 0.37, remains therapeutic on IV heparin 950 units/hr  CBC wnl, no bleeding noted   Goal of Therapy:  Heparin level 0.3-0.7 units/ml   Plan:  Continue heparin gtt at 950 units/hr  Daily HL and CBC.   Thank you Noah Delaine, RPh Clinical Pharmacist Pager: [phone]-4P [phone]-10P [phone] [facility] [phone] 01/25/2017,2:18 PM

## 2017-01-26 DIAGNOSIS — I2510 Atherosclerotic heart disease of native coronary artery without angina pectoris: Secondary | ICD-10-CM

## 2017-01-26 LAB — HEPARIN LEVEL (UNFRACTIONATED): Heparin Unfractionated: 0.47 IU/mL (ref 0.30–0.70)

## 2017-01-26 LAB — CBC
HEMATOCRIT: 40.9 % (ref 39.0–52.0)
HEMOGLOBIN: 14.1 g/dL (ref 13.0–17.0)
MCH: 31.5 pg (ref 26.0–34.0)
MCHC: 34.5 g/dL (ref 30.0–36.0)
MCV: 91.3 fL (ref 78.0–100.0)
Platelets: 184 10*3/uL (ref 150–400)
RBC: 4.48 MIL/uL (ref 4.22–5.81)
RDW: 13.1 % (ref 11.5–15.5)
WBC: 5.4 10*3/uL (ref 4.0–10.5)

## 2017-01-26 NOTE — Progress Notes (Signed)
ANTICOAGULATION CONSULT NOTE - Follow Up Consult  Pharmacy Consult for heparin Indication: CAD, plan for CABG 01/29/17  Labs:  Recent Labs  01/24/17 0512 01/25/17 0427 01/26/17 0448  HGB 15.0 13.4 14.1  HCT 45.1 38.8* 40.9  PLT 182 183 184  LABPROT 13.0  --   --   INR 0.98  --   --   HEPARINUNFRC 0.50 0.37 0.47  CREATININE 1.17  --   --     Assessment: 76yo male on heparin 950 units/hr resumed 8/13 post-cath.  Left main and LAD and circumflex disease. Moderate aortic stenosis. Awaiting surgery CABG/AVR on 01/29/17 by Dr Morton Peters..    Heparin level = 0.47, remains therapeutic on IV heparin 950 units/hr; CBC stable;  no bleeding or infusion issues reported  CBC wnl, no bleeding noted   Goal of Therapy:  Heparin level 0.3-0.7 units/ml   Plan:  Continue heparin gtt at 950 units/hr  Daily heparin level and CBC Monitor for s/sx of bleeding  Ruben Im, PharmD Clinical Pharmacist 01/26/2017 11:17 AM

## 2017-01-26 NOTE — Progress Notes (Signed)
Progress Note  Patient Name: Jordan Hammond Date of Encounter: 01/26/2017  Primary Cardiologist: Donnie Aho  Subjective   No CP, no SOB  Inpatient Medications    Scheduled Meds: . aspirin EC  81 mg Oral Daily  . calcium-vitamin D  1 tablet Oral Q breakfast  . chlorhexidine  15 mL Mouth/Throat BID  . clonazePAM  1 mg Oral QHS  . gabapentin  300 mg Oral QHS  . sodium chloride flush  3 mL Intravenous Q12H   Continuous Infusions: . sodium chloride    . heparin 950 Units/hr (01/26/17 0521)   PRN Meds: sodium chloride, acetaminophen, gi cocktail, morphine injection, nitroGLYCERIN, ondansetron (ZOFRAN) IV, sodium chloride flush   Vital Signs    Vitals:   01/25/17 0519 01/25/17 2257 01/26/17 0554 01/26/17 1151  BP: 107/69 (!) 141/68 118/64 129/76  Pulse: 70 68 69 72  Resp: 18 18 20 18   Temp: 97.6 F (36.4 C) (!) 97.5 F (36.4 C) 97.9 F (36.6 C) (!) 97.4 F (36.3 C)  TempSrc: Oral Oral Oral Oral  SpO2: 96% 99% 99% 100%  Weight: 181 lb (82.1 kg)  180 lb 8 oz (81.9 kg)   Height:        Intake/Output Summary (Last 24 hours) at 01/26/17 1319 Last data filed at 01/26/17 0918  Gross per 24 hour  Intake            837.5 ml  Output              550 ml  Net            287.5 ml   Filed Weights   01/24/17 0623 01/25/17 0519 01/26/17 0554  Weight: 180 lb 8 oz (81.9 kg) 181 lb (82.1 kg) 180 lb 8 oz (81.9 kg)    Telemetry    No adverse rhythms - Personally Reviewed  ECG    NSR - Personally Reviewed  Physical Exam   GEN: No acute distress.   Neck: No JVD Cardiac: RRR, 2/6 SEM RUSB,no rubs, or gallops.  Respiratory: Clear to auscultation bilaterally. GI: Soft, nontender, non-distended  MS: No edema; No deformity. Neuro:  Nonfocal  Psych: Normal affect   Labs    Chemistry Recent Labs Lab 01/20/17 1252 01/22/17 0323 01/24/17 0512  NA 137 139 140  K 3.6 3.9 4.0  CL 102 105 105  CO2 24 26 28   GLUCOSE 80 96 94  BUN 10 11 16   CREATININE 1.11 1.06 1.17    CALCIUM 9.3 9.3 9.6  PROT 6.8  --  6.6  ALBUMIN 4.2  --  3.9  AST 24  --  37  ALT 19  --  35  ALKPHOS 44  --  47  BILITOT 1.2  --  1.0  GFRNONAA >60 >60 59*  GFRAA >60 >60 >60  ANIONGAP 11 8 7      Hematology Recent Labs Lab 01/24/17 0512 01/25/17 0427 01/26/17 0448  WBC 6.1 5.1 5.4  RBC 4.91 4.25 4.48  HGB 15.0 13.4 14.1  HCT 45.1 38.8* 40.9  MCV 91.9 91.3 91.3  MCH 30.5 31.5 31.5  MCHC 33.3 34.5 34.5  RDW 13.3 13.3 13.1  PLT 182 183 184    Cardiac Enzymes Recent Labs Lab 01/20/17 0439 01/20/17 0807 01/20/17 1033  TROPONINI <0.03 <0.03 <0.03    Recent Labs Lab 01/19/17 2123  TROPIPOC 0.00     BNPNo results for input(s): BNP, PROBNP in the last 168 hours.   DDimer No results for input(s): DDIMER  in the last 168 hours.   Radiology    No results found.  Cardiac Studies   ECHO  - Left ventricle: The cavity size was normal. Wall thickness was   increased in a pattern of mild LVH. Systolic function was normal.   The estimated ejection fraction was in the range of 60% to 65%.   Wall motion was normal; there were no regional wall motion   abnormalities. - Aortic valve: Valve mobility was moderately restricted.   Transvalvular velocity was increased. There was moderate to   severe stenosis. There was mild regurgitation. Valve area (VTI):   0.63 cm^2. Valve area (Vmax): 0.63 cm^2. Valve area (Vmean): 0.65   cm^2. - Mitral valve: Valve area by pressure half-time: 1.98 cm^2.  Patient Profile     76 y.o. male with severe CAD, moderate AS awaiting CABG and AVR  Assessment & Plan    CAD/ [place]  - severe awaiting CABG. Can not go home because of mortality risk.   - continue IV heparin  - several questions answered  Moderate AS  - awaiting surgery  Hyperlipidemia  - had trouble with atorvastatin and zetia in the past  - Consider Crestor.    Signed, Donato Schultz, MD  01/26/2017, 1:19 PM

## 2017-01-27 LAB — CBC
HEMATOCRIT: 44.9 % (ref 39.0–52.0)
Hemoglobin: 15.4 g/dL (ref 13.0–17.0)
MCH: 31.5 pg (ref 26.0–34.0)
MCHC: 34.3 g/dL (ref 30.0–36.0)
MCV: 91.8 fL (ref 78.0–100.0)
PLATELETS: 221 10*3/uL (ref 150–400)
RBC: 4.89 MIL/uL (ref 4.22–5.81)
RDW: 13.2 % (ref 11.5–15.5)
WBC: 5.9 10*3/uL (ref 4.0–10.5)

## 2017-01-27 LAB — HEPARIN LEVEL (UNFRACTIONATED): Heparin Unfractionated: 0.61 IU/mL (ref 0.30–0.70)

## 2017-01-27 NOTE — Progress Notes (Signed)
ANTICOAGULATION CONSULT NOTE - Follow Up Consult  Pharmacy Consult for heparin Indication: CAD, plan for CABG 01/29/17  Labs:  Recent Labs  01/25/17 0427 01/26/17 0448 01/27/17 0407  HGB 13.4 14.1 15.4  HCT 38.8* 40.9 44.9  PLT 183 184 221  HEPARINUNFRC 0.37 0.47 0.61    Assessment: 76yo male s/p cardiac cath 8/13: left main and LAD and circumflex , Moderate aortic stenosis with mild regurgitation with gradient increased by echo since Oct, plan for CABG/AVR 8/21.  Goal of Therapy:  Heparin level 0.3-0.7 units/ml   Plan:  Continue heparin gtt at 950 units/hr  Monitor daily heparin level, CBC, s/s of bleed  Plan for CABG/AVR on Tues 8/21 (next week) by Dr. Vonda Antigua, PharmD, BCPS Clinical Pharmacist Pager [phone] 01/27/2017 10:29 AM

## 2017-01-27 NOTE — Progress Notes (Signed)
Progress Note  Patient Name: Jordan Hammond Date of Encounter: 01/27/2017  Primary Cardiologist: Donnie Aho  Subjective   No CP, no SOB. Talking with friend  Inpatient Medications    Scheduled Meds: . aspirin EC  81 mg Oral Daily  . calcium-vitamin D  1 tablet Oral Q breakfast  . chlorhexidine  15 mL Mouth/Throat BID  . clonazePAM  1 mg Oral QHS  . gabapentin  300 mg Oral QHS  . sodium chloride flush  3 mL Intravenous Q12H   Continuous Infusions: . sodium chloride    . heparin 950 Units/hr (01/27/17 0941)   PRN Meds: sodium chloride, acetaminophen, gi cocktail, morphine injection, nitroGLYCERIN, ondansetron (ZOFRAN) IV, sodium chloride flush   Vital Signs    Vitals:   01/26/17 0554 01/26/17 1151 01/26/17 1956 01/27/17 0509  BP: 118/64 129/76 115/88 106/63  Pulse: 69 72 77 66  Resp: 20 18 20 20   Temp: 97.9 F (36.6 C) (!) 97.4 F (36.3 C) 97.7 F (36.5 C) (!) 97.4 F (36.3 C)  TempSrc: Oral Oral Oral Oral  SpO2: 99% 100% 98% 96%  Weight: 180 lb 8 oz (81.9 kg)   179 lb 9.6 oz (81.5 kg)  Height:        Intake/Output Summary (Last 24 hours) at 01/27/17 1313 Last data filed at 01/27/17 0908  Gross per 24 hour  Intake          1195.44 ml  Output                0 ml  Net          1195.44 ml   Filed Weights   01/25/17 0519 01/26/17 0554 01/27/17 0509  Weight: 181 lb (82.1 kg) 180 lb 8 oz (81.9 kg) 179 lb 9.6 oz (81.5 kg)    Telemetry    No adverse rhythms - Personally Reviewed  ECG    NSR - Personally Reviewed  Physical Exam   GEN: Well nourished, well developed, in no acute distress  HEENT: normal  Neck: no JVD, carotid bruits, or masses Cardiac: RRR;  SEM 2/6 murmur, no rubs, or gallops,no edema  Respiratory:  clear to auscultation bilaterally, normal work of breathing GI: soft, nontender, nondistended, + BS MS: no deformity or atrophy  Skin: warm and dry, no rash Neuro:  Alert and Oriented x 3, Strength and sensation are intact Psych: euthymic  mood, full affect    Labs    Chemistry  Recent Labs Lab 01/22/17 0323 01/24/17 0512  NA 139 140  K 3.9 4.0  CL 105 105  CO2 26 28  GLUCOSE 96 94  BUN 11 16  CREATININE 1.06 1.17  CALCIUM 9.3 9.6  PROT  --  6.6  ALBUMIN  --  3.9  AST  --  37  ALT  --  35  ALKPHOS  --  47  BILITOT  --  1.0  GFRNONAA >60 59*  GFRAA >60 >60  ANIONGAP 8 7     Hematology  Recent Labs Lab 01/25/17 0427 01/26/17 0448 01/27/17 0407  WBC 5.1 5.4 5.9  RBC 4.25 4.48 4.89  HGB 13.4 14.1 15.4  HCT 38.8* 40.9 44.9  MCV 91.3 91.3 91.8  MCH 31.5 31.5 31.5  MCHC 34.5 34.5 34.3  RDW 13.3 13.1 13.2  PLT 183 184 221    Cardiac EnzymesNo results for input(s): TROPONINI in the last 168 hours. No results for input(s): TROPIPOC in the last 168 hours.   BNPNo results for input(s): BNP,  PROBNP in the last 168 hours.   DDimer No results for input(s): DDIMER in the last 168 hours.   Radiology    No results found.  Cardiac Studies   ECHO  - Left ventricle: The cavity size was normal. Wall thickness was   increased in a pattern of mild LVH. Systolic function was normal.   The estimated ejection fraction was in the range of 60% to 65%.   Wall motion was normal; there were no regional wall motion   abnormalities. - Aortic valve: Valve mobility was moderately restricted.   Transvalvular velocity was increased. There was moderate to   severe stenosis. There was mild regurgitation. Valve area (VTI):   0.63 cm^2. Valve area (Vmax): 0.63 cm^2. Valve area (Vmean): 0.65   cm^2. - Mitral valve: Valve area by pressure half-time: 1.98 cm^2.  Patient Profile     76 y.o. male with severe CAD, moderate AS awaiting CABG and AVR  Assessment & Plan    CAD/ [place]  - IV hep  - no further CP  - has severe CAD requiring CABG. Can not go home because of mortality risk  Moderate AS  - awaiting surgery, Dr. Maren Beach  Hyperlipidemia  - had trouble with atorvastatin and zetia in the past  - Consider  Crestor post op.    Signed, Donato Schultz, MD  01/27/2017, 1:13 PM

## 2017-01-27 NOTE — Progress Notes (Signed)
First vascular site assessment reveals bruising is now outside of initial marked boundary; possibly slightly more than yesterday. Pt denies pain; but makes note of a 'knot' he palpated in the inguinal crease, outside of area affected by the bruise. No tenderness is noted at the site of the 'knot' either.   There is no heat, and zero signs of active bleeding at this time. Pt does remain on heparin gtt, awaiting CABG Tuesday.

## 2017-01-28 ENCOUNTER — Inpatient Hospital Stay (HOSPITAL_COMMUNITY): Admit: 2017-01-28 | Payer: Medicare HMO

## 2017-01-28 ENCOUNTER — Inpatient Hospital Stay (HOSPITAL_COMMUNITY): Payer: Medicare HMO

## 2017-01-28 LAB — PULMONARY FUNCTION TEST
DL/VA % pred: 83 %
DL/VA: 4.03 ml/min/mmHg/L
DLCO cor % pred: 68 %
DLCO cor: 25.66 ml/min/mmHg
DLCO unc % pred: 68 %
DLCO unc: 25.66 ml/min/mmHg
FEF 25-75 Post: 2.02 L/sec
FEF 25-75 Pre: 2.62 L/sec
FEF2575-%Change-Post: -23 %
FEF2575-%Pred-Post: 79 %
FEF2575-%Pred-Pre: 103 %
FEV1-%Change-Post: -12 %
FEV1-%Pred-Post: 80 %
FEV1-%Pred-Pre: 92 %
FEV1-Post: 2.86 L
FEV1-Pre: 3.26 L
FEV1FVC-%Change-Post: -6 %
FEV1FVC-%Pred-Pre: 103 %
FEV6-%Change-Post: -6 %
FEV6-%Pred-Post: 87 %
FEV6-%Pred-Pre: 92 %
FEV6-Post: 3.99 L
FEV6-Pre: 4.25 L
FEV6FVC-%Change-Post: 0 %
FEV6FVC-%Pred-Post: 104 %
FEV6FVC-%Pred-Pre: 103 %
FVC-%Change-Post: -6 %
FVC-%Pred-Post: 83 %
FVC-%Pred-Pre: 89 %
FVC-Post: 4.08 L
FVC-Pre: 4.37 L
Post FEV1/FVC ratio: 70 %
Post FEV6/FVC ratio: 98 %
Pre FEV1/FVC ratio: 75 %
Pre FEV6/FVC Ratio: 97 %
RV % pred: 106 %
RV: 2.99 L
TLC % pred: 89 %
TLC: 7.05 L

## 2017-01-28 LAB — CBC
HEMATOCRIT: 42.6 % (ref 39.0–52.0)
HEMOGLOBIN: 14.6 g/dL (ref 13.0–17.0)
MCH: 31.3 pg (ref 26.0–34.0)
MCHC: 34.3 g/dL (ref 30.0–36.0)
MCV: 91.2 fL (ref 78.0–100.0)
PLATELETS: 205 10*3/uL (ref 150–400)
RBC: 4.67 MIL/uL (ref 4.22–5.81)
RDW: 13 % (ref 11.5–15.5)
WBC: 5.8 10*3/uL (ref 4.0–10.5)

## 2017-01-28 LAB — HEPARIN LEVEL (UNFRACTIONATED): Heparin Unfractionated: 0.49 IU/mL (ref 0.30–0.70)

## 2017-01-28 LAB — PREPARE RBC (CROSSMATCH)

## 2017-01-28 LAB — ABO/RH: ABO/RH(D): B POS

## 2017-01-28 MED ORDER — TRANEXAMIC ACID (OHS) BOLUS VIA INFUSION
15.0000 mg/kg | INTRAVENOUS | Status: AC
  Administered 2017-01-29: 1225.5 mg via INTRAVENOUS
  Filled 2017-01-28: qty 1226

## 2017-01-28 MED ORDER — PLASMA-LYTE 148 IV SOLN
INTRAVENOUS | Status: AC
  Administered 2017-01-29: 500 mL
  Filled 2017-01-28: qty 2.5

## 2017-01-28 MED ORDER — ALBUTEROL SULFATE (2.5 MG/3ML) 0.083% IN NEBU
2.5000 mg | INHALATION_SOLUTION | Freq: Once | RESPIRATORY_TRACT | Status: AC
  Administered 2017-01-28: 2.5 mg via RESPIRATORY_TRACT

## 2017-01-28 MED ORDER — DIAZEPAM 2 MG PO TABS
2.0000 mg | ORAL_TABLET | Freq: Once | ORAL | Status: AC
  Administered 2017-01-29: 2 mg via ORAL
  Filled 2017-01-28: qty 1

## 2017-01-28 MED ORDER — DEXTROSE 5 % IV SOLN
1.5000 g | INTRAVENOUS | Status: AC
  Administered 2017-01-29: .75 g via INTRAVENOUS
  Administered 2017-01-29: 1.5 g via INTRAVENOUS
  Filled 2017-01-28: qty 1.5

## 2017-01-28 MED ORDER — SODIUM CHLORIDE 0.9 % IV SOLN
INTRAVENOUS | Status: DC
  Filled 2017-01-28: qty 30

## 2017-01-28 MED ORDER — MAGNESIUM SULFATE 50 % IJ SOLN
40.0000 meq | INTRAMUSCULAR | Status: DC
  Filled 2017-01-28: qty 10

## 2017-01-28 MED ORDER — CHLORHEXIDINE GLUCONATE 0.12 % MT SOLN
15.0000 mL | Freq: Once | OROMUCOSAL | Status: AC
  Administered 2017-01-29: 15 mL via OROMUCOSAL
  Filled 2017-01-28: qty 15

## 2017-01-28 MED ORDER — METOPROLOL TARTRATE 12.5 MG HALF TABLET
12.5000 mg | ORAL_TABLET | Freq: Once | ORAL | Status: AC
  Administered 2017-01-29: 12.5 mg via ORAL
  Filled 2017-01-28: qty 1

## 2017-01-28 MED ORDER — TRANEXAMIC ACID 1000 MG/10ML IV SOLN
1.5000 mg/kg/h | INTRAVENOUS | Status: AC
  Administered 2017-01-29: 1.5 mg/kg/h via INTRAVENOUS
  Filled 2017-01-28: qty 25

## 2017-01-28 MED ORDER — SODIUM CHLORIDE 0.9 % IV SOLN
INTRAVENOUS | Status: AC
  Administered 2017-01-29: .7 [IU]/h via INTRAVENOUS
  Filled 2017-01-28: qty 1

## 2017-01-28 MED ORDER — VANCOMYCIN HCL 10 G IV SOLR
1250.0000 mg | INTRAVENOUS | Status: AC
  Administered 2017-01-29: 1250 mg via INTRAVENOUS
  Filled 2017-01-28: qty 1250

## 2017-01-28 MED ORDER — TRANEXAMIC ACID (OHS) PUMP PRIME SOLUTION
2.0000 mg/kg | INTRAVENOUS | Status: DC
  Filled 2017-01-28: qty 1.63

## 2017-01-28 MED ORDER — EPINEPHRINE PF 1 MG/ML IJ SOLN
0.0000 ug/min | INTRAVENOUS | Status: DC
  Filled 2017-01-28: qty 4

## 2017-01-28 MED ORDER — POTASSIUM CHLORIDE 2 MEQ/ML IV SOLN
80.0000 meq | INTRAVENOUS | Status: DC
  Filled 2017-01-28: qty 40

## 2017-01-28 MED ORDER — BISACODYL 5 MG PO TBEC
5.0000 mg | DELAYED_RELEASE_TABLET | Freq: Once | ORAL | Status: AC
  Administered 2017-01-28: 5 mg via ORAL
  Filled 2017-01-28: qty 1

## 2017-01-28 MED ORDER — DEXTROSE 5 % IV SOLN
750.0000 mg | INTRAVENOUS | Status: DC
  Filled 2017-01-28: qty 750

## 2017-01-28 MED ORDER — DEXMEDETOMIDINE HCL IN NACL 400 MCG/100ML IV SOLN
0.1000 ug/kg/h | INTRAVENOUS | Status: DC
  Filled 2017-01-28: qty 100

## 2017-01-28 MED ORDER — NITROGLYCERIN IN D5W 200-5 MCG/ML-% IV SOLN
2.0000 ug/min | INTRAVENOUS | Status: DC
  Filled 2017-01-28: qty 250

## 2017-01-28 MED ORDER — CHLORHEXIDINE GLUCONATE 4 % EX LIQD
60.0000 mL | Freq: Once | CUTANEOUS | Status: AC
  Administered 2017-01-28: 4 via TOPICAL
  Filled 2017-01-28: qty 60

## 2017-01-28 MED ORDER — DOPAMINE-DEXTROSE 3.2-5 MG/ML-% IV SOLN
0.0000 ug/kg/min | INTRAVENOUS | Status: DC
  Filled 2017-01-28: qty 250

## 2017-01-28 MED ORDER — TEMAZEPAM 15 MG PO CAPS: 15.0000 mg | ORAL_CAPSULE | Freq: Once | ORAL | Status: DC | PRN

## 2017-01-28 MED ORDER — SODIUM CHLORIDE 0.9 % IV SOLN
30.0000 ug/min | INTRAVENOUS | Status: DC
  Filled 2017-01-28: qty 2

## 2017-01-28 MED ORDER — CHLORHEXIDINE GLUCONATE 4 % EX LIQD
60.0000 mL | Freq: Once | CUTANEOUS | Status: AC
  Administered 2017-01-29: 4 via TOPICAL
  Filled 2017-01-28: qty 60

## 2017-01-28 NOTE — Progress Notes (Signed)
ANTICOAGULATION CONSULT NOTE - Follow Up Consult  Pharmacy Consult for heparin Indication: CAD, plan for CABG 01/29/17  Labs:  Recent Labs  01/26/17 0448 01/27/17 0407 01/28/17 0240  HGB 14.1 15.4 14.6  HCT 40.9 44.9 42.6  PLT 184 221 205  HEPARINUNFRC 0.47 0.61 0.49    Assessment: 76yo male s/p cardiac cath with plans for CABG / AVR 8/21 Heparin level therapeutic, CBC stable  Goal of Therapy:  Heparin level 0.3-0.7 units/ml   Plan:  Continue heparin gtt at 950 units/hr  Follow up after surgery 8/21  Thank you Okey Regal, PharmD [phone]  01/28/2017 10:42 AM

## 2017-01-28 NOTE — Progress Notes (Signed)
Subjective:  No recurrent ischemic chest pain overnight.  Surgery planned for the morning.  Objective:  Vital Signs in the last 24 hours: BP 138/82 (BP Location: Right Arm)   Pulse 78   Temp 98 F (36.7 C) (Oral)   Resp 18   Ht 6' (1.829 m)   Wt 81.7 kg (180 lb 1.6 oz)   SpO2 99%   BMI 24.43 kg/m   Physical Exam: Pleasant male in no acute distress Lungs:  Clear Cardiac:  Regular rhythm, normal S1 , preserved S2, 2/6 early to midsystolic murmur of aortic stenosis radiating to carotids Extremities: No edema  Intake/Output from previous day: 08/19 0701 - 08/20 0700 In: 970 [P.O.:720; I.V.:250] Out: 320 [Urine:320]  Weight Filed Weights   01/26/17 0554 01/27/17 0509 01/28/17 0539  Weight: 81.9 kg (180 lb 8 oz) 81.5 kg (179 lb 9.6 oz) 81.7 kg (180 lb 1.6 oz)    Lab Results: CBC:  Recent Labs  01/27/17 0407 01/28/17 0240  WBC 5.9 5.8  HGB 15.4 14.6  HCT 44.9 42.6  MCV 91.8 91.2  PLT 221 205   Telemetry: Normal sinus rhythm personally reviewed  Assessment/Plan:  1.  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  Moderate aortic stenosis with mild regurgitation with gradient increased by echo since October 3.  Hyperlipidemia with statin intolerance  Recommendations:  Plan CABG with bioprosthetic valve in the morning.  Questions answered.  Jordan Palmer  MD [facility] Cardiology  01/28/2017, 7:14 PM

## 2017-01-28 NOTE — Progress Notes (Signed)
7 Days Post-Op Procedure(s) (LRB): LEFT HEART CATH AND CORONARY ANGIOGRAPHY (N/A) RIGHT HEART CATH (N/A) Subjective: Stable, nsr on iv heparin  PFTs ok for sternotomy  Plan AVR-CABG in am Procedure d/w patient   Vital signs in last 24 hours: Temp:  [97.7 F (36.5 C)-98 F (36.7 C)] 98 F (36.7 C) (08/20 1311) Pulse Rate:  [78-82] 78 (08/20 1311) Cardiac Rhythm: Normal sinus rhythm (08/20 0700) Resp:  [18-20] 18 (08/20 1311) BP: (123-138)/(62-82) 138/82 (08/20 1311) SpO2:  [96 %-99 %] 99 % (08/20 1311) Weight:  [180 lb 1.6 oz (81.7 kg)] 180 lb 1.6 oz (81.7 kg) (08/20 0539)  Hemodynamic parameters for last 24 hours:  stable  Intake/Output from previous day: 08/19 0701 - 08/20 0700 In: 970 [P.O.:720; I.V.:250] Out: 320 [Urine:320] Intake/Output this shift: Total I/O In: 462 [P.O.:462] Out: 320 [Urine:320]       Exam    General- alert and comfortable   Lungs- clear without rales, wheezes   Cor- regular rate and rhythm, 3/6 AS murmur ,  No gallop   Abdomen- soft, non-tender   Extremities - warm, non-tender, minimal edema   Neuro- oriented, appropriate, no focal weakness   Lab Results:  Recent Labs  01/27/17 0407 01/28/17 0240  WBC 5.9 5.8  HGB 15.4 14.6  HCT 44.9 42.6  PLT 221 205   BMET: No results for input(s): NA, K, CL, CO2, GLUCOSE, BUN, CREATININE, CALCIUM in the last 72 hours.  PT/INR: No results for input(s): LABPROT, INR in the last 72 hours. ABG    Component Value Date/Time   PHART 7.427 01/21/2017 1422   HCO3 27.6 01/21/2017 1424   TCO2 29 01/21/2017 1424   O2SAT 69.0 01/21/2017 1424   CBG (last 3)  No results for input(s): GLUCAP in the last 72 hours.  Assessment/Plan: S/P Procedure(s) (LRB): LEFT HEART CATH AND CORONARY ANGIOGRAPHY (N/A) RIGHT HEART CATH (N/A)  CABG - AVR in am Will use bioprosthetic valve   LOS: 7 days    Jordan Hammond 01/28/2017

## 2017-01-28 NOTE — Anesthesia Preprocedure Evaluation (Addendum)
Anesthesia Evaluation  Patient identified by MRN, date of birth, ID band Patient awake    Reviewed: Allergy & Precautions, NPO status , Patient's Chart, lab work & pertinent test results  History of Anesthesia Complications Negative for: history of anesthetic complications  Airway Mallampati: III  TM Distance: <3 FB Neck ROM: Full    Dental  (+) Dental Advisory Given   Pulmonary neg pulmonary ROS,    breath sounds clear to auscultation       Cardiovascular + angina + CAD  + Valvular Problems/Murmurs AS  Rhythm:Regular Rate:Normal  01/21/17 Cath: 70% L main, 70% LAD, 50% Cx 01/20/17 ECHO: EF 60-65%, severe AS with AVA 0.63 (VTI), peak grad 53 mmHg, mean grad 28 mmHg   Neuro/Psych negative neurological ROS     GI/Hepatic Neg liver ROS, GERD  Controlled,  Endo/Other  negative endocrine ROS  Renal/GU negative Renal ROS     Musculoskeletal   Abdominal   Peds  Hematology negative hematology ROS (+) plt 217k   Anesthesia Other Findings   Reproductive/Obstetrics                            Anesthesia Physical Anesthesia Plan  ASA: IV  Anesthesia Plan: General   Post-op Pain Management:    Induction: Intravenous  PONV Risk Score and Plan: 2 and Treatment may vary due to age or medical condition  Airway Management Planned: Oral ETT and Video Laryngoscope Planned  Additional Equipment: Arterial line, CVP, PA Cath, TEE and Ultrasound Guidance Line Placement  Intra-op Plan:   Post-operative Plan: Post-operative intubation/ventilation  Informed Consent: I have reviewed the patients History and Physical, chart, labs and discussed the procedure including the risks, benefits and alternatives for the proposed anesthesia with the patient or authorized representative who has indicated his/her understanding and acceptance.   Dental advisory given  Plan Discussed with: CRNA and  Surgeon  Anesthesia Plan Comments: (Plan routine monitors, A line, PA cath, GETA with TEE and post op vent)        Anesthesia Quick Evaluation

## 2017-01-29 ENCOUNTER — Inpatient Hospital Stay (HOSPITAL_COMMUNITY): Payer: Medicare HMO | Admitting: Certified Registered Nurse Anesthetist

## 2017-01-29 ENCOUNTER — Inpatient Hospital Stay (HOSPITAL_COMMUNITY): Payer: Medicare HMO

## 2017-01-29 ENCOUNTER — Encounter (HOSPITAL_COMMUNITY)
Admission: EM | Disposition: A | Discharge status: Home / Self Care | Payer: Self-pay | Source: Home / Self Care | Attending: Cardiology

## 2017-01-29 DIAGNOSIS — I350 Nonrheumatic aortic (valve) stenosis: Secondary | ICD-10-CM

## 2017-01-29 DIAGNOSIS — I25110 Atherosclerotic heart disease of native coronary artery with unstable angina pectoris: Secondary | ICD-10-CM

## 2017-01-29 DIAGNOSIS — Z952 Presence of prosthetic heart valve: Secondary | ICD-10-CM

## 2017-01-29 HISTORY — PX: CORONARY ARTERY BYPASS GRAFT: SHX141

## 2017-01-29 HISTORY — PX: TEE WITHOUT CARDIOVERSION: SHX5443

## 2017-01-29 HISTORY — PX: AORTIC VALVE REPLACEMENT: SHX41

## 2017-01-29 LAB — POCT I-STAT, CHEM 8
BUN: 25 mg/dL — ABNORMAL HIGH (ref 6–20)
BUN: 23 mg/dL — ABNORMAL HIGH (ref 6–20)
BUN: 21 mg/dL — ABNORMAL HIGH (ref 6–20)
BUN: 20 mg/dL (ref 6–20)
BUN: 20 mg/dL (ref 6–20)
BUN: 18 mg/dL (ref 6–20)
CALCIUM ION: 1.26 mmol/L (ref 1.15–1.40)
CALCIUM ION: 1.27 mmol/L (ref 1.15–1.40)
CHLORIDE: 103 mmol/L (ref 101–111)
CHLORIDE: 103 mmol/L (ref 101–111)
CREATININE: 0.8 mg/dL (ref 0.61–1.24)
Calcium, Ion: 1.15 mmol/L (ref 1.15–1.40)
Calcium, Ion: 1.11 mmol/L — ABNORMAL LOW (ref 1.15–1.40)
Calcium, Ion: 1.11 mmol/L — ABNORMAL LOW (ref 1.15–1.40)
Calcium, Ion: 1.2 mmol/L (ref 1.15–1.40)
Chloride: 104 mmol/L (ref 101–111)
Chloride: 99 mmol/L — ABNORMAL LOW (ref 101–111)
Chloride: 104 mmol/L (ref 101–111)
Chloride: 105 mmol/L (ref 101–111)
Creatinine, Ser: 0.7 mg/dL (ref 0.61–1.24)
Creatinine, Ser: 0.7 mg/dL (ref 0.61–1.24)
Creatinine, Ser: 0.8 mg/dL (ref 0.61–1.24)
Creatinine, Ser: 0.8 mg/dL (ref 0.61–1.24)
Creatinine, Ser: 0.9 mg/dL (ref 0.61–1.24)
GLUCOSE: 107 mg/dL — AB (ref 65–99)
GLUCOSE: 138 mg/dL — AB (ref 65–99)
Glucose, Bld: 94 mg/dL (ref 65–99)
Glucose, Bld: 116 mg/dL — ABNORMAL HIGH (ref 65–99)
Glucose, Bld: 120 mg/dL — ABNORMAL HIGH (ref 65–99)
Glucose, Bld: 148 mg/dL — ABNORMAL HIGH (ref 65–99)
HCT: 27 % — ABNORMAL LOW (ref 39.0–52.0)
HCT: 29 % — ABNORMAL LOW (ref 39.0–52.0)
HCT: 30 % — ABNORMAL LOW (ref 39.0–52.0)
HCT: 27 % — ABNORMAL LOW (ref 39.0–52.0)
HEMATOCRIT: 38 % — AB (ref 39.0–52.0)
HEMATOCRIT: 35 % — AB (ref 39.0–52.0)
HEMOGLOBIN: 11.9 g/dL — AB (ref 13.0–17.0)
HEMOGLOBIN: 9.2 g/dL — AB (ref 13.0–17.0)
Hemoglobin: 12.9 g/dL — ABNORMAL LOW (ref 13.0–17.0)
Hemoglobin: 10.2 g/dL — ABNORMAL LOW (ref 13.0–17.0)
Hemoglobin: 9.9 g/dL — ABNORMAL LOW (ref 13.0–17.0)
Hemoglobin: 9.2 g/dL — ABNORMAL LOW (ref 13.0–17.0)
POTASSIUM: 4.1 mmol/L (ref 3.5–5.1)
POTASSIUM: 4.7 mmol/L (ref 3.5–5.1)
POTASSIUM: 4.3 mmol/L (ref 3.5–5.1)
Potassium: 4 mmol/L (ref 3.5–5.1)
Potassium: 4.1 mmol/L (ref 3.5–5.1)
Potassium: 4.7 mmol/L (ref 3.5–5.1)
SODIUM: 140 mmol/L (ref 135–145)
SODIUM: 139 mmol/L (ref 135–145)
Sodium: 134 mmol/L — ABNORMAL LOW (ref 135–145)
Sodium: 138 mmol/L (ref 135–145)
Sodium: 139 mmol/L (ref 135–145)
Sodium: 139 mmol/L (ref 135–145)
TCO2: 29 mmol/L (ref 0–100)
TCO2: 27 mmol/L (ref 0–100)
TCO2: 26 mmol/L (ref 0–100)
TCO2: 23 mmol/L (ref 0–100)
TCO2: 24 mmol/L (ref 0–100)
TCO2: 24 mmol/L (ref 0–100)

## 2017-01-29 LAB — GLUCOSE, CAPILLARY
GLUCOSE-CAPILLARY: 95 mg/dL (ref 65–99)
Glucose-Capillary: 114 mg/dL — ABNORMAL HIGH (ref 65–99)
Glucose-Capillary: 108 mg/dL — ABNORMAL HIGH (ref 65–99)
Glucose-Capillary: 112 mg/dL — ABNORMAL HIGH (ref 65–99)

## 2017-01-29 LAB — POCT I-STAT 3, ART BLOOD GAS (G3+)
ACID-BASE EXCESS: 3 mmol/L — AB (ref 0.0–2.0)
Acid-base deficit: 1 mmol/L (ref 0.0–2.0)
Acid-base deficit: 3 mmol/L — ABNORMAL HIGH (ref 0.0–2.0)
Acid-base deficit: 3 mmol/L — ABNORMAL HIGH (ref 0.0–2.0)
BICARBONATE: 24.8 mmol/L (ref 20.0–28.0)
Bicarbonate: 27.8 mmol/L (ref 20.0–28.0)
Bicarbonate: 24.6 mmol/L (ref 20.0–28.0)
Bicarbonate: 23.4 mmol/L (ref 20.0–28.0)
Bicarbonate: 23.3 mmol/L (ref 20.0–28.0)
O2 Saturation: 100 %
O2 Saturation: 100 %
O2 Saturation: 99 %
O2 Saturation: 98 %
O2 Saturation: 98 %
PH ART: 7.411 (ref 7.350–7.450)
PO2 ART: 320 mmHg — AB (ref 83.0–108.0)
Patient temperature: 35.6
Patient temperature: 36.8
Patient temperature: 36.9
TCO2: 29 mmol/L (ref 0–100)
TCO2: 26 mmol/L (ref 0–100)
TCO2: 26 mmol/L (ref 0–100)
TCO2: 25 mmol/L (ref 0–100)
TCO2: 25 mmol/L (ref 0–100)
pCO2 arterial: 45.9 mmHg (ref 32.0–48.0)
pCO2 arterial: 39 mmHg (ref 32.0–48.0)
pCO2 arterial: 43.1 mmHg (ref 32.0–48.0)
pCO2 arterial: 47.9 mmHg (ref 32.0–48.0)
pCO2 arterial: 46.2 mmHg (ref 32.0–48.0)
pH, Arterial: 7.391 (ref 7.350–7.450)
pH, Arterial: 7.358 (ref 7.350–7.450)
pH, Arterial: 7.295 — ABNORMAL LOW (ref 7.350–7.450)
pH, Arterial: 7.31 — ABNORMAL LOW (ref 7.350–7.450)
pO2, Arterial: 378 mmHg — ABNORMAL HIGH (ref 83.0–108.0)
pO2, Arterial: 166 mmHg — ABNORMAL HIGH (ref 83.0–108.0)
pO2, Arterial: 127 mmHg — ABNORMAL HIGH (ref 83.0–108.0)
pO2, Arterial: 117 mmHg — ABNORMAL HIGH (ref 83.0–108.0)

## 2017-01-29 LAB — CBC
HCT: 43 % (ref 39.0–52.0)
HCT: 29.6 % — ABNORMAL LOW (ref 39.0–52.0)
HEMATOCRIT: 31.1 % — AB (ref 39.0–52.0)
HEMOGLOBIN: 14.6 g/dL (ref 13.0–17.0)
Hemoglobin: 10.5 g/dL — ABNORMAL LOW (ref 13.0–17.0)
Hemoglobin: 10.1 g/dL — ABNORMAL LOW (ref 13.0–17.0)
MCH: 31.1 pg (ref 26.0–34.0)
MCH: 30.7 pg (ref 26.0–34.0)
MCH: 31 pg (ref 26.0–34.0)
MCHC: 34 g/dL (ref 30.0–36.0)
MCHC: 33.8 g/dL (ref 30.0–36.0)
MCHC: 34.1 g/dL (ref 30.0–36.0)
MCV: 91.5 fL (ref 78.0–100.0)
MCV: 90.9 fL (ref 78.0–100.0)
MCV: 90.8 fL (ref 78.0–100.0)
Platelets: 217 10*3/uL (ref 150–400)
Platelets: 102 10*3/uL — ABNORMAL LOW (ref 150–400)
Platelets: 114 10*3/uL — ABNORMAL LOW (ref 150–400)
RBC: 4.7 MIL/uL (ref 4.22–5.81)
RBC: 3.42 MIL/uL — ABNORMAL LOW (ref 4.22–5.81)
RBC: 3.26 MIL/uL — ABNORMAL LOW (ref 4.22–5.81)
RDW: 13.3 % (ref 11.5–15.5)
RDW: 13.3 % (ref 11.5–15.5)
RDW: 13.2 % (ref 11.5–15.5)
WBC: 6.1 10*3/uL (ref 4.0–10.5)
WBC: 6.2 10*3/uL (ref 4.0–10.5)
WBC: 6.6 10*3/uL (ref 4.0–10.5)

## 2017-01-29 LAB — PROTIME-INR
INR: 1.46
Prothrombin Time: 17.9 seconds — ABNORMAL HIGH (ref 11.4–15.2)

## 2017-01-29 LAB — POCT I-STAT 4, (NA,K, GLUC, HGB,HCT)
Glucose, Bld: 118 mg/dL — ABNORMAL HIGH (ref 65–99)
HCT: 28 % — ABNORMAL LOW (ref 39.0–52.0)
Hemoglobin: 9.5 g/dL — ABNORMAL LOW (ref 13.0–17.0)
Potassium: 4.2 mmol/L (ref 3.5–5.1)
Sodium: 140 mmol/L (ref 135–145)

## 2017-01-29 LAB — BASIC METABOLIC PANEL
Anion gap: 8 (ref 5–15)
BUN: 24 mg/dL — ABNORMAL HIGH (ref 6–20)
CO2: 27 mmol/L (ref 22–32)
Calcium: 9.6 mg/dL (ref 8.9–10.3)
Chloride: 104 mmol/L (ref 101–111)
Creatinine, Ser: 1.11 mg/dL (ref 0.61–1.24)
GFR calc Af Amer: >60 mL/min (ref >60)
GFR calc non Af Amer: >60 mL/min (ref >60)
Glucose, Bld: 103 mg/dL — ABNORMAL HIGH (ref 65–99)
Potassium: 4 mmol/L (ref 3.5–5.1)
Sodium: 139 mmol/L (ref 135–145)

## 2017-01-29 LAB — CREATININE, SERUM
Creatinine, Ser: 0.97 mg/dL (ref 0.61–1.24)
GFR calc Af Amer: >60 mL/min (ref >60)
GFR calc non Af Amer: >60 mL/min (ref >60)

## 2017-01-29 LAB — HEMOGLOBIN AND HEMATOCRIT, BLOOD
HCT: 29.6 % — ABNORMAL LOW (ref 39.0–52.0)
Hemoglobin: 9.9 g/dL — ABNORMAL LOW (ref 13.0–17.0)

## 2017-01-29 LAB — HEPARIN LEVEL (UNFRACTIONATED): Heparin Unfractionated: 0.44 IU/mL (ref 0.30–0.70)

## 2017-01-29 LAB — APTT: APTT: 33 s (ref 24–36)

## 2017-01-29 LAB — PLATELET COUNT: Platelets: 152 10*3/uL (ref 150–400)

## 2017-01-29 LAB — MAGNESIUM: Magnesium: 2.9 mg/dL — ABNORMAL HIGH (ref 1.7–2.4)

## 2017-01-29 SURGERY — REPLACEMENT, AORTIC VALVE, OPEN
Anesthesia: General | Site: Chest

## 2017-01-29 MED ORDER — PROTAMINE SULFATE 10 MG/ML IV SOLN
INTRAVENOUS | Status: DC | PRN
  Administered 2017-01-29: 300 mg via INTRAVENOUS

## 2017-01-29 MED ORDER — LACTATED RINGERS IV SOLN: INTRAVENOUS | Status: DC

## 2017-01-29 MED ORDER — ACETAMINOPHEN 160 MG/5ML PO SOLN: 650.0000 mg | Freq: Once | ORAL | Status: AC

## 2017-01-29 MED ORDER — GABAPENTIN 300 MG PO CAPS
300.0000 mg | ORAL_CAPSULE | Freq: Every day | ORAL | Status: DC
  Administered 2017-01-30 – 2017-02-03 (×5): 300 mg via ORAL
  Filled 2017-01-29 (×6): qty 1

## 2017-01-29 MED ORDER — TRAMADOL HCL 50 MG PO TABS
50.0000 mg | ORAL_TABLET | ORAL | Status: DC | PRN
  Administered 2017-01-30: 100 mg via ORAL
  Administered 2017-01-30: 50 mg via ORAL
  Administered 2017-01-30 (×2): 100 mg via ORAL
  Filled 2017-01-29: qty 1
  Filled 2017-01-29 (×3): qty 2

## 2017-01-29 MED ORDER — HEPARIN SODIUM (PORCINE) 1000 UNIT/ML IJ SOLN
INTRAMUSCULAR | Status: DC | PRN
  Administered 2017-01-29: 32000 [IU] via INTRAVENOUS
  Administered 2017-01-29: 3000 [IU] via INTRAVENOUS

## 2017-01-29 MED ORDER — MAGNESIUM SULFATE 4 GM/100ML IV SOLN
4.0000 g | Freq: Once | INTRAVENOUS | Status: AC
  Administered 2017-01-29: 4 g via INTRAVENOUS
  Filled 2017-01-29: qty 100

## 2017-01-29 MED ORDER — PROPOFOL 10 MG/ML IV BOLUS
INTRAVENOUS | Status: DC | PRN
  Administered 2017-01-29: 20 mg via INTRAVENOUS

## 2017-01-29 MED ORDER — CLONAZEPAM 1 MG PO TABS
1.0000 mg | ORAL_TABLET | Freq: Every day | ORAL | Status: DC
  Administered 2017-01-30 – 2017-02-03 (×5): 1 mg via ORAL
  Filled 2017-01-29 (×5): qty 1

## 2017-01-29 MED ORDER — MORPHINE SULFATE (PF) 2 MG/ML IV SOLN: 2.0000 mg | INTRAVENOUS | Status: DC | PRN

## 2017-01-29 MED ORDER — FAMOTIDINE IN NACL 20-0.9 MG/50ML-% IV SOLN
20.0000 mg | Freq: Two times a day (BID) | INTRAVENOUS | Status: DC
  Administered 2017-01-29: 20 mg via INTRAVENOUS

## 2017-01-29 MED ORDER — MIDAZOLAM HCL 10 MG/2ML IJ SOLN
INTRAMUSCULAR | Status: AC
  Filled 2017-01-29: qty 2

## 2017-01-29 MED ORDER — BISACODYL 5 MG PO TBEC
10.0000 mg | DELAYED_RELEASE_TABLET | Freq: Every day | ORAL | Status: DC
  Administered 2017-01-30 – 2017-02-04 (×4): 10 mg via ORAL
  Filled 2017-01-29 (×3): qty 2

## 2017-01-29 MED ORDER — MORPHINE SULFATE (PF) 2 MG/ML IV SOLN: 1.0000 mg | INTRAVENOUS | Status: DC | PRN

## 2017-01-29 MED ORDER — SODIUM CHLORIDE 0.9% FLUSH: 3.0000 mL | INTRAVENOUS | Status: DC | PRN

## 2017-01-29 MED ORDER — HEPARIN SODIUM (PORCINE) 1000 UNIT/ML IJ SOLN
INTRAMUSCULAR | Status: AC
  Filled 2017-01-29: qty 1

## 2017-01-29 MED ORDER — SODIUM CHLORIDE 0.9 % IV SOLN
INTRAVENOUS | Status: DC | PRN
  Administered 2017-01-29: 20 ug/min via INTRAVENOUS

## 2017-01-29 MED ORDER — ASPIRIN EC 81 MG PO TBEC
81.0000 mg | DELAYED_RELEASE_TABLET | Freq: Every day | ORAL | Status: DC
  Administered 2017-01-30 – 2017-02-04 (×6): 81 mg via ORAL
  Filled 2017-01-29 (×6): qty 1

## 2017-01-29 MED ORDER — EPHEDRINE SULFATE 50 MG/ML IJ SOLN
INTRAMUSCULAR | Status: DC | PRN
  Administered 2017-01-29 (×2): 5 mg via INTRAVENOUS
  Administered 2017-01-29 (×2): 10 mg via INTRAVENOUS

## 2017-01-29 MED ORDER — INSULIN REGULAR BOLUS VIA INFUSION
0.0000 [IU] | Freq: Three times a day (TID) | INTRAVENOUS | Status: DC
  Filled 2017-01-29: qty 10

## 2017-01-29 MED ORDER — ROCURONIUM BROMIDE 10 MG/ML (PF) SYRINGE
PREFILLED_SYRINGE | INTRAVENOUS | Status: AC
  Filled 2017-01-29: qty 10

## 2017-01-29 MED ORDER — SODIUM CHLORIDE 0.45 % IV SOLN
INTRAVENOUS | Status: DC | PRN
  Administered 2017-01-29 – 2017-01-30 (×2): via INTRAVENOUS

## 2017-01-29 MED ORDER — FENTANYL CITRATE (PF) 100 MCG/2ML IJ SOLN
INTRAMUSCULAR | Status: DC | PRN
  Administered 2017-01-29: 400 ug via INTRAVENOUS
  Administered 2017-01-29: 100 ug via INTRAVENOUS
  Administered 2017-01-29 (×3): 250 ug via INTRAVENOUS

## 2017-01-29 MED ORDER — THROMBIN 5000 UNITS EX SOLR
CUTANEOUS | Status: DC | PRN
  Administered 2017-01-29: 5000 [IU] via TOPICAL

## 2017-01-29 MED ORDER — DOCUSATE SODIUM 100 MG PO CAPS
200.0000 mg | ORAL_CAPSULE | Freq: Every day | ORAL | Status: DC
  Administered 2017-01-30 – 2017-02-04 (×4): 200 mg via ORAL
  Filled 2017-01-29 (×4): qty 2

## 2017-01-29 MED ORDER — METOPROLOL TARTRATE 25 MG/10 ML ORAL SUSPENSION: 12.5000 mg | Freq: Two times a day (BID) | ORAL | Status: DC

## 2017-01-29 MED ORDER — OXYCODONE HCL 5 MG PO TABS
5.0000 mg | ORAL_TABLET | ORAL | Status: DC | PRN
  Administered 2017-01-30 (×2): 5 mg via ORAL
  Administered 2017-01-30: 10 mg via ORAL
  Administered 2017-01-30: 5 mg via ORAL
  Administered 2017-01-30 – 2017-01-31 (×2): 10 mg via ORAL
  Filled 2017-01-29 (×2): qty 2
  Filled 2017-01-29 (×5): qty 1

## 2017-01-29 MED ORDER — PHENYLEPHRINE HCL 10 MG/ML IJ SOLN
INTRAMUSCULAR | Status: DC | PRN
  Administered 2017-01-29: 40 ug via INTRAVENOUS
  Administered 2017-01-29: 80 ug via INTRAVENOUS

## 2017-01-29 MED ORDER — HEMOSTATIC AGENTS (NO CHARGE) OPTIME
TOPICAL | Status: DC | PRN
  Administered 2017-01-29 (×3): 1 via TOPICAL

## 2017-01-29 MED ORDER — SODIUM CHLORIDE 0.9 % IV SOLN
20.0000 ug | INTRAVENOUS | Status: AC
  Administered 2017-01-29: 20 ug via INTRAVENOUS
  Filled 2017-01-29: qty 5

## 2017-01-29 MED ORDER — SODIUM CHLORIDE 0.9 % IV SOLN: 250.0000 mL | INTRAVENOUS | Status: DC

## 2017-01-29 MED ORDER — LACTATED RINGERS IV SOLN
INTRAVENOUS | Status: DC | PRN
  Administered 2017-01-29: 07:00:00 via INTRAVENOUS

## 2017-01-29 MED ORDER — CHLORHEXIDINE GLUCONATE 0.12% ORAL RINSE (MEDLINE KIT)
15.0000 mL | Freq: Two times a day (BID) | OROMUCOSAL | Status: DC
  Administered 2017-01-29: 15 mL via OROMUCOSAL

## 2017-01-29 MED ORDER — MIDAZOLAM HCL 2 MG/2ML IJ SOLN
INTRAMUSCULAR | Status: AC
  Filled 2017-01-29: qty 2

## 2017-01-29 MED ORDER — FENTANYL CITRATE (PF) 250 MCG/5ML IJ SOLN
INTRAMUSCULAR | Status: AC
  Filled 2017-01-29: qty 25

## 2017-01-29 MED ORDER — SODIUM CHLORIDE 0.9 % IV SOLN
0.0000 ug/kg/h | INTRAVENOUS | Status: DC
  Administered 2017-01-29: 0.5 ug/kg/h via INTRAVENOUS
  Filled 2017-01-29: qty 2

## 2017-01-29 MED ORDER — PHENYLEPHRINE 40 MCG/ML (10ML) SYRINGE FOR IV PUSH (FOR BLOOD PRESSURE SUPPORT)
PREFILLED_SYRINGE | INTRAVENOUS | Status: AC
  Filled 2017-01-29: qty 10

## 2017-01-29 MED ORDER — MORPHINE SULFATE (PF) 4 MG/ML IV SOLN
1.0000 mg | INTRAVENOUS | Status: DC | PRN
  Administered 2017-01-29: 2 mg via INTRAVENOUS
  Filled 2017-01-29: qty 1

## 2017-01-29 MED ORDER — CHLORHEXIDINE GLUCONATE 0.12 % MT SOLN
15.0000 mL | OROMUCOSAL | Status: AC
Start: 2017-01-29 — End: 2017-01-29
  Administered 2017-01-29: 15 mL via OROMUCOSAL

## 2017-01-29 MED ORDER — SODIUM CHLORIDE 0.9% FLUSH
3.0000 mL | Freq: Two times a day (BID) | INTRAVENOUS | Status: DC
  Administered 2017-01-30 – 2017-02-03 (×6): 3 mL via INTRAVENOUS

## 2017-01-29 MED ORDER — 0.9 % SODIUM CHLORIDE (POUR BTL) OPTIME
TOPICAL | Status: DC | PRN
  Administered 2017-01-29: 6000 mL

## 2017-01-29 MED ORDER — PROPOFOL 10 MG/ML IV BOLUS
INTRAVENOUS | Status: AC
  Filled 2017-01-29: qty 20

## 2017-01-29 MED ORDER — ONDANSETRON HCL 4 MG/2ML IJ SOLN
4.0000 mg | Freq: Four times a day (QID) | INTRAMUSCULAR | Status: DC | PRN
  Administered 2017-01-29 – 2017-01-30 (×2): 4 mg via INTRAVENOUS
  Filled 2017-01-29 (×2): qty 2

## 2017-01-29 MED ORDER — ACETAMINOPHEN 500 MG PO TABS
1000.0000 mg | ORAL_TABLET | Freq: Four times a day (QID) | ORAL | Status: AC
  Administered 2017-01-30 – 2017-02-03 (×17): 1000 mg via ORAL
  Filled 2017-01-29 (×19): qty 2

## 2017-01-29 MED ORDER — METOPROLOL TARTRATE 12.5 MG HALF TABLET
12.5000 mg | ORAL_TABLET | Freq: Two times a day (BID) | ORAL | Status: DC
  Administered 2017-01-30 – 2017-02-02 (×4): 12.5 mg via ORAL
  Filled 2017-01-29 (×5): qty 1

## 2017-01-29 MED ORDER — SODIUM CHLORIDE 0.9 % IJ SOLN
OROMUCOSAL | Status: DC | PRN
  Administered 2017-01-29 (×4): 4 mL via TOPICAL

## 2017-01-29 MED ORDER — ORAL CARE MOUTH RINSE
15.0000 mL | Freq: Four times a day (QID) | OROMUCOSAL | Status: DC
  Administered 2017-01-30 – 2017-02-02 (×11): 15 mL via OROMUCOSAL

## 2017-01-29 MED ORDER — ALBUMIN HUMAN 5 % IV SOLN
INTRAVENOUS | Status: DC | PRN
  Administered 2017-01-29 (×2): via INTRAVENOUS

## 2017-01-29 MED ORDER — VANCOMYCIN HCL IN DEXTROSE 1-5 GM/200ML-% IV SOLN
1000.0000 mg | Freq: Once | INTRAVENOUS | Status: AC
  Administered 2017-01-29: 1000 mg via INTRAVENOUS
  Filled 2017-01-29: qty 200

## 2017-01-29 MED ORDER — BISACODYL 10 MG RE SUPP: 10.0000 mg | Freq: Every day | RECTAL | Status: DC

## 2017-01-29 MED ORDER — ALBUMIN HUMAN 5 % IV SOLN
250.0000 mL | INTRAVENOUS | Status: AC | PRN
  Administered 2017-01-29 – 2017-01-30 (×4): 250 mL via INTRAVENOUS
  Filled 2017-01-29 (×5): qty 250

## 2017-01-29 MED ORDER — SODIUM CHLORIDE 0.9 % IV SOLN
INTRAVENOUS | Status: DC
  Administered 2017-01-29 – 2017-01-30 (×2): via INTRAVENOUS

## 2017-01-29 MED ORDER — MIDAZOLAM HCL 5 MG/5ML IJ SOLN
INTRAMUSCULAR | Status: DC | PRN
  Administered 2017-01-29: 2 mg via INTRAVENOUS
  Administered 2017-01-29: 1 mg via INTRAVENOUS
  Administered 2017-01-29 (×3): 2 mg via INTRAVENOUS
  Administered 2017-01-29: 1 mg via INTRAVENOUS
  Administered 2017-01-29: 2 mg via INTRAVENOUS

## 2017-01-29 MED ORDER — EPHEDRINE 5 MG/ML INJ
INTRAVENOUS | Status: AC
  Filled 2017-01-29: qty 10

## 2017-01-29 MED ORDER — LACTATED RINGERS IV SOLN
INTRAVENOUS | Status: DC | PRN
  Administered 2017-01-29 (×2): via INTRAVENOUS

## 2017-01-29 MED ORDER — POTASSIUM CHLORIDE 10 MEQ/50ML IV SOLN: 10.0000 meq | INTRAVENOUS | Status: AC

## 2017-01-29 MED ORDER — DOPAMINE-DEXTROSE 3.2-5 MG/ML-% IV SOLN
2.5000 ug/kg/min | INTRAVENOUS | Status: DC
  Administered 2017-01-29: 2.5 ug/kg/min via INTRAVENOUS

## 2017-01-29 MED ORDER — THROMBIN 5000 UNITS EX SOLR
CUTANEOUS | Status: AC
  Filled 2017-01-29: qty 5000

## 2017-01-29 MED ORDER — ACETAMINOPHEN 650 MG RE SUPP
650.0000 mg | Freq: Once | RECTAL | Status: AC
  Administered 2017-01-29: 650 mg via RECTAL

## 2017-01-29 MED ORDER — ACETAMINOPHEN 160 MG/5ML PO SOLN: 1000.0000 mg | Freq: Four times a day (QID) | ORAL | Status: AC

## 2017-01-29 MED ORDER — LACTATED RINGERS IV SOLN
500.0000 mL | Freq: Once | INTRAVENOUS | Status: AC | PRN
  Administered 2017-01-29: 500 mL via INTRAVENOUS

## 2017-01-29 MED ORDER — SODIUM CHLORIDE 0.9 % IV SOLN
0.0000 ug/min | INTRAVENOUS | Status: DC
  Administered 2017-01-30: 65 ug/min via INTRAVENOUS
  Filled 2017-01-29 (×3): qty 2

## 2017-01-29 MED ORDER — METOCLOPRAMIDE HCL 5 MG/ML IJ SOLN
10.0000 mg | Freq: Four times a day (QID) | INTRAMUSCULAR | Status: AC
  Administered 2017-01-29 – 2017-02-01 (×12): 10 mg via INTRAVENOUS
  Filled 2017-01-29 (×13): qty 2

## 2017-01-29 MED ORDER — PROTAMINE SULFATE 10 MG/ML IV SOLN
INTRAVENOUS | Status: AC
  Filled 2017-01-29: qty 10

## 2017-01-29 MED ORDER — ROCURONIUM BROMIDE 100 MG/10ML IV SOLN
INTRAVENOUS | Status: DC | PRN
  Administered 2017-01-29 (×4): 50 mg via INTRAVENOUS

## 2017-01-29 MED ORDER — PANTOPRAZOLE SODIUM 40 MG PO TBEC
40.0000 mg | DELAYED_RELEASE_TABLET | Freq: Every day | ORAL | Status: DC
  Administered 2017-01-31 – 2017-02-04 (×5): 40 mg via ORAL
  Filled 2017-01-29 (×5): qty 1

## 2017-01-29 MED ORDER — NITROGLYCERIN IN D5W 200-5 MCG/ML-% IV SOLN: 0.0000 ug/min | INTRAVENOUS | Status: DC

## 2017-01-29 MED ORDER — DEXMEDETOMIDINE HCL IN NACL 400 MCG/100ML IV SOLN
INTRAVENOUS | Status: DC | PRN
  Administered 2017-01-29: 0.7 ug/kg/h via INTRAVENOUS

## 2017-01-29 MED ORDER — DEXTROSE 5 % IV SOLN
1.5000 g | Freq: Two times a day (BID) | INTRAVENOUS | Status: AC
  Administered 2017-01-29 – 2017-01-31 (×4): 1.5 g via INTRAVENOUS
  Filled 2017-01-29 (×4): qty 1.5

## 2017-01-29 MED ORDER — METOPROLOL TARTRATE 5 MG/5ML IV SOLN: 2.5000 mg | INTRAVENOUS | Status: DC | PRN

## 2017-01-29 MED ORDER — MIDAZOLAM HCL 2 MG/2ML IJ SOLN
2.0000 mg | INTRAMUSCULAR | Status: DC | PRN
  Administered 2017-01-30: 2 mg via INTRAVENOUS
  Filled 2017-01-29: qty 2

## 2017-01-29 MED ORDER — SODIUM CHLORIDE 0.9 % IV SOLN
INTRAVENOUS | Status: DC
  Filled 2017-01-29: qty 1

## 2017-01-29 MED FILL — Lidocaine HCl IV Inj 20 MG/ML: INTRAVENOUS | Qty: 5 | Status: AC

## 2017-01-29 MED FILL — Heparin Sodium (Porcine) Inj 1000 Unit/ML: INTRAMUSCULAR | Qty: 20 | Status: AC

## 2017-01-29 MED FILL — Sodium Bicarbonate IV Soln 8.4%: INTRAVENOUS | Qty: 50 | Status: AC

## 2017-01-29 MED FILL — Electrolyte-R (PH 7.4) Solution: INTRAVENOUS | Qty: 4000 | Status: AC

## 2017-01-29 MED FILL — Mannitol IV Soln 20%: INTRAVENOUS | Qty: 500 | Status: AC

## 2017-01-29 MED FILL — Sodium Chloride IV Soln 0.9%: INTRAVENOUS | Qty: 2000 | Status: AC

## 2017-01-29 SURGICAL SUPPLY — 125 items
ADAPTER CARDIO PERF ANTE/RETRO (ADAPTER) ×4 IMPLANT
BAG DECANTER FOR FLEXI CONT (MISCELLANEOUS) ×4 IMPLANT
BANDAGE ACE 4X5 VEL STRL LF (GAUZE/BANDAGES/DRESSINGS) ×4 IMPLANT
BANDAGE ACE 6X5 VEL STRL LF (GAUZE/BANDAGES/DRESSINGS) ×4 IMPLANT
BASKET HEART  (ORDER IN 25'S) (MISCELLANEOUS) ×1
BASKET HEART (ORDER IN 25'S) (MISCELLANEOUS) ×1
BASKET HEART (ORDER IN 25S) (MISCELLANEOUS) ×2 IMPLANT
BLADE CLIPPER SURG (BLADE) ×4 IMPLANT
BLADE STERNUM SYSTEM 6 (BLADE) ×4 IMPLANT
BLADE SURG 12 STRL SS (BLADE) ×4 IMPLANT
BLADE SURG 15 STRL LF DISP TIS (BLADE) ×2 IMPLANT
BLADE SURG 15 STRL SS (BLADE) ×2
BNDG GAUZE ELAST 4 BULKY (GAUZE/BANDAGES/DRESSINGS) ×4 IMPLANT
CANISTER SUCT 3000ML PPV (MISCELLANEOUS) ×4 IMPLANT
CANNULA GUNDRY RCSP 15FR (MISCELLANEOUS) ×4 IMPLANT
CATH CPB KIT VANTRIGT (MISCELLANEOUS) ×4 IMPLANT
CATH HEART VENT LEFT (CATHETERS) ×4 IMPLANT
CATH RETROPLEGIA CORONARY 14FR (CATHETERS) IMPLANT
CATH ROBINSON RED A/P 18FR (CATHETERS) ×12 IMPLANT
CATH THORACIC 36FR RT ANG (CATHETERS) ×4 IMPLANT
CLIP FOGARTY SPRING 6M (CLIP) IMPLANT
CONT SPEC 4OZ CLIKSEAL STRL BL (MISCELLANEOUS) ×8 IMPLANT
COUNTER NEEDLE 20 DBL MAG RED (NEEDLE) ×4 IMPLANT
COVER SURGICAL LIGHT HANDLE (MISCELLANEOUS) ×12 IMPLANT
CRADLE DONUT ADULT HEAD (MISCELLANEOUS) ×4 IMPLANT
DRAIN CHANNEL 32F RND 10.7 FF (WOUND CARE) ×4 IMPLANT
DRAPE CARDIOVASCULAR INCISE (DRAPES) ×2
DRAPE SLUSH/WARMER DISC (DRAPES) ×4 IMPLANT
DRAPE SRG 135X102X78XABS (DRAPES) ×2 IMPLANT
DRSG AQUACEL AG ADV 3.5X14 (GAUZE/BANDAGES/DRESSINGS) ×4 IMPLANT
ELECT BLADE 4.0 EZ CLEAN MEGAD (MISCELLANEOUS) ×4
ELECT BLADE 6.5 EXT (BLADE) ×4 IMPLANT
ELECT CAUTERY BLADE 6.4 (BLADE) ×4 IMPLANT
ELECT REM PT RETURN 9FT ADLT (ELECTROSURGICAL) ×8
ELECTRODE BLDE 4.0 EZ CLN MEGD (MISCELLANEOUS) ×2 IMPLANT
ELECTRODE REM PT RTRN 9FT ADLT (ELECTROSURGICAL) ×4 IMPLANT
FELT TEFLON 1X6 (MISCELLANEOUS) ×8 IMPLANT
GAUZE SPONGE 4X4 12PLY STRL (GAUZE/BANDAGES/DRESSINGS) ×8 IMPLANT
GLOVE BIO SURGEON STRL SZ 6 (GLOVE) ×12 IMPLANT
GLOVE BIO SURGEON STRL SZ 6.5 (GLOVE) ×12 IMPLANT
GLOVE BIO SURGEON STRL SZ7.5 (GLOVE) ×12 IMPLANT
GLOVE BIO SURGEONS STRL SZ 6.5 (GLOVE) ×4
GLOVE BIOGEL PI IND STRL 6 (GLOVE) ×8 IMPLANT
GLOVE BIOGEL PI INDICATOR 6 (GLOVE) ×8
GLOVE SURG SS PI 6.0 STRL IVOR (GLOVE) ×8 IMPLANT
GOWN STRL REUS W/ TWL LRG LVL3 (GOWN DISPOSABLE) ×20 IMPLANT
GOWN STRL REUS W/TWL LRG LVL3 (GOWN DISPOSABLE) ×20
HEMOSTAT POWDER SURGIFOAM 1G (HEMOSTASIS) ×12 IMPLANT
HEMOSTAT SURGICEL 2X14 (HEMOSTASIS) ×4 IMPLANT
INSERT FOGARTY XLG (MISCELLANEOUS) IMPLANT
KIT BASIN OR (CUSTOM PROCEDURE TRAY) ×4 IMPLANT
KIT ROOM TURNOVER OR (KITS) ×4 IMPLANT
KIT SUCTION CATH 14FR (SUCTIONS) ×4 IMPLANT
KIT VASOVIEW HEMOPRO VH 3000 (KITS) ×4 IMPLANT
LEAD PACING MYOCARDI (MISCELLANEOUS) ×4 IMPLANT
LINE VENT (MISCELLANEOUS) ×4 IMPLANT
MARKER GRAFT CORONARY BYPASS (MISCELLANEOUS) ×12 IMPLANT
NS IRRIG 1000ML POUR BTL (IV SOLUTION) ×24 IMPLANT
PACK OPEN HEART (CUSTOM PROCEDURE TRAY) ×4 IMPLANT
PAD ARMBOARD 7.5X6 YLW CONV (MISCELLANEOUS) ×8 IMPLANT
PAD ELECT DEFIB RADIOL ZOLL (MISCELLANEOUS) ×4 IMPLANT
PENCIL BUTTON HOLSTER BLD 10FT (ELECTRODE) ×4 IMPLANT
POWDER SURGICEL 3.0 GRAM (HEMOSTASIS) ×4 IMPLANT
PUNCH AORTIC ROTATE  4.5MM 8IN (MISCELLANEOUS) ×4 IMPLANT
PUNCH AORTIC ROTATE 4.0MM (MISCELLANEOUS) IMPLANT
PUNCH AORTIC ROTATE 4.5MM 8IN (MISCELLANEOUS) IMPLANT
PUNCH AORTIC ROTATE 5MM 8IN (MISCELLANEOUS) IMPLANT
SET CARDIOPLEGIA MPS 5001102 (MISCELLANEOUS) ×4 IMPLANT
SOLUTION ANTI FOG 6CC (MISCELLANEOUS) ×4 IMPLANT
SPOGE SURGIFLO 8M (HEMOSTASIS) ×2
SPONGE LAP 18X18 X RAY DECT (DISPOSABLE) ×8 IMPLANT
SPONGE SURGIFLO 8M (HEMOSTASIS) ×2 IMPLANT
SURGIFLO W/THROMBIN 8M KIT (HEMOSTASIS) ×4 IMPLANT
SUT BONE WAX W31G (SUTURE) ×4 IMPLANT
SUT ETHIBON 2 0 V 52N 30 (SUTURE) ×16 IMPLANT
SUT ETHIBON EXCEL 2-0 V-5 (SUTURE) ×8 IMPLANT
SUT ETHIBOND 2 0 SH (SUTURE) ×10
SUT ETHIBOND 2 0 SH 36X2 (SUTURE) ×10 IMPLANT
SUT ETHIBOND V-5 VALVE (SUTURE) ×8 IMPLANT
SUT MNCRL AB 4-0 PS2 18 (SUTURE) ×4 IMPLANT
SUT PROLENE 3 0 RB 1 (SUTURE) ×8 IMPLANT
SUT PROLENE 3 0 SH 1 (SUTURE) IMPLANT
SUT PROLENE 3 0 SH DA (SUTURE) ×8 IMPLANT
SUT PROLENE 3 0 SH1 36 (SUTURE) IMPLANT
SUT PROLENE 4 0 RB 1 (SUTURE) ×14
SUT PROLENE 4 0 SH DA (SUTURE) ×4 IMPLANT
SUT PROLENE 4-0 RB1 .5 CRCL 36 (SUTURE) ×14 IMPLANT
SUT PROLENE 5 0 C 1 36 (SUTURE) ×8 IMPLANT
SUT PROLENE 6 0 C 1 30 (SUTURE) ×16 IMPLANT
SUT PROLENE 6 0 CC (SUTURE) ×12 IMPLANT
SUT PROLENE 8 0 BV175 6 (SUTURE) ×8 IMPLANT
SUT PROLENE BLUE 7 0 (SUTURE) ×4 IMPLANT
SUT SILK  1 MH (SUTURE) ×6
SUT SILK 1 MH (SUTURE) ×6 IMPLANT
SUT SILK 1 TIES 10X30 (SUTURE) ×4 IMPLANT
SUT SILK 2 0 SH CR/8 (SUTURE) ×12 IMPLANT
SUT SILK 2 0 TIES 10X30 (SUTURE) ×4 IMPLANT
SUT SILK 2 0 TIES 17X18 (SUTURE) ×2
SUT SILK 2-0 18XBRD TIE BLK (SUTURE) ×2 IMPLANT
SUT SILK 3 0 SH CR/8 (SUTURE) ×4 IMPLANT
SUT SILK 4 0 TIE 10X30 (SUTURE) ×8 IMPLANT
SUT STEEL 6MS V (SUTURE) ×8 IMPLANT
SUT STEEL SZ 6 DBL 3X14 BALL (SUTURE) ×4 IMPLANT
SUT TEM PAC WIRE 2 0 SH (SUTURE) ×8 IMPLANT
SUT VIC AB 1 CTX 36 (SUTURE) ×6
SUT VIC AB 1 CTX36XBRD ANBCTR (SUTURE) ×6 IMPLANT
SUT VIC AB 2-0 CT1 27 (SUTURE) ×4
SUT VIC AB 2-0 CT1 TAPERPNT 27 (SUTURE) ×4 IMPLANT
SUT VIC AB 2-0 CTX 27 (SUTURE) ×8 IMPLANT
SUT VIC AB 3-0 X1 27 (SUTURE) ×8 IMPLANT
SUTURE E-PAK OPEN HEART (SUTURE) IMPLANT
SYR 10ML KIT SKIN ADHESIVE (MISCELLANEOUS) IMPLANT
SYSTEM SAHARA CHEST DRAIN ATS (WOUND CARE) ×4 IMPLANT
TAPE CLOTH SURG 4X10 WHT LF (GAUZE/BANDAGES/DRESSINGS) ×4 IMPLANT
TAPE PAPER 2X10 WHT MICROPORE (GAUZE/BANDAGES/DRESSINGS) ×4 IMPLANT
TOWEL GREEN STERILE (TOWEL DISPOSABLE) ×16 IMPLANT
TOWEL GREEN STERILE FF (TOWEL DISPOSABLE) ×8 IMPLANT
TOWEL OR 17X24 6PK STRL BLUE (TOWEL DISPOSABLE) ×8 IMPLANT
TOWEL OR 17X26 10 PK STRL BLUE (TOWEL DISPOSABLE) ×8 IMPLANT
TRAY FOLEY SILVER 16FR TEMP (SET/KITS/TRAYS/PACK) ×4 IMPLANT
TUBING INSUFFLATION (TUBING) ×4 IMPLANT
UNDERPAD 30X30 (UNDERPADS AND DIAPERS) ×4 IMPLANT
VALVE MAGNA EASE AORTIC 25MM (Prosthesis & Implant Heart) ×4 IMPLANT
VENT LEFT HEART 12002 (CATHETERS) ×8
WATER STERILE IRR 1000ML POUR (IV SOLUTION) ×8 IMPLANT

## 2017-01-29 NOTE — OR Nursing (Signed)
12"30 - 45 minute call to SICU

## 2017-01-29 NOTE — Progress Notes (Signed)
CT surgery p.m. Rounds  Status post aVR CABG 2 Hemodynamic stable Neuro intact Extubated with stable x-ray Minimal chest tube output Atrially paced with underlying sinus

## 2017-01-29 NOTE — Anesthesia Procedure Notes (Addendum)
Central Venous Catheter Insertion Performed by: Arta Bruce, anesthesiologist Start/End8/21/2018 6:35 AM, 01/29/2017 6:50 AM Patient location: OR. Preanesthetic checklist: patient identified, IV checked, risks and benefits discussed, surgical consent, monitors and equipment checked, pre-op evaluation, timeout performed and anesthesia consent Position: Trendelenburg Patient sedated Hand hygiene performed , maximum sterile barriers used  and Seldinger technique used PA cath and Central line was placed.Sheath introducer Procedure performed using ultrasound guided technique. Ultrasound Notes:anatomy identified, needle tip was noted to be adjacent to the nerve/plexus identified, no ultrasound evidence of intravascular and/or intraneural injection and image(s) printed for medical record Attempts: 1 Following insertion, line sutured. Post procedure assessment: blood return through all ports, free fluid flow and no air  Patient tolerated the procedure well with no immediate complications.

## 2017-01-29 NOTE — Progress Notes (Signed)
Dr Donata Clay said it was ok to extubate patient without parameters due to patient being nauseous. Patient extubated at this time and tolerating well.

## 2017-01-29 NOTE — Anesthesia Postprocedure Evaluation (Signed)
Anesthesia Post Note  Patient: Jordan Hammond  Procedure(s) Performed: Procedure(s) (LRB): AORTIC VALVE REPLACEMENT (AVR) (N/A) CORONARY ARTERY BYPASS GRAFTING (CABG)  x two, using left internal mammary artery and right leg greater saphenous vein harvested endoscopically (N/A) TRANSESOPHAGEAL ECHOCARDIOGRAM (TEE) (N/A)     Patient location during evaluation: SICU Anesthesia Type: General Level of consciousness: sedated and patient remains intubated per anesthesia plan Pain management: pain level controlled Vital Signs Assessment: post-procedure vital signs reviewed and stable Respiratory status: patient on ventilator - see flowsheet for VS and patient remains intubated per anesthesia plan (beginning to wean from vent) Cardiovascular status: requiring Phenylephrine support. Postop Assessment: no signs of nausea or vomiting Anesthetic complications: no    Last Vitals:  Vitals:   01/29/17 1730 01/29/17 1804  BP:    Pulse: 88   Resp: 16   Temp: 36.6 C   SpO2: 100% 100%    Last Pain:  Vitals:   01/29/17 1600  TempSrc: Core (Comment)  PainSc:                  Daneya Hartgrove,E. Kyara Boxer

## 2017-01-29 NOTE — Progress Notes (Signed)
Pre Procedure note for inpatients:   Jordan Hammond has been scheduled for Procedure(s): AORTIC VALVE REPLACEMENT (AVR) (N/A) CORONARY ARTERY BYPASS GRAFTING (CABG) (N/A) TRANSESOPHAGEAL ECHOCARDIOGRAM (TEE) (N/A) today. The various methods of treatment have been discussed with the patient. After consideration of the risks, benefits and treatment options the patient has consented to the planned procedure.   The patient has been seen and labs reviewed. There are no changes in the patient's condition to prevent proceeding with the planned procedure today.  Recent labs:  Lab Results  Component Value Date   WBC 6.1 01/29/2017   HGB 14.6 01/29/2017   HCT 43.0 01/29/2017   PLT 217 01/29/2017   GLUCOSE 103 (H) 01/29/2017   CHOL 169 01/20/2017   TRIG 92 01/20/2017   HDL 48 01/20/2017   LDLCALC 103 (H) 01/20/2017   ALT 35 01/24/2017   AST 37 01/24/2017   NA 139 01/29/2017   K 4.0 01/29/2017   CL 104 01/29/2017   CREATININE 1.11 01/29/2017   BUN 24 (H) 01/29/2017   CO2 27 01/29/2017   TSH 5.620 (H) 01/24/2017   INR 0.98 01/24/2017   HGBA1C 5.3 01/24/2017    Mikey Bussing, MD 01/29/2017 7:04 AM

## 2017-01-29 NOTE — Anesthesia Procedure Notes (Signed)
Procedure Name: Intubation Date/Time: 01/29/2017 7:54 AM Performed by: Adonis Housekeeper Pre-anesthesia Checklist: Patient identified, Emergency Drugs available, Suction available and Patient being monitored Patient Re-evaluated:Patient Re-evaluated prior to induction Oxygen Delivery Method: Circle system utilized Preoxygenation: Pre-oxygenation with 100% oxygen Induction Type: IV induction Ventilation: Mask ventilation without difficulty and Oral airway inserted - appropriate to patient size Laryngoscope Size: Glidescope and 4 Grade View: Grade I Tube type: Oral Tube size: 8.0 mm Number of attempts: 1 Airway Equipment and Method: Video-laryngoscopy and Rigid stylet Placement Confirmation: ETT inserted through vocal cords under direct vision,  positive ETCO2 and breath sounds checked- equal and bilateral Secured at: 21 cm Tube secured with: Tape Dental Injury: Teeth and Oropharynx as per pre-operative assessment

## 2017-01-29 NOTE — OR Nursing (Signed)
13:30 - 20 minute call to SICU charge nurse 

## 2017-01-29 NOTE — Progress Notes (Signed)
  Echocardiogram Echocardiogram Transesophageal has been performed.  Jordan Hammond 01/29/2017, 9:57 AM

## 2017-01-29 NOTE — Anesthesia Procedure Notes (Signed)
Arterial Line Insertion Start/End8/21/2018 6:55 AM, 01/29/2017 7:05 AM Performed by: Arther Abbott, CRNA  Patient location: Pre-op. Preanesthetic checklist: patient identified, IV checked, site marked, risks and benefits discussed, surgical consent and pre-op evaluation Lidocaine 1% used for infiltration and patient sedated radial was placed Catheter size: 20 G Hand hygiene performed  and maximum sterile barriers used   Attempts: 3 (x2 by srna student. x1 by crna ) Procedure performed without using ultrasound guided technique. Ultrasound Notes:anatomy identified, needle tip was noted to be adjacent to the nerve/plexus identified and no ultrasound evidence of intravascular and/or intraneural injection Following insertion, dressing applied and Biopatch. Post procedure assessment: normal  Post procedure complications: local hematoma. Patient tolerated the procedure well with no immediate complications.

## 2017-01-29 NOTE — Transfer of Care (Signed)
Immediate Anesthesia Transfer of Care Note  Patient: Wilver Wendland  Procedure(s) Performed: Procedure(s): AORTIC VALVE REPLACEMENT (AVR) (N/A) CORONARY ARTERY BYPASS GRAFTING (CABG)  x two, using left internal mammary artery and right leg greater saphenous vein harvested endoscopically (N/A) TRANSESOPHAGEAL ECHOCARDIOGRAM (TEE) (N/A)  Patient Location: ICU  Anesthesia Type:General  Level of Consciousness: sedated and Patient remains intubated per anesthesia plan  Airway & Oxygen Therapy: Patient remains intubated per anesthesia plan and Patient placed on Ventilator (see vital sign flow sheet for setting)  Post-op Assessment: Report given to RN and Post -op Vital signs reviewed and stable BP 115/65 aline, spo2 100% paced.   Post vital signs: Reviewed and stable  Last Vitals:  Vitals:   01/28/17 1927 01/29/17 0458  BP: 129/87 118/72  Pulse: 66 73  Resp: 18 18  Temp: 36.7 C 36.6 C  SpO2: 98% 100%    Last Pain:  Vitals:   01/29/17 0458  TempSrc: Oral  PainSc:       Patients Stated Pain Goal: 0 (01/26/17 0520)  Complications: No apparent anesthesia complications

## 2017-01-29 NOTE — Brief Op Note (Signed)
01/19/2017 - 01/29/2017  12:04 PM  PATIENT:  Jordan Hammond  76 y.o. male  PRE-OPERATIVE DIAGNOSIS:  SEVERE AS CAD  POST-OPERATIVE DIAGNOSIS:  SEVERE AS CAD  PROCEDURE:  Procedure(s): AORTIC VALVE REPLACEMENT (AVR) (N/A) CORONARY ARTERY BYPASS GRAFTING (CABG)  x two, using left internal mammary artery and right leg greater saphenous vein harvested endoscopically (N/A) TRANSESOPHAGEAL ECHOCARDIOGRAM (TEE) (N/A)  SURGEON:  Surgeon(s) and Role:    Kerin Perna, MD - Primary  PHYSICIAN ASSISTANT:  Jari Favre, PA-C   ANESTHESIA:   general  EBL:  Total I/O In: 1700 [I.V.:1700] Out: 550 [Urine:550]  BLOOD ADMINISTERED:none  DRAINS: ROUTINE   LOCAL MEDICATIONS USED:  NONE  SPECIMEN:  Source of Specimen:  AORTIC LEAFLETS  DISPOSITION OF SPECIMEN:  PATHOLOGY  COUNTS:  YES  TOURNIQUET:  * No tourniquets in log *  DICTATION: .Dragon Dictation  PLAN OF CARE: Admit to inpatient   PATIENT DISPOSITION:  ICU - intubated and hemodynamically stable.   Delay start of Pharmacological VTE agent (>24hrs) due to surgical blood loss or risk of bleeding: yes

## 2017-01-29 NOTE — Procedures (Signed)
Extubation Procedure Note  Patient Details:   Name: Jordan Hammond DOB: 05/22/1941 MRN: [id number]   Airway Documentation:     Evaluation  O2 sats: stable throughout Complications: No apparent complications Patient did tolerate procedure well. Bilateral Breath Sounds: Clear, Diminished   Yes   Patient was extubated to a 4L [place] per the rapid wean protocol. Cuff leak was heard. No stridor was noted. RN at bedside with RT during extubation.   Darolyn Rua 01/29/2017, 6:59 PM

## 2017-01-30 ENCOUNTER — Inpatient Hospital Stay (HOSPITAL_COMMUNITY): Payer: Medicare HMO

## 2017-01-30 ENCOUNTER — Encounter (HOSPITAL_COMMUNITY): Payer: Self-pay | Admitting: Cardiothoracic Surgery

## 2017-01-30 LAB — BASIC METABOLIC PANEL
ANION GAP: 6 (ref 5–15)
BUN: 14 mg/dL (ref 6–20)
CALCIUM: 8.3 mg/dL — AB (ref 8.9–10.3)
CO2: 23 mmol/L (ref 22–32)
Chloride: 107 mmol/L (ref 101–111)
Creatinine, Ser: 0.98 mg/dL (ref 0.61–1.24)
Glucose, Bld: 139 mg/dL — ABNORMAL HIGH (ref 65–99)
POTASSIUM: 4.3 mmol/L (ref 3.5–5.1)
Sodium: 136 mmol/L (ref 135–145)

## 2017-01-30 LAB — POCT I-STAT 3, ART BLOOD GAS (G3+)
Acid-base deficit: 1 mmol/L (ref 0.0–2.0)
Bicarbonate: 23.3 mmol/L (ref 20.0–28.0)
O2 Saturation: 97 %
Patient temperature: 37.5
TCO2: 24 mmol/L (ref 0–100)
pCO2 arterial: 39.4 mmHg (ref 32.0–48.0)
pH, Arterial: 7.382 (ref 7.350–7.450)
pO2, Arterial: 92 mmHg (ref 83.0–108.0)

## 2017-01-30 LAB — POCT I-STAT 4, (NA,K, GLUC, HGB,HCT)
Glucose, Bld: 144 mg/dL — ABNORMAL HIGH (ref 65–99)
HCT: 27 % — ABNORMAL LOW (ref 39.0–52.0)
Hemoglobin: 9.2 g/dL — ABNORMAL LOW (ref 13.0–17.0)
Potassium: 4.2 mmol/L (ref 3.5–5.1)
Sodium: 136 mmol/L (ref 135–145)

## 2017-01-30 LAB — CBC
HCT: 27.3 % — ABNORMAL LOW (ref 39.0–52.0)
HEMATOCRIT: 28 % — AB (ref 39.0–52.0)
Hemoglobin: 9.3 g/dL — ABNORMAL LOW (ref 13.0–17.0)
Hemoglobin: 9.4 g/dL — ABNORMAL LOW (ref 13.0–17.0)
MCH: 30.2 pg (ref 26.0–34.0)
MCH: 31.1 pg (ref 26.0–34.0)
MCHC: 33.2 g/dL (ref 30.0–36.0)
MCHC: 34.4 g/dL (ref 30.0–36.0)
MCV: 90.9 fL (ref 78.0–100.0)
MCV: 90.4 fL (ref 78.0–100.0)
PLATELETS: 96 10*3/uL — AB (ref 150–400)
Platelets: 91 10*3/uL — ABNORMAL LOW (ref 150–400)
RBC: 3.08 MIL/uL — AB (ref 4.22–5.81)
RBC: 3.02 MIL/uL — AB (ref 4.22–5.81)
RDW: 13.3 % (ref 11.5–15.5)
RDW: 12.9 % (ref 11.5–15.5)
WBC: 6.9 10*3/uL (ref 4.0–10.5)
WBC: 8.7 10*3/uL (ref 4.0–10.5)

## 2017-01-30 LAB — CREATININE, SERUM
CREATININE: 0.86 mg/dL (ref 0.61–1.24)
GFR calc Af Amer: >60 mL/min (ref >60)

## 2017-01-30 LAB — GLUCOSE, CAPILLARY
Glucose-Capillary: 136 mg/dL — ABNORMAL HIGH (ref 65–99)
Glucose-Capillary: 145 mg/dL — ABNORMAL HIGH (ref 65–99)
Glucose-Capillary: 147 mg/dL — ABNORMAL HIGH (ref 65–99)
Glucose-Capillary: 142 mg/dL — ABNORMAL HIGH (ref 65–99)
Glucose-Capillary: 144 mg/dL — ABNORMAL HIGH (ref 65–99)
Glucose-Capillary: 116 mg/dL — ABNORMAL HIGH (ref 65–99)

## 2017-01-30 LAB — MAGNESIUM
MAGNESIUM: 1.9 mg/dL (ref 1.7–2.4)
Magnesium: 2.4 mg/dL (ref 1.7–2.4)

## 2017-01-30 MED ORDER — LIDOCAINE HCL (PF) 1 % IJ SOLN
INTRAMUSCULAR | Status: AC
  Filled 2017-01-30: qty 30

## 2017-01-30 MED ORDER — ALBUMIN HUMAN 5 % IV SOLN
12.5000 g | Freq: Once | INTRAVENOUS | Status: AC
  Administered 2017-01-30: 12.5 g via INTRAVENOUS

## 2017-01-30 MED ORDER — MIDAZOLAM HCL 2 MG/2ML IJ SOLN
1.0000 mg | Freq: Four times a day (QID) | INTRAMUSCULAR | Status: DC | PRN
Start: 2017-01-30 — End: 2017-02-01

## 2017-01-30 MED ORDER — NOREPINEPHRINE BITARTRATE 1 MG/ML IV SOLN
0.0000 ug/min | INTRAVENOUS | Status: DC
  Administered 2017-01-30: 17 ug/min via INTRAVENOUS
  Administered 2017-01-30: 4 ug/min via INTRAVENOUS
  Administered 2017-01-31: 12 ug/min via INTRAVENOUS
  Filled 2017-01-30 (×3): qty 16

## 2017-01-30 MED ORDER — FENTANYL CITRATE (PF) 100 MCG/2ML IJ SOLN
INTRAMUSCULAR | Status: AC
  Filled 2017-01-30: qty 2

## 2017-01-30 MED ORDER — INSULIN ASPART 100 UNIT/ML ~~LOC~~ SOLN: 0.0000 [IU] | SUBCUTANEOUS | Status: DC

## 2017-01-30 MED ORDER — MORPHINE SULFATE (PF) 4 MG/ML IV SOLN
INTRAVENOUS | Status: AC
  Filled 2017-01-30: qty 1

## 2017-01-30 MED ORDER — VANCOMYCIN HCL IN DEXTROSE 1-5 GM/200ML-% IV SOLN
1000.0000 mg | Freq: Once | INTRAVENOUS | Status: AC
  Administered 2017-01-30: 1000 mg via INTRAVENOUS
  Filled 2017-01-30: qty 200

## 2017-01-30 MED ORDER — FENTANYL CITRATE (PF) 100 MCG/2ML IJ SOLN
50.0000 ug | Freq: Once | INTRAMUSCULAR | Status: AC
  Administered 2017-01-30: 50 ug via INTRAVENOUS

## 2017-01-30 MED ORDER — LIDOCAINE HCL (PF) 1 % IJ SOLN
30.0000 mL | Freq: Once | INTRAMUSCULAR | Status: AC
  Administered 2017-01-30: 15 mL via INTRADERMAL

## 2017-01-30 MED ORDER — PHENYLEPHRINE HCL 10 MG/ML IJ SOLN
0.0000 ug/min | INTRAMUSCULAR | Status: DC
  Administered 2017-01-30: 5 ug/min via INTRAVENOUS
  Filled 2017-01-30: qty 2

## 2017-01-30 MED ORDER — CHLORHEXIDINE GLUCONATE 0.12% ORAL RINSE (MEDLINE KIT)
15.0000 mL | Freq: Two times a day (BID) | OROMUCOSAL | Status: DC
  Administered 2017-01-31 – 2017-02-02 (×4): 15 mL via OROMUCOSAL

## 2017-01-30 MED ORDER — INSULIN ASPART 100 UNIT/ML ~~LOC~~ SOLN
0.0000 [IU] | SUBCUTANEOUS | Status: DC
  Administered 2017-01-30 – 2017-01-31 (×6): 2 [IU] via SUBCUTANEOUS

## 2017-01-30 NOTE — Progress Notes (Signed)
Chest Tube insertion by Dr. Donata Clay. Pt tolerated procedure well. V/S were stable throughout procedure. Will continue monitor. Chest X-ray ordered and radiology notified.

## 2017-01-30 NOTE — Op Note (Signed)
NAMESWAY, HOGLUND                ACCOUNT NO.:  [id number]  MEDICAL RECORD NO.:  [id number]  LOCATION:  [facility]                         FACILITY:  [facility]  PHYSICIAN:  Kerin Perna, M.D.  DATE OF BIRTH:  [birth year]  DATE OF PROCEDURE:  01/29/2017 DATE OF DISCHARGE:                              OPERATIVE REPORT   OPERATIONS: 1. Coronary artery bypass grafting x2 (left internal mammary artery to     left anterior descending, saphenous vein graft to obtuse marginal). 2. 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  PREOPERATIVE DIAGNOSES:  Unstable angina, non-ST elevation myocardial infarction, severe 2 vessel coronary artery disease, moderate aortic stenosis.  POSTOPERATIVE DIAGNOSES:  Unstable angina, non-ST elevation myocardial infarction, severe 2-vessel coronary artery disease, moderate aortic stenosis.  ANESTHESIA:  General by Germaine Pomfret, M.D.  CLINICAL NOTE:  The patient is a 76 year old gentleman who was admitted to the hospital with symptoms of accelerating - unstable angina, was found to have positive cardiac enzymes.  Cardiac catheterization demonstrated high-grade 90% LAD and circumflex stenoses.  He had a systolic murmur.  Echocardiogram demonstrated moderate aortic stenosis with a gradient of 38 mmHg and a calculated valve area of 0.8 cm2.  He was felt to be a candidate for CABG with possible aortic valve replacement.  After reviewing the patient's coronary arteriogram and echocardiogram, I discussed the situation with the patient after examining the patient.  I agreed with the Cardiology recommendation for CABG with combined AVR.  I discussed the procedure in detail with the patient and his family including the indications, benefits, alternatives, and risks.  He understood that the surgery would improve his long-term survival, preserve his LV function, and improve quality of life.  He understood he would  encounter a risk with the operation including the risks of stroke, bleeding, blood transfusion, infection, postoperative pulmonary problems including pleural effusion, postoperative arrhythmia potentially requiring pacemaker placement, and death.  After reviewing these issues, he demonstrated his understanding and agreed to proceed with surgery under what I felt was an informed consent.  OPERATIVE FINDINGS: 1. Adequate conduit, left internal mammary artery, and right leg     endoscopically harvested vein. 2. Adequate coronary targets. 3. Moderate aortic stenosis with a thickened, heavily calcified aortic     valve, replaced with a 25-mm pericardial tissue valve.  DESCRIPTION OF PROCEDURE:  The patient was brought to the operating room, placed supine on the operating table.  General anesthesia was induced under invasive hemodynamic monitoring.  The chest, abdomen, and legs were prepped with Betadine and draped as a sterile field.  A proper time-out was performed.  A transesophageal echo probe was placed by the anesthesiologist, which confirmed the preoperative diagnosis.  A sternal incision was made as the saphenous vein was harvested endoscopically from the right leg.  The left internal mammary artery was harvested as a pedicle graft from its origin at the subclavian vessels. The sternal retractor was placed and the pericardium opened and suspended.  Pursestrings were placed in the ascending aorta and right atrium.  After heparin was administered, the ACT was documented as being therapeutic, and the patient  was cannulated and placed on cardiopulmonary bypass.  The coronaries were identified for grafting. The mammary artery and vein grafts were prepared for the distal anastomoses.  Cardioplegia cannulas were placed in both antegrade and retrograde cold blood cardioplegia.  The patient was cooled to 32 degrees.  Aortic crossclamp was applied.  One liter of cold blood cardioplegia was  delivered in split doses between the antegrade aortic and retrograde coronary sinus catheters.  There was good cardioplegic arrest and septal temperature dropped less than 14 degrees. Cardioplegia was delivered every 20 minutes.  The distal coronary anastomoses were performed.  The first distal anastomosis was to the OM branch of the left coronary.  The vessel was 1.5-mm vessel with proximal 90% stenosis.  A reverse saphenous vein was sewn end-to-side with running 7-0 Prolene with good flow through the graft.  Cardioplegia was redosed.  The second distal anastomosis was to the distal third of the LAD.  It was 1.7-mm vessel with proximal 90% stenosis.  The left IMA pedicle was brought through an opening and the left lateral pericardium was brought down onto the LAD and sewn end-to-side with running 8-0 Prolene.  There was good flow through the anastomosis.  Cardioplegia was redosed.  An aortotomy was then performed.  An aortic valve was inspected.  It was calcified, thickened, and stenotic.  The leaflets were excised.  The annulus was debrided of calcium.  The outflow tract was irrigated with copious amounts of cold saline.  The annulus was sized to a 25 mm valve. Subannular 2-0 pledgeted Ethibond sutures were placed around the annulus.  The valve was prepared according to the protocol.  The sutures were placed through the sewing ring and the valve was seated and sutures were all tied.  The valve conformed to the annulus well and there were no spaces for perivalvular leak.  The coronary ostia were widely patent and unobstructed.  The aortotomy was closed with 2 layers of running 4-0 Prolene.  With the crossclamp still in place, the proximal vein anastomosis was performed with a 4.5-mm punch and running 6-0 Prolene.  Prior to tying down the proximal anastomosis, air was vented from the coronaries with a dose of retrograde warm blood cardioplegia and the crossclamp was removed.  The  heart was cardioverted back to a regular rhythm.  The vein graft was de-aired and opened.  There was good flow through the mammary artery and vein grafting.  Hemostasis was documented at the proximal distal anastomoses.  The aortotomy was hemostatic.  Temporary pacing wires were applied.  The patient was rewarmed and reperfused.  When the patient was actively rewarmed and reperfused, the lungs were expanded.  The ventilator was resumed.  The patient was weaned from cardiopulmonary bypass without difficulty without needing inotropes.  Echo showed normal functioning aortic valve and good LV systolic function.  Protamine was administered without adverse reaction.  The cannulas were removed.  The mediastinal fat was closed with interrupted silk sutures.  Anterior mediastinal and left pleural chest tubes were placed and brought out through separate incisions.  The patient remained stable.  The sternum was closed with interrupted steel wire. The pectoralis fascia was closed with a running #1 Vicryl.  The subcutaneous and skin layers were closed in running Vicryl and sterile dressings were applied.  Total cardiopulmonary bypass time was 110 minutes.     Kerin Perna, M.D.     PV/MEDQ  D:  01/29/2017  T:  01/30/2017  Job:  [id number]  cc:  Dr. Viann Fish

## 2017-01-30 NOTE — Progress Notes (Signed)
1 Day Post-Op Procedure(s) (LRB): AORTIC VALVE REPLACEMENT (AVR) (N/A) CORONARY ARTERY BYPASS GRAFTING (CABG)  x two, using left internal mammary artery and right leg greater saphenous vein harvested endoscopically (N/A) TRANSESOPHAGEAL ECHOCARDIOGRAM (TEE) (N/A) Subjective: On neo, norepinephrine for BP support Extubated, neuro intact Am CXR -- 30% R PNTX. 20 F chest tube placed at bedside with local lidocaine, iv fentanyl. Air expressed Objective: Vital signs in last 24 hours: Temp:  [96.1 F (35.6 C)-100.4 F (38 C)] 98.8 F (37.1 C) (08/22 0900) Pulse Rate:  [70-89] 89 (08/22 0900) Cardiac Rhythm: Atrial paced (08/22 0900) Resp:  [11-24] 17 (08/22 0900) BP: (101-127)/(51-75) 127/56 (08/22 0900) SpO2:  [97 %-100 %] 99 % (08/22 0900) Arterial Line BP: (86-138)/(34-68) 134/52 (08/22 0830) FiO2 (%):  [40 %-50 %] 40 % (08/21 1825) Weight:  [181 lb 8 oz (82.3 kg)] 181 lb 8 oz (82.3 kg) (08/22 0447)  Hemodynamic parameters for last 24 hours: PAP: (25-64)/(11-47) 40/21 CO:  [3.5 L/min-7.8 L/min] 6.7 L/min CI:  [1.7 L/min/m2-3.9 L/min/m2] 3.3 L/min/m2  Intake/Output from previous day: 08/21 0701 - 08/22 0700 In: 6335.3 [I.V.:4505.3; Blood:480; IV Piggyback:1350] Out: 5740 [Urine:4160; Blood:1000; Chest Tube:580] Intake/Output this shift: Total I/O In: 76.4 [I.V.:76.4] Out: 255 [Urine:225; Chest Tube:30]       Exam    General- alert and comfortable   Lungs-  Decreased BS on R, left clear without rales, wheezes   Cor- regular rate and rhythm, no murmur , gallop   Abdomen- soft, non-tender   Extremities - warm, non-tender, minimal edema   Neuro- oriented, appropriate, no focal weakness   Lab Results:  Recent Labs  01/29/17 2002 01/29/17 2008 01/30/17 0219  WBC 6.6  --  6.9  HGB 10.1* 9.2* 9.3*  HCT 29.6* 27.0* 28.0*  PLT 114*  --  91*   BMET:  Recent Labs  01/29/17 0458  01/29/17 2008 01/30/17 0219  NA 139  < > 139 136  K 4.0  < > 4.7 4.3  CL 104  < > 105  107  CO2 27  --   --  23  GLUCOSE 103*  < > 148* 139*  BUN 24*  < > 18 14  CREATININE 1.11  < > 0.90 0.98  CALCIUM 9.6  --   --  8.3*  < > = values in this interval not displayed.  PT/INR:  Recent Labs  01/29/17 1417  LABPROT 17.9*  INR 1.46   ABG    Component Value Date/Time   PHART 7.382 01/30/2017 0244   HCO3 23.3 01/30/2017 0244   TCO2 24 01/30/2017 0244   ACIDBASEDEF 1.0 01/30/2017 0244   O2SAT 97.0 01/30/2017 0244   CBG (last 3)   Recent Labs  01/29/17 1606 01/29/17 1657 01/30/17 0815  GLUCAP 108* 112* 136*    Assessment/Plan: S/P Procedure(s) (LRB): AORTIC VALVE REPLACEMENT (AVR) (N/A) CORONARY ARTERY BYPASS GRAFTING (CABG)  x two, using left internal mammary artery and right leg greater saphenous vein harvested endoscopically (N/A) TRANSESOPHAGEAL ECHOCARDIOGRAM (TEE) (N/A) wean pressors as tolerated CXR post chest tube  LOS: 9 days    Kathlee Nations Trigt III 01/30/2017

## 2017-01-30 NOTE — Progress Notes (Signed)
[facility] BRIEF SICU PROGRESS NOTE  1 Day Post-Op  S/P Procedure(s) (LRB): AORTIC VALVE REPLACEMENT (AVR) (N/A) CORONARY ARTERY BYPASS GRAFTING (CABG)  x two, using left internal mammary artery and right leg greater saphenous vein harvested endoscopically (N/A) TRANSESOPHAGEAL ECHOCARDIOGRAM (TEE) (N/A)   Stable day AAI paced w/ stable hemodynamics on dpamine and levophed drips Breathing comfortably w/ O2 sats 97% on 2 L/min UOP adequate Evening labs pending  Plan: Continue current plan  Purcell Nails, MD 01/30/2017 7:47 PM

## 2017-01-31 ENCOUNTER — Inpatient Hospital Stay (HOSPITAL_COMMUNITY): Payer: Medicare HMO

## 2017-01-31 LAB — TYPE AND SCREEN
ABO/RH(D): B POS
Antibody Screen: NEGATIVE
Unit division: 0
Unit division: 0

## 2017-01-31 LAB — BASIC METABOLIC PANEL
Anion gap: 6 (ref 5–15)
BUN: 10 mg/dL (ref 6–20)
CO2: 25 mmol/L (ref 22–32)
Calcium: 8.5 mg/dL — ABNORMAL LOW (ref 8.9–10.3)
Chloride: 101 mmol/L (ref 101–111)
Creatinine, Ser: 0.85 mg/dL (ref 0.61–1.24)
GFR calc Af Amer: >60 mL/min (ref >60)
GFR calc non Af Amer: >60 mL/min (ref >60)
Glucose, Bld: 120 mg/dL — ABNORMAL HIGH (ref 65–99)
Potassium: 3.7 mmol/L (ref 3.5–5.1)
Sodium: 132 mmol/L — ABNORMAL LOW (ref 135–145)

## 2017-01-31 LAB — POCT I-STAT, CHEM 8
BUN: 15 mg/dL (ref 6–20)
Calcium, Ion: 1.2 mmol/L (ref 1.15–1.40)
Chloride: 98 mmol/L — ABNORMAL LOW (ref 101–111)
Creatinine, Ser: 0.9 mg/dL (ref 0.61–1.24)
Glucose, Bld: 126 mg/dL — ABNORMAL HIGH (ref 65–99)
HCT: 26 % — ABNORMAL LOW (ref 39.0–52.0)
Hemoglobin: 8.8 g/dL — ABNORMAL LOW (ref 13.0–17.0)
Potassium: 4.2 mmol/L (ref 3.5–5.1)
Sodium: 135 mmol/L (ref 135–145)
TCO2: 27 mmol/L (ref 0–100)

## 2017-01-31 LAB — BPAM RBC
Blood Product Expiration Date: 201809142359
Blood Product Expiration Date: 201809142359
Unit Type and Rh: 7300
Unit Type and Rh: 7300

## 2017-01-31 LAB — CBC
HCT: 28.2 % — ABNORMAL LOW (ref 39.0–52.0)
Hemoglobin: 9.7 g/dL — ABNORMAL LOW (ref 13.0–17.0)
MCH: 31 pg (ref 26.0–34.0)
MCHC: 34.4 g/dL (ref 30.0–36.0)
MCV: 90.1 fL (ref 78.0–100.0)
Platelets: 90 10*3/uL — ABNORMAL LOW (ref 150–400)
RBC: 3.13 MIL/uL — ABNORMAL LOW (ref 4.22–5.81)
RDW: 13.1 % (ref 11.5–15.5)
WBC: 7.9 10*3/uL (ref 4.0–10.5)

## 2017-01-31 LAB — GLUCOSE, CAPILLARY
Glucose-Capillary: 104 mg/dL — ABNORMAL HIGH (ref 65–99)
Glucose-Capillary: 105 mg/dL — ABNORMAL HIGH (ref 65–99)
Glucose-Capillary: 110 mg/dL — ABNORMAL HIGH (ref 65–99)
Glucose-Capillary: 119 mg/dL — ABNORMAL HIGH (ref 65–99)
Glucose-Capillary: 132 mg/dL — ABNORMAL HIGH (ref 65–99)
Glucose-Capillary: 80 mg/dL (ref 65–99)

## 2017-01-31 MED ORDER — POTASSIUM CHLORIDE 20 MEQ/15ML (10%) PO SOLN
20.0000 meq | Freq: Two times a day (BID) | ORAL | Status: DC
  Administered 2017-01-31 (×2): 20 meq via ORAL
  Filled 2017-01-31 (×2): qty 15

## 2017-01-31 MED ORDER — DOPAMINE-DEXTROSE 3.2-5 MG/ML-% IV SOLN: 1.5000 ug/kg/min | INTRAVENOUS | Status: DC

## 2017-01-31 MED ORDER — POTASSIUM CHLORIDE 10 MEQ/50ML IV SOLN
10.0000 meq | INTRAVENOUS | Status: AC
  Administered 2017-01-31 (×3): 10 meq via INTRAVENOUS
  Filled 2017-01-31 (×2): qty 50

## 2017-01-31 MED ORDER — FUROSEMIDE 20 MG PO TABS
20.0000 mg | ORAL_TABLET | Freq: Two times a day (BID) | ORAL | Status: DC
  Administered 2017-01-31 (×2): 20 mg via ORAL
  Filled 2017-01-31 (×2): qty 1

## 2017-01-31 MED ORDER — GUAIFENESIN ER 600 MG PO TB12
600.0000 mg | ORAL_TABLET | Freq: Two times a day (BID) | ORAL | Status: DC
  Administered 2017-02-01 – 2017-02-04 (×7): 600 mg via ORAL
  Filled 2017-01-31 (×7): qty 1

## 2017-01-31 MED ORDER — POTASSIUM CHLORIDE CRYS ER 20 MEQ PO TBCR
20.0000 meq | EXTENDED_RELEASE_TABLET | Freq: Two times a day (BID) | ORAL | Status: DC
  Filled 2017-01-31: qty 1

## 2017-01-31 MED ORDER — POTASSIUM CHLORIDE 10 MEQ/50ML IV SOLN
INTRAVENOUS | Status: AC
  Filled 2017-01-31: qty 50

## 2017-01-31 MED FILL — Heparin Sodium (Porcine) Inj 1000 Unit/ML: INTRAMUSCULAR | Qty: 30 | Status: AC

## 2017-01-31 MED FILL — Magnesium Sulfate Inj 50%: INTRAMUSCULAR | Qty: 10 | Status: AC

## 2017-01-31 MED FILL — Potassium Chloride Inj 2 mEq/ML: INTRAVENOUS | Qty: 40 | Status: AC

## 2017-01-31 NOTE — Care Management Note (Addendum)
Case Management Note  Patient Details  Name: Millan Otts MRN: [id number] Date of Birth: 11/20/1940  Subjective/Objective:  From home with wife, pta indep, daughter , Marylene Land, will be assisting after discharge,  POD 2 CABG, AVR, extubated 8/21, cxr with 30% ptx, additional chest tube placed, conts on neo, norepi, dopamine. Has PCP and medication coverage.                  Action/Plan: NCM will follow for dc needs.   Expected Discharge Date:  01/22/17               Expected Discharge Plan:     In-House Referral:     Discharge planning Services  CM Consult  Post Acute Care Choice:    Choice offered to:     DME Arranged:    DME Agency:     HH Arranged:    HH Agency:     Status of Service:  In process, will continue to follow  If discussed at Long Length of Stay Meetings, dates discussed:    Additional Comments:  Leone Haven, RN 01/31/2017, 4:56 PM

## 2017-01-31 NOTE — Progress Notes (Signed)
2 Days Post-Op Procedure(s) (LRB): AORTIC VALVE REPLACEMENT (AVR) (N/A) CORONARY ARTERY BYPASS GRAFTING (CABG)  x two, using left internal mammary artery and right leg greater saphenous vein harvested endoscopically (N/A) TRANSESOPHAGEAL ECHOCARDIOGRAM (TEE) (N/A) Subjective: Progressing BP better - weaning drips nsr R pntx improved with chest tube Objective: Vital signs in last 24 hours: Temp:  [98.6 F (37 C)-100.2 F (37.9 C)] 99.3 F (37.4 C) (08/23 0700) Pulse Rate:  [88-90] 89 (08/23 0700) Cardiac Rhythm: Atrial paced (08/23 0400) Resp:  [9-25] 10 (08/23 0700) BP: (110-136)/(55-64) 118/64 (08/23 0700) SpO2:  [96 %-100 %] 97 % (08/23 0700) Arterial Line BP: (86-158)/(46-98) 127/55 (08/23 0700) Weight:  [190 lb 8 oz (86.4 kg)] 190 lb 8 oz (86.4 kg) (08/23 0500)  Hemodynamic parameters for last 24 hours: PAP: (23-42)/(8-21) 35/11 CO:  [5.9 L/min-6.8 L/min] 5.9 L/min CI:  [2.9 L/min/m2-3.4 L/min/m2] 2.9 L/min/m2  Intake/Output from previous day: 08/22 0701 - 08/23 0700 In: 1465 [I.V.:1265; IV Piggyback:200] Out: 0233 [Urine:2875; Chest Tube:760] Intake/Output this shift: No intake/output data recorded.       Exam    General- alert and comfortable   Lungs- clear without rales, wheezes   Cor- regular rate and rhythm, no murmur , gallop   Abdomen- soft, non-tender   Extremities - warm, non-tender, minimal edema   Neuro- oriented, appropriate, no focal weakness   Lab Results:  Recent Labs  01/30/17 1948 01/31/17 0320  WBC 8.7 7.9  HGB 9.4* 9.7*  HCT 27.3* 28.2*  PLT 96* 90*   BMET:  Recent Labs  01/30/17 0219 01/30/17 1615 01/30/17 1948 01/31/17 0320  NA 136 136  --  132*  K 4.3 4.2  --  3.7  CL 107  --   --  101  CO2 23  --   --  25  GLUCOSE 139* 144*  --  120*  BUN 14  --   --  10  CREATININE 0.98  --  0.86 0.85  CALCIUM 8.3*  --   --  8.5*    PT/INR:  Recent Labs  01/29/17 1417  LABPROT 17.9*  INR 1.46   ABG    Component Value  Date/Time   PHART 7.382 01/30/2017 0244   HCO3 23.3 01/30/2017 0244   TCO2 24 01/30/2017 0244   ACIDBASEDEF 1.0 01/30/2017 0244   O2SAT 97.0 01/30/2017 0244   CBG (last 3)   Recent Labs  01/30/17 2325 01/31/17 0315 01/31/17 0742  GLUCAP 116* 104* 119*    Assessment/Plan: S/P Procedure(s) (LRB): AORTIC VALVE REPLACEMENT (AVR) (N/A) CORONARY ARTERY BYPASS GRAFTING (CABG)  x two, using left internal mammary artery and right leg greater saphenous vein harvested endoscopically (N/A) TRANSESOPHAGEAL ECHOCARDIOGRAM (TEE) (N/A) Mobilize Diuresis d/c tubes/lines wean drips   LOS: 10 days    Kathlee Nations Trigt III 01/31/2017

## 2017-01-31 NOTE — Progress Notes (Signed)
CT Surg PM  Walked in hall NSR  Off drips PM labs checked, satisfactory

## 2017-02-01 ENCOUNTER — Inpatient Hospital Stay (HOSPITAL_COMMUNITY): Payer: Medicare HMO

## 2017-02-01 LAB — GLUCOSE, CAPILLARY
Glucose-Capillary: 91 mg/dL (ref 65–99)
Glucose-Capillary: 96 mg/dL (ref 65–99)
Glucose-Capillary: 109 mg/dL — ABNORMAL HIGH (ref 65–99)

## 2017-02-01 LAB — BASIC METABOLIC PANEL
Anion gap: 8 (ref 5–15)
BUN: 16 mg/dL (ref 6–20)
CO2: 27 mmol/L (ref 22–32)
Calcium: 8.6 mg/dL — ABNORMAL LOW (ref 8.9–10.3)
Chloride: 100 mmol/L — ABNORMAL LOW (ref 101–111)
Creatinine, Ser: 0.96 mg/dL (ref 0.61–1.24)
GFR calc Af Amer: >60 mL/min (ref >60)
GFR calc non Af Amer: >60 mL/min (ref >60)
Glucose, Bld: 102 mg/dL — ABNORMAL HIGH (ref 65–99)
Potassium: 3.8 mmol/L (ref 3.5–5.1)
Sodium: 135 mmol/L (ref 135–145)

## 2017-02-01 LAB — CBC
HCT: 25.3 % — ABNORMAL LOW (ref 39.0–52.0)
Hemoglobin: 8.7 g/dL — ABNORMAL LOW (ref 13.0–17.0)
MCH: 31 pg (ref 26.0–34.0)
MCHC: 34.4 g/dL (ref 30.0–36.0)
MCV: 90 fL (ref 78.0–100.0)
Platelets: 82 10*3/uL — ABNORMAL LOW (ref 150–400)
RBC: 2.81 MIL/uL — ABNORMAL LOW (ref 4.22–5.81)
RDW: 12.9 % (ref 11.5–15.5)
WBC: 5.9 10*3/uL (ref 4.0–10.5)

## 2017-02-01 MED ORDER — POTASSIUM CHLORIDE 20 MEQ/15ML (10%) PO SOLN
20.0000 meq | Freq: Every day | ORAL | Status: DC
  Administered 2017-02-01 – 2017-02-02 (×2): 20 meq via ORAL
  Filled 2017-02-01 (×2): qty 15

## 2017-02-01 MED ORDER — FUROSEMIDE 20 MG PO TABS: 20.0000 mg | ORAL_TABLET | Freq: Every day | ORAL | Status: DC

## 2017-02-01 MED ORDER — SODIUM CHLORIDE 0.9% FLUSH
3.0000 mL | Freq: Two times a day (BID) | INTRAVENOUS | Status: DC
  Administered 2017-02-01 – 2017-02-03 (×5): 3 mL via INTRAVENOUS

## 2017-02-01 MED ORDER — SODIUM CHLORIDE 0.9 % IV SOLN: 250.0000 mL | INTRAVENOUS | Status: DC | PRN

## 2017-02-01 MED ORDER — MOVING RIGHT ALONG BOOK
Freq: Once | Status: AC
  Administered 2017-02-01: 08:00:00
  Filled 2017-02-01: qty 1

## 2017-02-01 MED ORDER — SODIUM CHLORIDE 0.9% FLUSH: 3.0000 mL | INTRAVENOUS | Status: DC | PRN

## 2017-02-01 MED ORDER — FE FUMARATE-B12-VIT C-FA-IFC PO CAPS
1.0000 | ORAL_CAPSULE | Freq: Two times a day (BID) | ORAL | Status: DC
  Administered 2017-02-01 – 2017-02-04 (×7): 1 via ORAL
  Filled 2017-02-01 (×7): qty 1

## 2017-02-01 MED ORDER — FUROSEMIDE 40 MG PO TABS
40.0000 mg | ORAL_TABLET | Freq: Every day | ORAL | Status: DC
  Administered 2017-02-01 – 2017-02-04 (×4): 40 mg via ORAL
  Filled 2017-02-01 (×4): qty 1

## 2017-02-01 NOTE — Progress Notes (Signed)
3 Days Post-Op Procedure(s) (LRB): AORTIC VALVE REPLACEMENT (AVR) (N/A) CORONARY ARTERY BYPASS GRAFTING (CABG)  x two, using left internal mammary artery and right leg greater saphenous vein harvested endoscopically (N/A) TRANSESOPHAGEAL ECHOCARDIOGRAM (TEE) (N/A) Subjective: OOB /hungry for breakfast nsr No air leak from chest tube Objective: Vital signs in last 24 hours: Temp:  [85.3 F (29.6 C)-99.9 F (37.7 C)] 98.7 F (37.1 C) (08/24 0809) Pulse Rate:  [83-97] 87 (08/24 0705) Cardiac Rhythm: Normal sinus rhythm (08/24 0600) Resp:  [13-24] 16 (08/24 0705) BP: (71-120)/(40-93) 98/52 (08/24 0705) SpO2:  [94 %-100 %] 100 % (08/24 0705) Weight:  [178 lb 12.7 oz (81.1 kg)] 178 lb 12.7 oz (81.1 kg) (08/24 0500)  Hemodynamic parameters for last 24 hours: PAP: (33-39)/(6-15) 33/6  Intake/Output from previous day: 08/23 0701 - 08/24 0700 In: 423.7 [P.O.:380; I.V.:43.7] Out: 2045 [Urine:1825; Chest Tube:220] Intake/Output this shift: No intake/output data recorded.       Exam    General- alert and comfortable   Lungs- clear without rales, wheezes   Cor- regular rate and rhythm, no murmur , gallop   Abdomen- soft, non-tender   Extremities - warm, non-tender, minimal edema   Neuro- oriented, appropriate, no focal weakness   Lab Results:  Recent Labs  01/31/17 0320 01/31/17 1731 02/01/17 0410  WBC 7.9  --  5.9  HGB 9.7* 8.8* 8.7*  HCT 28.2* 26.0* 25.3*  PLT 90*  --  82*   BMET:  Recent Labs  01/31/17 0320 01/31/17 1731 02/01/17 0410  NA 132* 135 135  K 3.7 4.2 3.8  CL 101 98* 100*  CO2 25  --  27  GLUCOSE 120* 126* 102*  BUN 10 15 16   CREATININE 0.85 0.90 0.96  CALCIUM 8.5*  --  8.6*    PT/INR:  Recent Labs  01/29/17 1417  LABPROT 17.9*  INR 1.46   ABG    Component Value Date/Time   PHART 7.382 01/30/2017 0244   HCO3 23.3 01/30/2017 0244   TCO2 27 01/31/2017 1731   ACIDBASEDEF 1.0 01/30/2017 0244   O2SAT 97.0 01/30/2017 0244   CBG (last 3)    Recent Labs  01/31/17 2351 02/01/17 0357 02/01/17 0806  GLUCAP 80 91 96    Assessment/Plan: S/P Procedure(s) (LRB): AORTIC VALVE REPLACEMENT (AVR) (N/A) CORONARY ARTERY BYPASS GRAFTING (CABG)  x two, using left internal mammary artery and right leg greater saphenous vein harvested endoscopically (N/A) TRANSESOPHAGEAL ECHOCARDIOGRAM (TEE) (N/A) Mobilize Diuresis d/c tubes/lines Plan for transfer to step-down: see transfer orders no coumadin  Follow plt count [82k]   LOS: 11 days    Kathlee Nations Trigt III 02/01/2017

## 2017-02-01 NOTE — Progress Notes (Signed)
CARDIAC REHAB PHASE I   PRE:  Rate/Rhythm: 79 SR    BP: sitting 86/56    SaO2: 97 2L, 97 RA  MODE:  Ambulation: 370 ft   POST:  Rate/Rhythm: 99 SR    BP: sitting 98/53     SaO2: 95 RA  Pt with low BP in bed however asx. Able to d/c O2 and ambulate with RW. Fairly steady. C/o slight blurred vision at halfway. BP improved upon return to room and SaO2 95 RA. Left pt off O2 in bed. Son with pt. Encouraged x1 more walk today.  [id number]   Harriet Masson CES, ACSM 02/01/2017 3:05 PM

## 2017-02-01 NOTE — Progress Notes (Signed)
Patient arrived on 4E on a wheelchair, assessment completed see flowsheet, placed on tele ccmd notified, bed in lowest position , call bell within reach will continue to monitor.

## 2017-02-02 ENCOUNTER — Inpatient Hospital Stay (HOSPITAL_COMMUNITY): Payer: Medicare HMO

## 2017-02-02 LAB — BASIC METABOLIC PANEL
Anion gap: 8 (ref 5–15)
BUN: 21 mg/dL — ABNORMAL HIGH (ref 6–20)
CO2: 27 mmol/L (ref 22–32)
Calcium: 8.4 mg/dL — ABNORMAL LOW (ref 8.9–10.3)
Chloride: 100 mmol/L — ABNORMAL LOW (ref 101–111)
Creatinine, Ser: 0.96 mg/dL (ref 0.61–1.24)
GFR calc Af Amer: >60 mL/min (ref >60)
GFR calc non Af Amer: >60 mL/min (ref >60)
Glucose, Bld: 110 mg/dL — ABNORMAL HIGH (ref 65–99)
Potassium: 3.5 mmol/L (ref 3.5–5.1)
Sodium: 135 mmol/L (ref 135–145)

## 2017-02-02 LAB — CBC
HCT: 26.3 % — ABNORMAL LOW (ref 39.0–52.0)
Hemoglobin: 8.9 g/dL — ABNORMAL LOW (ref 13.0–17.0)
MCH: 30.5 pg (ref 26.0–34.0)
MCHC: 33.8 g/dL (ref 30.0–36.0)
MCV: 90.1 fL (ref 78.0–100.0)
Platelets: 123 10*3/uL — ABNORMAL LOW (ref 150–400)
RBC: 2.92 MIL/uL — ABNORMAL LOW (ref 4.22–5.81)
RDW: 13.2 % (ref 11.5–15.5)
WBC: 5.8 10*3/uL (ref 4.0–10.5)

## 2017-02-02 MED ORDER — METOPROLOL TARTRATE 25 MG PO TABS
25.0000 mg | ORAL_TABLET | Freq: Two times a day (BID) | ORAL | Status: DC
  Administered 2017-02-02 – 2017-02-04 (×4): 25 mg via ORAL
  Filled 2017-02-02 (×4): qty 1

## 2017-02-02 MED ORDER — POTASSIUM CHLORIDE 20 MEQ/15ML (10%) PO SOLN
40.0000 meq | Freq: Every day | ORAL | Status: DC
  Administered 2017-02-03: 40 meq via ORAL
  Filled 2017-02-02: qty 30

## 2017-02-02 NOTE — Progress Notes (Addendum)
    [address]       [place] [address]             [phone]      4 Days Post-Op Procedure(s) (LRB): AORTIC VALVE REPLACEMENT (AVR) (N/A) CORONARY ARTERY BYPASS GRAFTING (CABG)  x two, using left internal mammary artery and right leg greater saphenous vein harvested endoscopically (N/A) TRANSESOPHAGEAL ECHOCARDIOGRAM (TEE) (N/A) Subjective: Feels very well , no specific concerns- some sternal incis pain especially with cough  Objective: Vital signs in last 24 hours: Temp:  [97.5 F (36.4 C)-99.7 F (37.6 C)] 99.7 F (37.6 C) (08/25 0808) Pulse Rate:  [81-111] 81 (08/25 0808) Cardiac Rhythm: Normal sinus rhythm (08/25 0701) Resp:  [15-22] 20 (08/25 0808) BP: (98-116)/(53-74) 98/60 (08/25 0808) SpO2:  [89 %-100 %] 94 % (08/25 0808) Weight:  [168 lb 3.4 oz (76.3 kg)] 168 lb 3.4 oz (76.3 kg) (08/25 0425)  Hemodynamic parameters for last 24 hours:    Intake/Output from previous day: 08/24 0701 - 08/25 0700 In: 240 [P.O.:240] Out: -  Intake/Output this shift: No intake/output data recorded.  General appearance: alert, cooperative and no distress Heart: regular rate and rhythm and tachy Lungs: clear to auscultation bilaterally Abdomen: benign Extremities: min edema Wound: incis healing well  Lab Results:  Recent Labs  02/01/17 0410 02/02/17 0322  WBC 5.9 5.8  HGB 8.7* 8.9*  HCT 25.3* 26.3*  PLT 82* 123*   BMET:  Recent Labs  02/01/17 0410 02/02/17 0322  NA 135 135  K 3.8 3.5  CL 100* 100*  CO2 27 27  GLUCOSE 102* 110*  BUN 16 21*  CREATININE 0.96 0.96  CALCIUM 8.6* 8.4*    PT/INR: No results for input(s): LABPROT, INR in the last 72 hours. ABG    Component Value Date/Time   PHART 7.382 01/30/2017 0244   HCO3 23.3 01/30/2017 0244   TCO2 27 01/31/2017 1731   ACIDBASEDEF 1.0 01/30/2017 0244   O2SAT 97.0 01/30/2017 0244   CBG (last 3)   Recent Labs  02/01/17 0357 02/01/17 0806 02/01/17 1158  GLUCAP 91 96 109*     Meds Scheduled Meds: . acetaminophen  1,000 mg Oral Q6H   Or  . acetaminophen (TYLENOL) oral liquid 160 mg/5 mL  1,000 mg Per Tube Q6H  . aspirin EC  81 mg Oral Daily  . bisacodyl  10 mg Oral Daily   Or  . bisacodyl  10 mg Rectal Daily  . chlorhexidine gluconate (MEDLINE KIT)  15 mL Mouth Rinse BID  . clonazePAM  1 mg Oral QHS  . docusate sodium  200 mg Oral Daily  . ferrous fumarate-b12-vitamic C-folic acid  1 capsule Oral BID WC  . furosemide  40 mg Oral Daily  . gabapentin  300 mg Oral QHS  . guaiFENesin  600 mg Oral BID  . mouth rinse  15 mL Mouth Rinse QID  . metoCLOPramide (REGLAN) injection  10 mg Intravenous Q6H  . metoprolol tartrate  12.5 mg Oral BID   Or  . metoprolol tartrate  12.5 mg Per Tube BID  . pantoprazole  40 mg Oral Daily  . potassium chloride  20 mEq Oral Daily  . sodium chloride flush  3 mL Intravenous Q12H  . sodium chloride flush  3 mL Intravenous Q12H   Continuous Infusions: . sodium chloride 20 mL/hr at 01/30/17 1756  . sodium chloride Stopped (01/29/17 1440)  . sodium chloride 20 mL/hr at 01/30/17 1356  . sodium chloride       PRN Meds:.sodium chloride, sodium chloride, metoprolol tartrate, ondansetron (ZOFRAN) IV, oxyCODONE, sodium chloride flush, sodium chloride flush, traMADol  Xrays Dg Chest Port 1 View  Result Date: 02/01/2017 CLINICAL DATA:  Chest soreness.  Aortic valve replacement . EXAM: PORTABLE CHEST 1 VIEW COMPARISON:  01/31/2017 . FINDINGS: Interim removal of Swan-Ganz catheter and mediastinal drainage catheter. Intimal removal of left chest tube. Right IJ sheath and right chest tube in stable position . Stable moderate right-sided pneumothorax . No left-sided pneumothorax. Prior CABG and cardiac valve replacement. Heart size stable. Basilar subsegmental atelectasis again noted. Small left pleural effusion cannot be excluded. IMPRESSION: 1. Interim removal of Swan-Ganz catheter and mediastinal drainage catheter. Interim removal of left  chest tube . Right IJ sheath and right chest tube in stable position. Stable moderate right-sided pneumothorax. No left-sided pneumothorax. 2. Prior CABG and cardiac valve replacement. Heart size stable. 3. Bibasilar subsegmental atelectasis again noted. Small left pleural effusion cannot be excluded. Electronically Signed   By: Thomas  Register   On: 02/01/2017 07:50    Assessment/Plan: S/P Procedure(s) (LRB): AORTIC VALVE REPLACEMENT (AVR) (N/A) CORONARY ARTERY BYPASS GRAFTING (CABG)  x two, using left internal mammary artery and right leg greater saphenous vein harvested endoscopically (N/A) TRANSESOPHAGEAL ECHOCARDIOGRAM (TEE) (N/A)  1 excellent progress 2 hemodyn stable with sinus tach at times- increase lopressor to 25 bid 3 H/H slightly improved and stable, platelets improved 4 creat is stable, replace K+ 5 right pntx is stable but does have some increased sq air , sats ok on RA- will d/w MD 6 routine rehab/pylm toilet   LOS: 12 days    GOLD,WAYNE E 02/02/2017   I have seen and examined the patient and agree with the assessment and plan as outlined.  Small right apical PTX stable.  Continue conservative management.  Anticipate possible d/c home tomorrow or Monday  Clarence H Owen, MD 02/02/2017 11:57 AM     

## 2017-02-02 NOTE — Progress Notes (Signed)
CARDIAC REHAB PHASE I   PRE:  Rate/Rhythm: 101 ST  BP:  Supine:   Sitting: 104/89  Standing:    SaO2: 100% 2L  99%RA  MODE:  Ambulation: 470 ft   POST:  Rate/Rhythm: 113 ST  BP:  Supine:   Sitting: 125/92  Standing:    SaO2: 99%RA 1020-1050 Pt has been coughing productively. Walked 470 ft on RA with rolling walker and asst x 1 with steady gait. Tolerated well. Coughed once during walk and used heart pillow. Encouraged him to drink water, use IS and flutter valve. Left off oxygen since sats were good on RA. Encouraged two more walks today.   Luetta Nutting, RN BSN  02/02/2017 10:46 AM

## 2017-02-02 NOTE — Progress Notes (Signed)
Metoprolol 12.76m held this morning due to order parameters not met, WJadene PieriniPA, notified, will continue to monitor.

## 2017-02-02 NOTE — Progress Notes (Signed)
[id number] Went back to ed since pt possibly for discharge tomorrow. Discussed sternal precautions, IS, flutter valve, and ex ed. Wrote down how to view discharge video. Left heart healthy diet for pt to read. Wrote down how to view discharge video.  Discussed CRP 2 and will refer to [facility] program. Luetta Nutting RN BSN 02/02/2017 11:15 AM

## 2017-02-02 NOTE — Progress Notes (Signed)
Patient ambulated 243ft using a walker in the hall, ambulation well tolerated will continue to monitor.

## 2017-02-03 LAB — BASIC METABOLIC PANEL
ANION GAP: 11 (ref 5–15)
BUN: 26 mg/dL — ABNORMAL HIGH (ref 6–20)
CHLORIDE: 99 mmol/L — AB (ref 101–111)
CO2: 26 mmol/L (ref 22–32)
Calcium: 8.9 mg/dL (ref 8.9–10.3)
Creatinine, Ser: 1.08 mg/dL (ref 0.61–1.24)
Glucose, Bld: 141 mg/dL — ABNORMAL HIGH (ref 65–99)
POTASSIUM: 3.1 mmol/L — AB (ref 3.5–5.1)
SODIUM: 136 mmol/L (ref 135–145)

## 2017-02-03 MED ORDER — POTASSIUM CHLORIDE CRYS ER 20 MEQ PO TBCR
40.0000 meq | EXTENDED_RELEASE_TABLET | Freq: Two times a day (BID) | ORAL | Status: AC
  Administered 2017-02-03 (×2): 40 meq via ORAL
  Filled 2017-02-03 (×2): qty 2

## 2017-02-03 MED ORDER — ATORVASTATIN CALCIUM 20 MG PO TABS
20.0000 mg | ORAL_TABLET | Freq: Every day | ORAL | Status: DC
  Filled 2017-02-03: qty 1

## 2017-02-03 NOTE — Progress Notes (Addendum)
[address]       [place] [address]             [phone]      5 Days Post-Op Procedure(s) (LRB): AORTIC VALVE REPLACEMENT (AVR) (N/A) CORONARY ARTERY BYPASS GRAFTING (CABG)  x two, using left internal mammary artery and right leg greater saphenous vein harvested endoscopically (N/A) TRANSESOPHAGEAL ECHOCARDIOGRAM (TEE) (N/A) Subjective: Feels pretty well  Objective: Vital signs in last 24 hours: Temp:  [97.7 F (36.5 C)-98.6 F (37 C)] 97.7 F (36.5 C) (08/26 0921) Pulse Rate:  [49-103] 88 (08/26 0925) Cardiac Rhythm: Sinus tachycardia (08/25 1910) Resp:  [18] 18 (08/26 0921) BP: (89-111)/(59-66) 101/59 (08/26 0925) SpO2:  [97 %-98 %] 97 % (08/26 0921) Weight:  [184 lb 4.9 oz (83.6 kg)] 184 lb 4.9 oz (83.6 kg) (08/26 0422)  Hemodynamic parameters for last 24 hours:    Intake/Output from previous day: 08/25 0701 - 08/26 0700 In: 1080 [P.O.:1080] Out: -  Intake/Output this shift: Total I/O In: 240 [P.O.:240] Out: -   General appearance: alert, cooperative and no distress Heart: regular rate and rhythm Lungs: clear to auscultation bilaterally Abdomen: benign Extremities: + LE edema Wound: incis healing well  Lab Results:  Recent Labs  02/01/17 0410 02/02/17 0322  WBC 5.9 5.8  HGB 8.7* 8.9*  HCT 25.3* 26.3*  PLT 82* 123*   BMET:  Recent Labs  02/02/17 0322 02/03/17 0527  NA 135 136  K 3.5 3.1*  CL 100* 99*  CO2 27 26  GLUCOSE 110* 141*  BUN 21* 26*  CREATININE 0.96 1.08  CALCIUM 8.4* 8.9    PT/INR: No results for input(s): LABPROT, INR in the last 72 hours. ABG    Component Value Date/Time   PHART 7.382 01/30/2017 0244   HCO3 23.3 01/30/2017 0244   TCO2 27 01/31/2017 1731   ACIDBASEDEF 1.0 01/30/2017 0244   O2SAT 97.0 01/30/2017 0244   CBG (last 3)   Recent Labs  02/01/17 0357 02/01/17 0806 02/01/17 1158  GLUCAP 91 96 109*    Meds Scheduled Meds: . acetaminophen  1,000 mg Oral Q6H   Or  .  acetaminophen (TYLENOL) oral liquid 160 mg/5 mL  1,000 mg Per Tube Q6H  . aspirin EC  81 mg Oral Daily  . bisacodyl  10 mg Oral Daily   Or  . bisacodyl  10 mg Rectal Daily  . chlorhexidine gluconate (MEDLINE KIT)  15 mL Mouth Rinse BID  . clonazePAM  1 mg Oral QHS  . docusate sodium  200 mg Oral Daily  . ferrous OINOMVEH-[id number] C-folic acid  1 capsule Oral BID WC  . furosemide  40 mg Oral Daily  . gabapentin  300 mg Oral QHS  . guaiFENesin  600 mg Oral BID  . mouth rinse  15 mL Mouth Rinse QID  . metoprolol tartrate  25 mg Oral BID  . pantoprazole  40 mg Oral Daily  . potassium chloride  40 mEq Oral Daily  . sodium chloride flush  3 mL Intravenous Q12H  . sodium chloride flush  3 mL Intravenous Q12H   Continuous Infusions: . sodium chloride 20 mL/hr at 01/30/17 1756  . sodium chloride Stopped (01/29/17 1440)  . sodium chloride 20 mL/hr at 01/30/17 1356  . sodium chloride     PRN Meds:.sodium chloride, sodium chloride, metoprolol tartrate, ondansetron (ZOFRAN) IV, oxyCODONE, sodium chloride flush, sodium chloride flush, traMADol  Xrays Dg Chest 2 View  Result Date: 02/02/2017 CLINICAL  DATA:  Status post aortic valve replacement EXAM: CHEST  2 VIEW COMPARISON:  02/01/2017 FINDINGS: Cardiac shadow is stable. Postsurgical changes are again noted. The lungs are well aerated bilaterally with small bilateral pleural effusions. Right-sided chest tube has been removed in the interval. The right pneumothorax is stable. No new focal abnormality is seen. IMPRESSION: Stable right pneumothorax. Stable bibasilar changes. Interval chest tube removal on the right. Electronically Signed   By: Inez Catalina M.D.   On: 02/02/2017 14:29    Assessment/Plan: S/P Procedure(s) (LRB): AORTIC VALVE REPLACEMENT (AVR) (N/A) CORONARY ARTERY BYPASS GRAFTING (CABG)  x two, using left internal mammary artery and right leg greater saphenous vein harvested endoscopically (N/A) TRANSESOPHAGEAL ECHOCARDIOGRAM  (TEE) (N/A)  1 doing well, sinus rhythm, hemodyn stable, tachycardia has improved 2 d/c epw's today 3 H/H stable 4 replace K+ 5 H/H stable 6 Creat stable 7 sugars adeq controlled, A1C 5.3 which correlates with avg BS of 111- diet control in this setting 8 routine pulm toilet- repeat CXR in am to f/u pntx 9 routine cardiac rehab 10 home in am if no mew issues  LOS: 13 days    GOLD,WAYNE E 02/03/2017   I have seen and examined the patient and agree with the assessment and plan as outlined.  Tentatively plan d/c home tomorrow.  Recheck CXR in am.  Rexene Alberts, MD 02/03/2017 12:38 PM

## 2017-02-03 NOTE — Discharge Summary (Signed)
Physician Discharge Summary  Patient ID: Jordan Hammond MRN: [id number] DOB/AGE: 76/08/1940 76 y.o.  Admit date: 01/19/2017 Discharge date: 02/04/2017  Admission Diagnoses:Severe aortic stenosis and coronary artery disease  Discharge Diagnoses:  Active Problems:   Chest pain at rest   Bradycardia   Restless leg syndrome   Aortic stenosis   CAD (coronary artery disease), native coronary artery   Aortic atherosclerosis (HCC)   Unstable angina (HCC)   Precordial pain   S/P AVR (aortic valve replacement)  Patient Active Problem List   Diagnosis Date Noted  . 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  . Precordial pain   . Chest pain at rest 01/20/2017  . Bradycardia 01/20/2017  . Restless leg syndrome 01/20/2017  . Aortic stenosis 01/20/2017  . CAD (coronary artery disease), native coronary artery 01/20/2017  . Aortic atherosclerosis (HCC) 01/20/2017  . Unstable angina (HCC) 01/20/2017   HPI: Jordan Hammond is a 76 y.o. male with medical history significant of RLS, heart murmur, GERD s/p nissan fundiplication who presents with chest pain.  He reports that the pain started around 930pm on Friday, was similar to a tightness and radiated to the back.  At worst a 7/10.  It improved with nitroglycerin and aspirin.  The next day around 10:30am it started again, and then recurred again and EMS was called.  The symptoms started at rest.  He has a good exercise tolerance and has not had chest pain with exercise recently.  He had no nausea, arm or jaw radiation, no diaphoresis or SOB.  He has lost 20# in the last 60 days purposefully as he has stopped eating as much.  He does report worsening fatigue and lightheadedness associated with bending over which has seemed to becoming more frequent in the last 2-3 months.  He has a known heart murmur.  He reports seeing Dr. Arlyn Leak (Dr. Sabino Donovan wife is apparently Mr. Hanser niece).  He had a TTE in February of this year which was reported as stable, but I  cannot see in our system.  A TTE from 2016 showed mild LVH, mild AS with gradient of , mild aortic regurg.  Of note, on the monitor, he was having bradycardia into the low 50s, sinus. He is not on a beta blocker.  He has an extensive history of CAD in his father's side of the family.  He is a never smoker.  The pain is not reproducible.   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 He was due to be in 2 performances this weekend.  He remembers a time a few years ago where he had increased tightness in his chest after singing more than usual and he wonders if this could be the culprit.    The patient was admitted with chest pain at rest suggestive of unstable angina for further evaluation and treatment. This would include cardiology consultation.   Discharged Condition: good  Hospital Course: The patient was admitted to the emergency department for further evaluation. It was felt he would require both TTE as well as cardiac catheterization to further evaluate. These showed significant aortic stenosis as well as coronary artery disease. Cardiothoracic surgical consultation was obtained and it was felt he would require aortic valve replacement with CABG. The procedure was scheduled and on 01/29/2017 he was taken to the operating room where he underwent the below described procedure. He tolerated well was taken to the surgical intensive care  unit in stable condition.   Postoperative course:  Overall the patient has progressed quite nicely. He did have some initial nausea but was able to be weaned from the ventilator without significant difficulty. He required some inotropic support but these were able to be weaned without difficulty. Early he required some AAI pacing but has resumed sinus rhythm. He has some postoperative volume overload but is responding to diuretics. He has an expected acute blood loss anemia  which has stabilized. He is tolerating gradually increasing activities using standard protocols with cardiac rehabilitation. His creatinine is stable. He does have a small right pneumothorax which is stable and oxygen saturations are good on room air. Incisions are noted be healing well without evidence of infection. He is tolerating diet. He has been seen and evaluated by Dr. Donata Clay and is felt surgically stable for discharge  Consults: cardiology  Significant Diagnostic Studies: angiography: Cardiac catheterization/echocardiogram/CT angiogram of the chest  Treatments: surgery:  DATE OF PROCEDURE:  01/29/2017 DATE OF DISCHARGE:                              OPERATIVE REPORT   OPERATIONS: 1. Coronary artery bypass grafting x2 (left internal mammary artery to     left anterior descending, saphenous vein graft to obtuse marginal). 2. 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  PREOPERATIVE DIAGNOSES:  Unstable angina, non-ST elevation myocardial infarction, severe 2 vessel coronary artery disease, moderate aortic stenosis.  POSTOPERATIVE DIAGNOSES:  Unstable angina, non-ST elevation myocardial infarction, severe 2-vessel coronary artery disease, moderate aortic stenosis.  ANESTHESIA:  General by Germaine Pomfret, M.D.  Discharge Exam: Blood pressure 94/60, pulse 83, temperature 99.5 F (37.5 C), temperature source Oral, resp. rate 18, height 6' (1.829 m), weight 184 lb 8 oz (83.7 kg), SpO2 99 %.  Cardiovascular: RRR, no murmur Pulmonary: Slightly diminished at bases Abdomen: Soft, non tender, bowel sounds present. Extremities: Mild bilateral lower extremity edema R>L Wounds: Clean and dry.  No erythema or signs of infection.  Disposition: Final discharge disposition not confirmed  Discharge Instructions    Amb Referral to Cardiac Rehabilitation    Complete by:  As directed    Referring to [facility] program    Diagnosis:   CABG Valve Replacement     Valve:  Aortic   CABG X ___:  2     Allergies as of 02/04/2017      Reactions   Aspirin Nausea And Vomiting   Very heavy doses   Erythromycin Nausea And Vomiting   Unsure if there are other reactions   Ibuprofen Nausea And Vomiting   Very heavy doses   Nabumetone Nausea And Vomiting   Nsaids Nausea And Vomiting   Very heavy doses   Sulfa Antibiotics    Tetracycline Nausea And Vomiting   Unsure if there are other reactions   Sulfamethoxazole Itching, Rash, Swelling   Sulfur Itching, Rash, Swelling      Medication List    STOP taking these medications   nitroGLYCERIN 0.4 MG SL tablet Commonly known as:  NITROSTAT     TAKE these medications   acetaminophen 500 MG tablet Commonly known as:  TYLENOL Take 1,000 mg by mouth every 6 (six) hours as needed for headache.   aspirin 81 MG EC tablet Take 1 tablet (81 mg total) by mouth daily.   atorvastatin 20 MG tablet Commonly known  as:  LIPITOR Take 1 tablet (20 mg total) by mouth daily at 6 PM.   CALCIUM + D 315-200 MG-UNIT tablet Generic drug:  calcium citrate-vitamin D Take 1 tablet by mouth daily.   clonazePAM 1 MG tablet Commonly known as:  KLONOPIN Take 1 mg by mouth at bedtime.   ferrous sulfate 325 (65 FE) MG tablet Take 1 tablet (325 mg total) by mouth daily. For one month then stop.   Flaxseed Oil Oil Take 1,000 mg by mouth daily.   furosemide 40 MG tablet Commonly known as:  LASIX Take 1 tablet (40 mg total) by mouth daily. For 5 days then stop.   gabapentin 300 MG capsule Commonly known as:  NEURONTIN Take 300 mg by mouth daily.   metoprolol tartrate 25 MG tablet Commonly known as:  LOPRESSOR Take 1 tablet (25 mg total) by mouth 2 (two) times daily.   Potassium Chloride ER 20 MEQ Tbcr Take 20 mEq by mouth daily. For 5 days then stop.   traMADol 50 MG tablet Commonly known as:  ULTRAM Take 50 mg by mouth every 4-6 hours PRN moderate or severe pain.    Vitamin D3 5000 units Caps Take 5,000 Units by mouth daily.            Discharge Care Instructions        Start     Ordered   02/04/17 0000  aspirin EC 81 MG EC tablet  Daily     02/04/17 0815   02/04/17 0000  atorvastatin (LIPITOR) 20 MG tablet  Daily-1800    Question:  Supervising Provider  Answer:  Donata Clay, PETER   02/04/17 0815   02/04/17 0000  furosemide (LASIX) 40 MG tablet  Daily    Question:  Supervising Provider  Answer:  Donata Clay, PETER   02/04/17 0815   02/04/17 0000  metoprolol tartrate (LOPRESSOR) 25 MG tablet  2 times daily    Question:  Supervising Provider  Answer:  Donata Clay, PETER   02/04/17 0815   02/04/17 0000  traMADol (ULTRAM) 50 MG tablet    Question:  Supervising Provider  Answer:  Donata Clay, PETER   02/04/17 0815   02/04/17 0000  potassium chloride 20 MEQ TBCR  Daily    Question:  Supervising Provider  Answer:  Donata Clay, PETER   02/04/17 0815   02/04/17 0000  ferrous sulfate 325 (65 FE) MG tablet  Daily    Question:  Supervising Provider  Answer:  Donata Clay, PETER   02/04/17 0815   02/02/17 0000  Amb Referral to Cardiac Rehabilitation    Comments:  Referring to [facility] program  Question Answer Comment  Diagnosis: CABG   Diagnosis: Valve Replacement   Valve: Aortic   CABG X ___ 2      02/02/17 1116     Follow-up Information    Kerin Perna, MD Follow up.   Specialty:  Cardiothoracic Surgery Why:  Office will contact you with a 4 week appointment to see the surgeon. Please obtain a chest x-ray at [facility] imaging (which is in the same building as Dr. Zenaida Niece Trigt's office, on the ground floor) 30 minutes prior to office appointment.  Contact information: [address] [place] [address] [phone]        Othella Boyer, MD Follow up.   Specialty:  Cardiology Why:  Please call the office to arrange a 2 week appointment Contact information: [address] [place]  [address] [phone]         The patient has been discharged on:   1.Beta Blocker:  Yes [ y  ]                              No   [   ]                              If No, reason:  2.Ace Inhibitor/ARB: Yes [   ]                                     No  [ n   ]                                     If No, reason:low BP  3.Statin:   Yes [place]ienne   ]                  No  [   ]                  If No, reason:  4.Ecasa:  Yes  [ y  ]                  No   [   ]                  If No, reason:   Signed: ZIMMERMAN,DONIELLE M PA-C 02/04/2017, 8:20 AM

## 2017-02-03 NOTE — Progress Notes (Signed)
Patient education given for removal of pacer wires, pt verbalised understanding, 4 pacer wires removed as ordered, patient now on bedrest will continue to monitor.

## 2017-02-03 NOTE — Discharge Instructions (Signed)
Aortic Valve Replacement, Care After °Refer to this sheet in the next few weeks. These instructions provide you with information about caring for yourself after your procedure. Your health care provider may also give you more specific instructions. Your treatment has been planned according to current medical practices, but problems sometimes occur. Call your health care provider if you have any problems or questions after your procedure. °What can I expect after the procedure? °After the procedure, it is common to have: °· Pain around your incision area. °· A small amount of blood or clear fluid coming from your incision. ° °Follow these instructions at home: °Eating and drinking ° °· Follow instructions from your health care provider about eating or drinking restrictions. °? Limit alcohol intake to no more than 1 drink per day for nonpregnant women and 2 drinks per day for men. One drink equals 12 oz of beer, 5 oz of wine, or 1½ oz of hard liquor. °? Limit how much caffeine you drink. Caffeine can affect your heart's rate and rhythm. °· Drink enough fluid to keep your urine clear or pale yellow. °· Eat a heart-healthy diet. This should include plenty of fresh fruits and vegetables. If you eat meat, it should be lean cuts. Avoid foods that are: °? High in salt, saturated fat, or sugar. °? Canned or highly processed. °? Fried. °Activity °· Return to your normal activities as told by your health care provider. Ask your health care provider what activities are safe for you. °· Exercise regularly once you have recovered, as told by your health care provider. °· Avoid sitting for more than 2 hours at a time without moving. Get up and move around at least once every 1-2 hours. This helps to prevent blood clots in the legs. °· Do not lift anything that is heavier than 10 lb (4.5 kg) until your health care provider approves. °· Avoid pushing or pulling things with your arms until your health care provider approves. This  includes pulling on handrails to help you climb stairs. °Incision care ° °· Follow instructions from your health care provider about how to take care of your incision. Make sure you: °? Wash your hands with soap and water before you change your bandage (dressing). If soap and water are not available, use hand sanitizer. °? Change your dressing as told by your health care provider. °? Leave stitches (sutures), skin glue, or adhesive strips in place. These skin closures may need to stay in place for 2 weeks or longer. If adhesive strip edges start to loosen and curl up, you may trim the loose edges. Do not remove adhesive strips completely unless your health care provider tells you to do that. °· Check your incision area every day for signs of infection. Check for: °? More redness, swelling, or pain. °? More fluid or blood. °? Warmth. °? Pus or a bad smell. °Medicines °· Take over-the-counter and prescription medicines only as told by your health care provider. °· If you were prescribed an antibiotic medicine, take it as told by your health care provider. Do not stop taking the antibiotic even if you start to feel better. °Travel °· Avoid airplane travel for as long as told by your health care provider. °· When you travel, bring a list of your medicines and a record of your medical history with you. Carry your medicines with you. °Driving °· Ask your health care provider when it is safe for you to drive. Do not drive until your health   care provider approves. °· Do not drive or operate heavy machinery while taking prescription pain medicine. °Lifestyle ° °· Do not use any tobacco products, such as cigarettes, chewing tobacco, or e-cigarettes. If you need help quitting, ask your health care provider. °· Resume sexual activity as told by your health care provider. Do not use medicines for erectile dysfunction unless your health care provider approves, if this applies. °· Work with your health care provider to keep your  blood pressure and cholesterol under control, and to manage any other heart conditions that you have. °· Maintain a healthy weight. °General instructions °· Do not take baths, swim, or use a hot tub until your health care provider approves. °· Do not strain to have a bowel movement. °· Avoid crossing your legs while sitting down. °· Check your temperature every day for a fever. A fever may be a sign of infection. °· If you are a woman and you plan to become pregnant, talk with your health care provider before you become pregnant. °· Wear compression stockings if your health care provider instructs you to do this. These stockings help to prevent blood clots and reduce swelling in your legs. °· Tell all health care providers who care for you that you have an artificial (prosthetic) aortic valve. If you have or have had heart disease or endocarditis, tell all health care providers about these conditions as well. °· Keep all follow-up visits as told by your health care provider. This is important. °Contact a health care provider if: °· You develop a skin rash. °· You experience sudden, unexplained changes in your weight. °· You have more redness, swelling, or pain around your incision. °· You have more fluid or blood coming from your incision. °· Your incision feels warm to the touch. °· You have pus or a bad smell coming from your incision. °· You have a fever. °Get help right away if: °· You develop chest pain that is different from the pain coming from your incision. °· You develop shortness of breath or difficulty breathing. °· You start to feel light-headed. °These symptoms may represent a serious problem that is an emergency. Do not wait to see if the symptoms will go away. Get medical help right away. Call your local emergency services (911 in the [place] Do not drive yourself to the hospital. °This information is not intended to replace advice given to you by your health care provider. Make sure you discuss any  questions you have with your health care provider. °Document Released: 12/14/2004 Document Revised: 11/03/2015 Document Reviewed: 05/01/2015 °Elsevier Interactive Patient Education © 2017 [employer] ° °Coronary Artery Bypass Grafting, Care After °These instructions give you information on caring for yourself after your procedure. Your doctor may also give you more specific instructions. Call your doctor if you have any problems or questions after your procedure. °Follow these instructions at home: °· Only take medicine as told by your doctor. Take medicines exactly as told. Do not stop taking medicines or start any new medicines without talking to your doctor first. °· Take your pulse as told by your doctor. °· Do deep breathing as told by your doctor. Use your breathing device (incentive spirometer), if given, to practice deep breathing several times a day. Support your chest with a pillow or your arms when you take deep breaths or cough. °· Keep the area clean, dry, and protected where the surgery cuts (incisions) were made. Remove bandages (dressings) only as told by your doctor. If   strips were applied to surgical area, do not take them off. They fall off on their own. °· Check the surgery area daily for puffiness (swelling), redness, or leaking fluid. °· If surgery cuts were made in your legs: °? Avoid crossing your legs. °? Avoid sitting for long periods of time. Change positions every 30 minutes. °? Raise your legs when you are sitting. Place them on pillows. °· Wear stockings that help keep blood clots from forming in your legs (compression stockings). °· Only take sponge baths until your doctor says it is okay to take showers. Pat the surgery area dry. Do not rub the surgery area with a washcloth or towel. Do not bathe, swim, or use a hot tub until your doctor says it is okay. °· Eat foods that are high in fiber. These include raw fruits and vegetables, whole grains, beans, and nuts. Choose lean meats.  Avoid canned, processed, and fried foods. °· Drink enough fluids to keep your pee (urine) clear or pale yellow. °· Weigh yourself every day. °· Rest and limit activity as told by your doctor. You may be told to: °? Stop any activity if you have chest pain, shortness of breath, changes in heartbeat, or dizziness. Get help right away if this happens. °? Move around often for short amounts of time or take short walks as told by your doctor. Gradually become more active. You may need help to strengthen your muscles and build endurance. °? Avoid lifting, pushing, or pulling anything heavier than 10 pounds (4.5 kg) for at least 6 weeks after surgery. °· Do not drive until your doctor says it is okay. °· Ask your doctor when you can go back to work. °· Ask your doctor when you can begin sexual activity again. °· Follow up with your doctor as told. °Contact a doctor if: °· You have puffiness, redness, more pain, or fluid draining from the incision site. °· You have a fever. °· You have puffiness in your ankles or legs. °· You have pain in your legs. °· You gain 2 or more pounds (0.9 kg) a day. °· You feel sick to your stomach (nauseous) or throw up (vomit). °· You have watery poop (diarrhea). °Get help right away if: °· You have chest pain that goes to your jaw or arms. °· You have shortness of breath. °· You have a fast or irregular heartbeat. °· You notice a "clicking" in your breastbone when you move. °· You have numbness or weakness in your arms or legs. °· You feel dizzy or light-headed. °This information is not intended to replace advice given to you by your health care provider. Make sure you discuss any questions you have with your health care provider. °Document Released: 06/02/2013 Document Revised: 11/03/2015 Document Reviewed: 11/04/2012 °Elsevier Interactive Patient Education © 2017 [employer] Inc. ° °

## 2017-02-04 ENCOUNTER — Inpatient Hospital Stay (HOSPITAL_COMMUNITY): Payer: Medicare HMO

## 2017-02-04 MED ORDER — METOPROLOL TARTRATE 25 MG PO TABS: 25.0000 mg | ORAL_TABLET | Freq: Two times a day (BID) | ORAL | 1 refills | Status: DC

## 2017-02-04 MED ORDER — FERROUS SULFATE 325 (65 FE) MG PO TABS: 325.0000 mg | ORAL_TABLET | Freq: Every day | ORAL | 3 refills | Status: DC

## 2017-02-04 MED ORDER — ATORVASTATIN CALCIUM 20 MG PO TABS: 20.0000 mg | ORAL_TABLET | Freq: Every day | ORAL | 1 refills | Status: DC

## 2017-02-04 MED ORDER — POTASSIUM CHLORIDE ER 20 MEQ PO TBCR: 20.0000 meq | EXTENDED_RELEASE_TABLET | Freq: Every day | ORAL | 0 refills | Status: DC

## 2017-02-04 MED ORDER — TRAMADOL HCL 50 MG PO TABS: ORAL_TABLET | ORAL | 0 refills | Status: DC

## 2017-02-04 MED ORDER — FUROSEMIDE 40 MG PO TABS: 40.0000 mg | ORAL_TABLET | Freq: Every day | ORAL | 0 refills | Status: DC

## 2017-02-04 MED ORDER — ASPIRIN 81 MG PO TBEC
81.0000 mg | DELAYED_RELEASE_TABLET | Freq: Every day | ORAL | Status: AC
Start: 2017-02-04 — End: ?

## 2017-02-04 NOTE — Care Management Important Message (Signed)
Important Message  Patient Details  Name: Jordan Hammond MRN: [id number] Date of Birth: 11-Apr-1941   Medicare Important Message Given:  Yes    Abdulah Iqbal 02/04/2017, 1:59 PM

## 2017-02-04 NOTE — Care Management Note (Signed)
Case Management Note Previous CM note initiated by Leone Haven, RN--01/31/2017, 4:56 PM  Patient Details  Name: Jordan Hammond MRN: [id number] Date of Birth: [birth year]  Subjective/Objective:  From home with wife, pta indep, daughter , Marylene Land, will be assisting after discharge,  POD 2 CABG, AVR, extubated 8/21, cxr with 30% ptx, additional chest tube placed, conts on neo, norepi, dopamine. Has PCP and medication coverage.                  Action/Plan: NCM will follow for dc needs.   Expected Discharge Date:  02/04/17               Expected Discharge Plan:  Home/Self Care  In-House Referral:  NA  Discharge planning Services  CM Consult  Post Acute Care Choice:  NA Choice offered to:  NA  DME Arranged:    DME Agency:     HH Arranged:    HH Agency:     Status of Service:  Completed, signed off  If discussed at Long Length of Stay Meetings, dates discussed:    Discharge Disposition: home/self care   Additional Comments:  02/04/17- 1145- Marygrace Sandoval RN, CM- pt for d/c home today- no CM needs noted for discharge.   Zenda Alpers [place], RN 02/04/2017, 11:47 AM [phone]

## 2017-02-04 NOTE — Progress Notes (Signed)
Pt discharged. 3 sutures DC'd per order. Telemetry discontinued. IVs removed. Paper Rx given with discharge paperwork.  Leonidas Romberg, RN

## 2017-02-04 NOTE — Progress Notes (Signed)
1010-1030 Pt requested that education be repeated. Reviewed sternal precautions, IS and flutter valve, ex ed and heart healthy diet. Referring to [facility] Phase 2. Pt has watched discharge video. Luetta Nutting RN BSN 02/04/2017 10:29 AM

## 2017-02-04 NOTE — Progress Notes (Addendum)
      [address]       [place] [address]             [phone]        6 Days Post-Op Procedure(s) (LRB): AORTIC VALVE REPLACEMENT (AVR) (N/A) CORONARY ARTERY BYPASS GRAFTING (CABG)  x two, using left internal mammary artery and right leg greater saphenous vein harvested endoscopically (N/A) TRANSESOPHAGEAL ECHOCARDIOGRAM (TEE) (N/A)  Subjective: Patient without complaints this am.  Objective: Vital signs in last 24 hours: Temp:  [97.7 F (36.5 C)-99.5 F (37.5 C)] 99.5 F (37.5 C) (08/27 0514) Pulse Rate:  [49-88] 83 (08/26 2135) Cardiac Rhythm: Normal sinus rhythm (08/26 1908) Resp:  [15-18] 18 (08/27 0514) BP: (89-117)/(52-81) 94/60 (08/27 0514) SpO2:  [97 %-99 %] 99 % (08/26 2135) Weight:  [184 lb 8 oz (83.7 kg)] 184 lb 8 oz (83.7 kg) (08/27 0514)  Pre op weight 81 kg Current Weight  02/04/17 184 lb 8 oz (83.7 kg)       Intake/Output from previous day: 08/26 0701 - 08/27 0700 In: 720 [P.O.:720] Out: -    Physical Exam:  Cardiovascular: RRR, no murmur Pulmonary: Slightly diminished at bases Abdomen: Soft, non tender, bowel sounds present. Extremities: Mild bilateral lower extremity edema R>L Wounds: Clean and dry.  No erythema or signs of infection.  Lab Results: CBC: Recent Labs  02/02/17 0322  WBC 5.8  HGB 8.9*  HCT 26.3*  PLT 123*   BMET:  Recent Labs  02/02/17 0322 02/03/17 0527  NA 135 136  K 3.5 3.1*  CL 100* 99*  CO2 27 26  GLUCOSE 110* 141*  BUN 21* 26*  CREATININE 0.96 1.08  CALCIUM 8.4* 8.9    PT/INR:  Lab Results  Component Value Date   INR 1.46 01/29/2017   INR 0.98 01/24/2017   INR 1.09 01/21/2017   ABG:  INR: Will add last result for INR, ABG once components are confirmed Will add last 4 CBG results once components are confirmed  Assessment/Plan:  1. CV - SR in the 90's this am. On Lopressor 25 mg bid. 2.  Pulmonary - On room air. Encourage incentive spirometer. 3. Volume Overload - On  Lasix 40 mg daily. Will continue for several days after discharge as still above pre op weight. 4.  Acute blood loss anemia - Last H and H 8.9 and 26.3. Will continue oral ferrous at discharge 5. Thrombocytopenia-last platelets 123,000 6.5  Small stable R pneumothorax 6. Remove sutures 7. Discharge after lunch today  ZIMMERMAN,DONIELLE MPA-C 02/04/2017,7:54 AM   patient examined and medical record reviewed,agree with above note. Kathlee Nations Trigt III 02/04/2017

## 2017-02-06 ENCOUNTER — Other Ambulatory Visit: Payer: Self-pay | Admitting: Physician Assistant

## 2017-02-14 ENCOUNTER — Other Ambulatory Visit: Payer: Self-pay | Admitting: *Deleted

## 2017-02-14 DIAGNOSIS — G8918 Other acute postprocedural pain: Secondary | ICD-10-CM

## 2017-02-14 MED ORDER — TRAMADOL HCL 50 MG PO TABS: ORAL_TABLET | ORAL | 0 refills | Status: DC

## 2017-02-14 NOTE — Telephone Encounter (Signed)
[name] is s/p CABG 01/29/17 and was discharged on 02/04/17 with Tramadol for pain. His wife has called for a refill. He continues with pain particularly in his EVH leg and back. I informed her that I would have Dr. Tyrone SageGerhardt sign a new script and fax it to their preferred pharmacy. She agreed.

## 2017-02-18 DIAGNOSIS — E663 Overweight: Secondary | ICD-10-CM | POA: Diagnosis not present

## 2017-02-18 DIAGNOSIS — Z951 Presence of aortocoronary bypass graft: Secondary | ICD-10-CM | POA: Diagnosis not present

## 2017-02-18 DIAGNOSIS — I350 Nonrheumatic aortic (valve) stenosis: Secondary | ICD-10-CM | POA: Diagnosis not present

## 2017-02-18 DIAGNOSIS — Z952 Presence of prosthetic heart valve: Secondary | ICD-10-CM | POA: Diagnosis not present

## 2017-02-18 DIAGNOSIS — E785 Hyperlipidemia, unspecified: Secondary | ICD-10-CM | POA: Diagnosis not present

## 2017-02-18 DIAGNOSIS — I2510 Atherosclerotic heart disease of native coronary artery without angina pectoris: Secondary | ICD-10-CM | POA: Diagnosis not present

## 2017-02-18 DIAGNOSIS — R0602 Shortness of breath: Secondary | ICD-10-CM | POA: Diagnosis not present

## 2017-02-18 DIAGNOSIS — R0609 Other forms of dyspnea: Secondary | ICD-10-CM | POA: Diagnosis not present

## 2017-02-21 ENCOUNTER — Encounter: Payer: Self-pay | Admitting: Cardiology

## 2017-02-21 DIAGNOSIS — E785 Hyperlipidemia, unspecified: Secondary | ICD-10-CM | POA: Insufficient documentation

## 2017-02-26 ENCOUNTER — Other Ambulatory Visit: Payer: Self-pay

## 2017-02-26 DIAGNOSIS — G8918 Other acute postprocedural pain: Secondary | ICD-10-CM

## 2017-02-26 MED ORDER — TRAMADOL HCL 50 MG PO TABS: ORAL_TABLET | ORAL | 0 refills | Status: DC

## 2017-02-26 NOTE — Telephone Encounter (Signed)
RX refill for Tramadol was faxed to  [facility]

## 2017-03-05 ENCOUNTER — Other Ambulatory Visit: Payer: Self-pay | Admitting: Cardiothoracic Surgery

## 2017-03-05 DIAGNOSIS — Z951 Presence of aortocoronary bypass graft: Secondary | ICD-10-CM

## 2017-03-05 DIAGNOSIS — Z952 Presence of prosthetic heart valve: Secondary | ICD-10-CM

## 2017-03-06 ENCOUNTER — Ambulatory Visit
Admission: RE | Admit: 2017-03-06 | Discharge: 2017-03-06 | Disposition: A | Discharge status: Home / Self Care | Payer: Medicare HMO | Source: Ambulatory Visit | Attending: Cardiothoracic Surgery | Admitting: Cardiothoracic Surgery

## 2017-03-06 ENCOUNTER — Ambulatory Visit (INDEPENDENT_AMBULATORY_CARE_PROVIDER_SITE_OTHER): Payer: Self-pay | Admitting: Cardiothoracic Surgery

## 2017-03-06 VITALS — BP 94/65 | HR 70 | Resp 16 | Ht 72.0 in | Wt 176.6 lb

## 2017-03-06 DIAGNOSIS — Z951 Presence of aortocoronary bypass graft: Secondary | ICD-10-CM

## 2017-03-06 DIAGNOSIS — I25810 Atherosclerosis of coronary artery bypass graft(s) without angina pectoris: Secondary | ICD-10-CM | POA: Diagnosis not present

## 2017-03-06 DIAGNOSIS — Z952 Presence of prosthetic heart valve: Secondary | ICD-10-CM

## 2017-03-06 DIAGNOSIS — I350 Nonrheumatic aortic (valve) stenosis: Secondary | ICD-10-CM

## 2017-03-06 DIAGNOSIS — I2510 Atherosclerotic heart disease of native coronary artery without angina pectoris: Secondary | ICD-10-CM

## 2017-03-06 NOTE — Progress Notes (Signed)
PCP is System, Pcp Not In Referring Provider is Othella Boyer, MD  Chief Complaint  Patient presents with  . Routine Post Op    4 wk f/u s/p CABG/AVR 01/29/17 with a CXR    HPI:The patient returns for scheduled one month postop visit after combined aVR-CABG 2 for unstable angina, moderate aortic stenosis and severe 2 vessel CAD. He has done well at home. He is walking daily 1/3-1/2 mile per day. No recurrent angina or symptoms of pain. He takes tramadol twice a day as needed. Surgical incisions are well-healed. Weight is stable and energy and exercise tolerance have improved. He still feels somewhat weak but was reassured that with time in cardiac rehabilitation his strength will improve. He is scheduled to start cardiac rehabilitation at [facility]. He will be able to start driving now and lift up to 15-20 pounds maximum until his next visit in 8 weeks. He was discharged home on aspirin in sinus rhythm.  Chest x-ray performed today personally reviewed showing clear lung fields no pleural effusion, sternal wires intact. Past Medical History:  Diagnosis Date  . Aortic atherosclerosis (HCC) 01/20/2017  . Aortic stenosis 01/20/2017  . CAD (coronary artery disease), native coronary artery 01/20/2017  . GERD (gastroesophageal reflux disease)   . Hiatal hernia   . Hyperlipidemia   . Kidney stones   . Restless leg syndrome     Past Surgical History:  Procedure Laterality Date  . AORTIC VALVE REPLACEMENT N/A 01/29/2017   Procedure: AORTIC VALVE REPLACEMENT (AVR);  Surgeon: Donata Clay, Theron Arista, MD;  Location: [facility] OR;  Service: Open Heart Surgery;  Laterality: N/A;  . APPENDECTOMY    . bowel obstruction surgery    . CHOLECYSTECTOMY    . CORONARY ARTERY BYPASS GRAFT N/A 01/29/2017   Procedure: CORONARY ARTERY BYPASS GRAFTING (CABG)  x two, using left internal mammary artery and right leg greater saphenous vein harvested endoscopically;  Surgeon: Kerin Perna, MD;  Location: [facility] OR;   Service: Open Heart Surgery;  Laterality: N/A;  . KNEE ARTHROSCOPY    . lap nissan    . LEFT HEART CATH AND CORONARY ANGIOGRAPHY N/A 01/21/2017   Procedure: LEFT HEART CATH AND CORONARY ANGIOGRAPHY;  Surgeon: Runell Gess, MD;  Location: [facility] INVASIVE CV LAB;  Service: Cardiovascular;  Laterality: N/A;  . RIGHT HEART CATH N/A 01/21/2017   Procedure: RIGHT HEART CATH;  Surgeon: Runell Gess, MD;  Location[facility] INVASIVE CV LAB;  Service: Cardiovascular;  Laterality: N/A;  . SHOULDER SURGERY    . TEE WITHOUT CARDIOVERSION N/A 01/29/2017   Procedure: TRANSESOPHAGEAL ECHOCARDIOGRAM (TEE);  Surgeon: Donata Clay, Theron Arista, MD;  Location: [facility] OR;  Service: Open Heart Surgery;  Laterality: N/A;    Family History  Problem Relation Age of Onset  . CAD Father        s/p triple bypass  . Dementia Mother     Social History Social History  Substance Use Topics  . Smoking status: Never Smoker  . Smokeless tobacco: Never Used  . Alcohol use No    Current Outpatient Prescriptions  Medication Sig Dispense Refill  . acetaminophen (TYLENOL) 500 MG tablet Take 1,000 mg by mouth every 6 (six) hours as needed for headache.    Marland Kitchen aspirin EC 81 MG EC tablet Take 1 tablet (81 mg total) by mouth daily.    Marland Kitchen atorvastatin (LIPITOR) 20 MG tablet Take 1 tablet (20 mg total) by mouth daily at 6 PM. 30 tablet 1  . calcium  citrate-vitamin D (CALCIUM + D) 315-200 MG-UNIT tablet Take 1 tablet by mouth daily.    . Cholecalciferol (VITAMIN D3) 5000 units CAPS Take 5,000 Units by mouth daily.    . clonazePAM (KLONOPIN) 1 MG tablet Take 1 mg by mouth at bedtime.    . Flaxseed Oil OIL Take 1,000 mg by mouth daily.    Marland Kitchen gabapentin (NEURONTIN) 300 MG capsule Take 300 mg by mouth daily.    . metoprolol tartrate (LOPRESSOR) 25 MG tablet Take 1 tablet (25 mg total) by mouth 2 (two) times daily. 60 tablet 1  . traMADol (ULTRAM) 50 MG tablet Take 50 mg by mouth every 4-6 hours PRN moderate or severe pain. 30 tablet 0   No  current facility-administered medications for this visit.     Allergies  Allergen Reactions  . Aspirin Nausea And Vomiting    Very heavy doses  . Erythromycin Nausea And Vomiting    Unsure if there are other reactions  . Ibuprofen Nausea And Vomiting    Very heavy doses  . Nabumetone Nausea And Vomiting  . Nsaids Nausea And Vomiting    Very heavy doses  . Sulfa Antibiotics   . Tetracycline Nausea And Vomiting    Unsure if there are other reactions  . Sulfamethoxazole Itching, Rash and Swelling  . Sulfur Itching, Rash and Swelling    Review of Systems   Good initial recovery after aVR-CABG Incisions healing No ankle edema No sternal click or popping   BP 94/65 (BP Location: Right Arm, Patient Position: Sitting, Cuff Size: Large)   Pulse 70   Resp 16   Ht 6' (1.829 m)   Wt 176 lb 9.6 oz (80.1 kg)   SpO2 96% Comment: ON RA  BMI 23.95 kg/m  Physical Exam      Exam    General- alert and comfortable   Lungs- clear without rales, wheezes   Cor- regular rate and rhythm, no murmur , gallop   Abdomen- soft, non-tender   Extremities - warm, non-tender, minimal edema   Neuro- oriented, appropriate, no focal weakness   Diagnostic Tests: Chest x-ray clear Last hemoglobin prior to discharge from the hospital was 9 g Impression: Excellent early recovery after aVR CABG  Plan: Patient is ready to drive and do normal activities with lifting limit at 15-20 pounds. He will start cardiac rehabilitation as arranged by Dr. Donnie Aho I will see him back in 8 weeks to monitor progress. He is encouraged to take a 20 minute walk on the days that he does not go to cardiac rehabilitation.  Mikey Bussing, MD [facility] [phone]

## 2017-03-12 DIAGNOSIS — Z951 Presence of aortocoronary bypass graft: Secondary | ICD-10-CM | POA: Diagnosis not present

## 2017-03-13 ENCOUNTER — Other Ambulatory Visit: Payer: Self-pay | Admitting: Cardiothoracic Surgery

## 2017-03-13 ENCOUNTER — Other Ambulatory Visit: Payer: Self-pay | Admitting: *Deleted

## 2017-03-13 DIAGNOSIS — Z951 Presence of aortocoronary bypass graft: Secondary | ICD-10-CM | POA: Diagnosis not present

## 2017-03-13 DIAGNOSIS — G8918 Other acute postprocedural pain: Secondary | ICD-10-CM

## 2017-03-13 MED ORDER — TRAMADOL HCL 50 MG PO TABS: ORAL_TABLET | ORAL | 0 refills | Status: DC

## 2017-03-14 DIAGNOSIS — Z951 Presence of aortocoronary bypass graft: Secondary | ICD-10-CM | POA: Diagnosis not present

## 2017-04-01 DIAGNOSIS — R0602 Shortness of breath: Secondary | ICD-10-CM | POA: Diagnosis not present

## 2017-04-01 DIAGNOSIS — I2510 Atherosclerotic heart disease of native coronary artery without angina pectoris: Secondary | ICD-10-CM | POA: Diagnosis not present

## 2017-04-03 ENCOUNTER — Other Ambulatory Visit: Payer: Self-pay | Admitting: Physician Assistant

## 2017-04-05 DIAGNOSIS — H5210 Myopia, unspecified eye: Secondary | ICD-10-CM | POA: Diagnosis not present

## 2017-04-08 DIAGNOSIS — I359 Nonrheumatic aortic valve disorder, unspecified: Secondary | ICD-10-CM | POA: Diagnosis not present

## 2017-04-15 DIAGNOSIS — I2510 Atherosclerotic heart disease of native coronary artery without angina pectoris: Secondary | ICD-10-CM | POA: Diagnosis not present

## 2017-04-15 DIAGNOSIS — L819 Disorder of pigmentation, unspecified: Secondary | ICD-10-CM | POA: Diagnosis not present

## 2017-04-15 DIAGNOSIS — Z23 Encounter for immunization: Secondary | ICD-10-CM | POA: Diagnosis not present

## 2017-04-15 DIAGNOSIS — Z954 Presence of other heart-valve replacement: Secondary | ICD-10-CM | POA: Diagnosis not present

## 2017-04-15 DIAGNOSIS — E782 Mixed hyperlipidemia: Secondary | ICD-10-CM | POA: Diagnosis not present

## 2017-05-06 ENCOUNTER — Other Ambulatory Visit: Payer: Self-pay | Admitting: Surgical

## 2017-05-08 ENCOUNTER — Encounter: Payer: Self-pay | Admitting: Cardiothoracic Surgery

## 2017-05-08 ENCOUNTER — Ambulatory Visit (INDEPENDENT_AMBULATORY_CARE_PROVIDER_SITE_OTHER): Payer: Self-pay | Admitting: Cardiothoracic Surgery

## 2017-05-08 ENCOUNTER — Other Ambulatory Visit: Payer: Self-pay

## 2017-05-08 VITALS — BP 104/73 | HR 59 | Ht 72.0 in | Wt 182.0 lb

## 2017-05-08 DIAGNOSIS — Z952 Presence of prosthetic heart valve: Secondary | ICD-10-CM

## 2017-05-08 DIAGNOSIS — Z951 Presence of aortocoronary bypass graft: Secondary | ICD-10-CM

## 2017-05-08 NOTE — Progress Notes (Signed)
PCP is System, Pcp Not In Referring Provider is Othella Boyerilley, William S, MD  Chief Complaint  Patient presents with  . Follow-up    HPI: Final visit 3 months after aVR CABG 2. Patient doing very well walking one to 2 [facility] Patient anxious to increase exercise to start performing light dumbbell weight training which is fine as long as he does not lift more than 25 pounds until after 06/11/2017 No symptoms of angina or CHF. Last chest x-ray clear. Incisions all healing well.  Past Medical History:  Diagnosis Date  . Aortic atherosclerosis (HCC) 01/20/2017  . Aortic stenosis 01/20/2017  . CAD (coronary artery disease), native coronary artery 01/20/2017  . GERD (gastroesophageal reflux disease)   . Hiatal hernia   . Hyperlipidemia   . Kidney stones   . Restless leg syndrome     Past Surgical History:  Procedure Laterality Date  . AORTIC VALVE REPLACEMENT N/A 01/29/2017   Procedure: AORTIC VALVE REPLACEMENT (AVR);  Surgeon: Donata ClayVan Trigt, Theron AristaPeter, MD;  [facility];  Service: Open Heart Surgery;  Laterality: N/A;  . APPENDECTOMY    . bowel obstruction surgery    . CHOLECYSTECTOMY    . CORONARY ARTERY BYPASS GRAFT N/A 01/29/2017   Procedure: CORONARY ARTERY BYPASS GRAFTING (CABG)  x two, using left internal mammary artery and right leg greater saphenous vein harvested endoscopically;  Surgeon: Kerin PernaVan Trigt, Peter, MD;  [facility];  Service: Open Heart Surgery;  Laterality: N/A;  . KNEE ARTHROSCOPY    . lap nissan    . LEFT HEART CATH AND CORONARY ANGIOGRAPHY N/A 01/21/2017   Procedure: LEFT HEART CATH AND CORONARY ANGIOGRAPHY;  Surgeon: Runell GessBerry, Jonathan J, MD;  Location: [facility] INVASIVE CV LAB;  Service: Cardiovascular;  Laterality: N/A;  . RIGHT HEART CATH N/A 01/21/2017   Procedure: RIGHT HEART CATH;  Surgeon: Runell GessBerry, Jonathan J, MD;  [facility];  Service: Cardiovascular;  Laterality: N/A;  . SHOULDER SURGERY    . TEE WITHOUT CARDIOVERSION N/A 01/29/2017    Procedure: TRANSESOPHAGEAL ECHOCARDIOGRAM (TEE);  Surgeon: Donata ClayVan Trigt, Theron AristaPeter, MD;  [facility];  Service: Open Heart Surgery;  Laterality: N/A;    Family History  Problem Relation Age of Onset  . CAD Father        s/p triple bypass  . Dementia Mother     Social History Social History   Tobacco Use  . Smoking status: Never Smoker  . Smokeless tobacco: Never Used  Substance Use Topics  . Alcohol use: No  . Drug use: No    Current Outpatient Medications  Medication Sig Dispense Refill  . acetaminophen (TYLENOL) 500 MG tablet Take 1,000 mg by mouth every 6 (six) hours as needed for headache.    Marland Kitchen. aspirin EC 81 MG EC tablet Take 1 tablet (81 mg total) by mouth daily.    Marland Kitchen. atorvastatin (LIPITOR) 20 MG tablet TAKE 1 TABLET BY MOUTH DAILY AT 6:00 PM 30 tablet 0  . calcium citrate-vitamin D (CALCIUM + D) 315-200 MG-UNIT tablet Take 1 tablet by mouth daily.    . Cholecalciferol (VITAMIN D3) 5000 units CAPS Take 5,000 Units by mouth daily.    . clonazePAM (KLONOPIN) 1 MG tablet Take 1 mg by mouth at bedtime.    . Flaxseed Oil OIL Take 1,000 mg by mouth daily.    Marland Kitchen. gabapentin (NEURONTIN) 300 MG capsule Take 300 mg by mouth daily.    . metoprolol tartrate (LOPRESSOR) 25 MG tablet TAKE  1 TABLET BY MOUTH TWICE DAILY 60 tablet 0   No current facility-administered medications for this visit.     Allergies  Allergen Reactions  . Aspirin Nausea And Vomiting    Very heavy doses  . Erythromycin Nausea And Vomiting    Unsure if there are other reactions  . Fentanyl   . Ibuprofen Nausea And Vomiting    Very heavy doses  . Nabumetone Nausea And Vomiting  . Nsaids Nausea And Vomiting    Very heavy doses  . Sulfa Antibiotics   . Tetracycline Nausea And Vomiting    Unsure if there are other reactions  . Sulfamethoxazole Itching, Rash and Swelling  . Sulfur Itching, Rash and Swelling    Review of Systems  Minor complaints about increased appetite Minor complained about feeling  cold Patient reassured on both  BP 104/73 (BP Location: Left Arm, Patient Position: Sitting, Cuff Size: Normal)   Pulse (!) 59   Ht 6' (1.829 m)   Wt 182 lb (82.6 kg)   SpO2 96%   BMI 24.68 kg/m  Physical Exam      Exam    General- alert and comfortable   Lungs- clear without rales, wheezes    Sternal incision well-healed   Cor- regular rate and rhythm, no murmur , gallop   Abdomen- soft, non-tender   Extremities - warm, non-tender, minimal edema   Neuro- oriented, appropriate, no focal weakness   Diagnostic Tests:  None Impression: Excellent recovery after aVR CABG  Plan: Continue current medications Weight lifting restriction up to 25 pounds which will be released after 06/11/2017 Return as needed  Mikey BussingPeter Van Trigt III, MD [facility] [phone]

## 2017-07-22 DIAGNOSIS — M159 Polyosteoarthritis, unspecified: Secondary | ICD-10-CM | POA: Diagnosis not present

## 2017-07-22 DIAGNOSIS — I2510 Atherosclerotic heart disease of native coronary artery without angina pectoris: Secondary | ICD-10-CM | POA: Diagnosis not present

## 2017-07-22 DIAGNOSIS — Z954 Presence of other heart-valve replacement: Secondary | ICD-10-CM | POA: Diagnosis not present

## 2017-07-22 DIAGNOSIS — E782 Mixed hyperlipidemia: Secondary | ICD-10-CM | POA: Diagnosis not present

## 2017-08-20 DIAGNOSIS — I350 Nonrheumatic aortic (valve) stenosis: Secondary | ICD-10-CM | POA: Diagnosis not present

## 2017-08-20 DIAGNOSIS — Z951 Presence of aortocoronary bypass graft: Secondary | ICD-10-CM | POA: Diagnosis not present

## 2017-08-20 DIAGNOSIS — I359 Nonrheumatic aortic valve disorder, unspecified: Secondary | ICD-10-CM | POA: Diagnosis not present

## 2017-08-20 DIAGNOSIS — E785 Hyperlipidemia, unspecified: Secondary | ICD-10-CM | POA: Diagnosis not present

## 2017-08-20 DIAGNOSIS — Z952 Presence of prosthetic heart valve: Secondary | ICD-10-CM | POA: Diagnosis not present

## 2017-08-20 DIAGNOSIS — I2510 Atherosclerotic heart disease of native coronary artery without angina pectoris: Secondary | ICD-10-CM | POA: Diagnosis not present

## 2017-08-20 DIAGNOSIS — E663 Overweight: Secondary | ICD-10-CM | POA: Diagnosis not present

## 2017-12-09 DIAGNOSIS — E785 Hyperlipidemia, unspecified: Secondary | ICD-10-CM | POA: Diagnosis not present

## 2017-12-09 DIAGNOSIS — Z79899 Other long term (current) drug therapy: Secondary | ICD-10-CM | POA: Diagnosis not present

## 2017-12-09 DIAGNOSIS — G2581 Restless legs syndrome: Secondary | ICD-10-CM | POA: Diagnosis not present

## 2017-12-09 DIAGNOSIS — I2510 Atherosclerotic heart disease of native coronary artery without angina pectoris: Secondary | ICD-10-CM | POA: Diagnosis not present

## 2017-12-09 DIAGNOSIS — M159 Polyosteoarthritis, unspecified: Secondary | ICD-10-CM | POA: Diagnosis not present

## 2017-12-09 DIAGNOSIS — Z944 Liver transplant status: Secondary | ICD-10-CM | POA: Diagnosis not present

## 2017-12-16 DIAGNOSIS — N189 Chronic kidney disease, unspecified: Secondary | ICD-10-CM | POA: Insufficient documentation

## 2017-12-16 DIAGNOSIS — E782 Mixed hyperlipidemia: Secondary | ICD-10-CM | POA: Diagnosis not present

## 2017-12-16 DIAGNOSIS — G2581 Restless legs syndrome: Secondary | ICD-10-CM | POA: Diagnosis not present

## 2017-12-16 DIAGNOSIS — I2510 Atherosclerotic heart disease of native coronary artery without angina pectoris: Secondary | ICD-10-CM | POA: Diagnosis not present

## 2017-12-16 DIAGNOSIS — E039 Hypothyroidism, unspecified: Secondary | ICD-10-CM | POA: Diagnosis not present

## 2017-12-16 DIAGNOSIS — M159 Polyosteoarthritis, unspecified: Secondary | ICD-10-CM | POA: Diagnosis not present

## 2017-12-16 DIAGNOSIS — Z954 Presence of other heart-valve replacement: Secondary | ICD-10-CM | POA: Diagnosis not present

## 2018-01-05 IMAGING — DX DG CHEST 2V
2 series · 2 of 2 positions shown · non-contrast
Comparison: None.

CLINICAL DATA: Chest pain and tightness since last night. Mild
shortness of breath.

EXAM:
CHEST  2 VIEW

[chest pa]
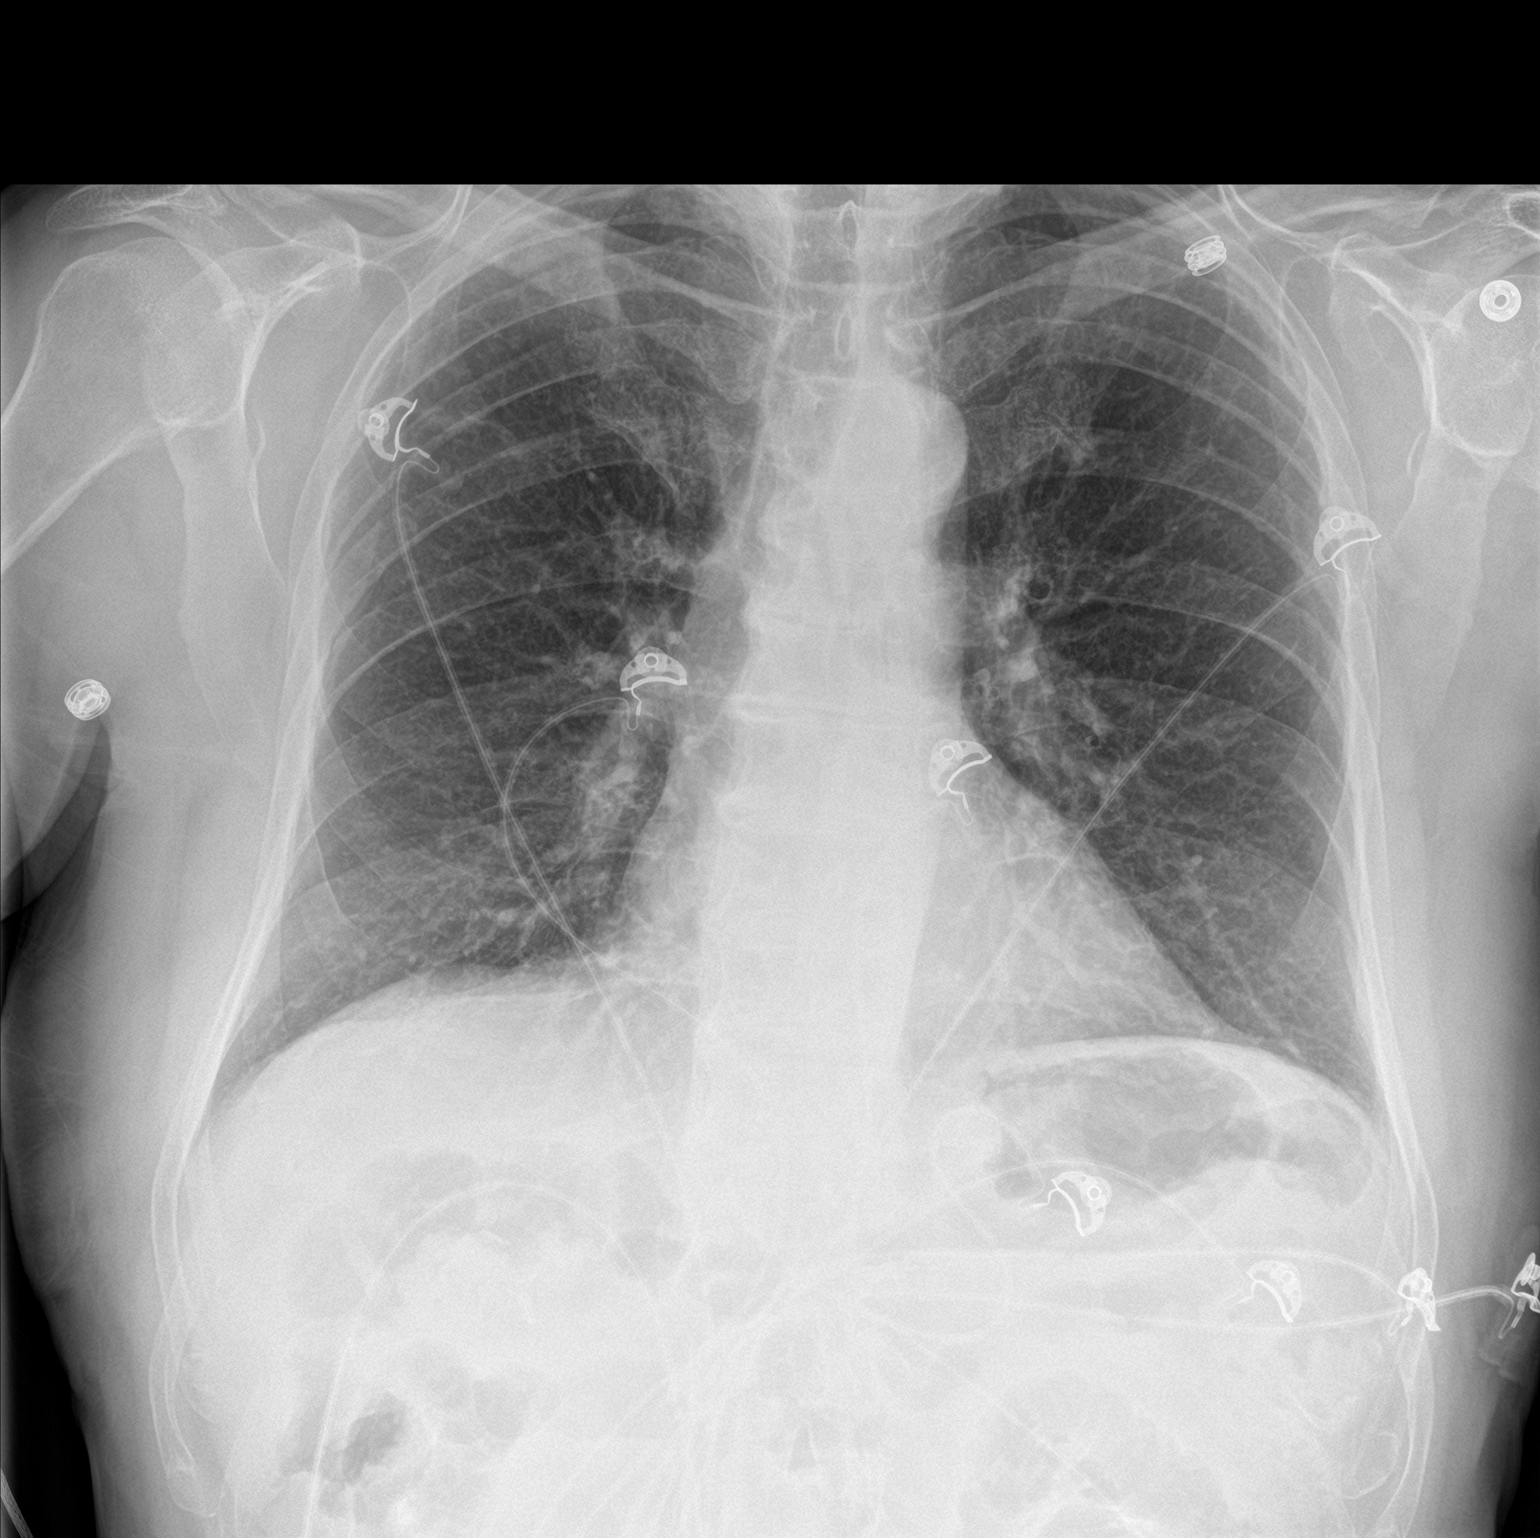

[chest lat]
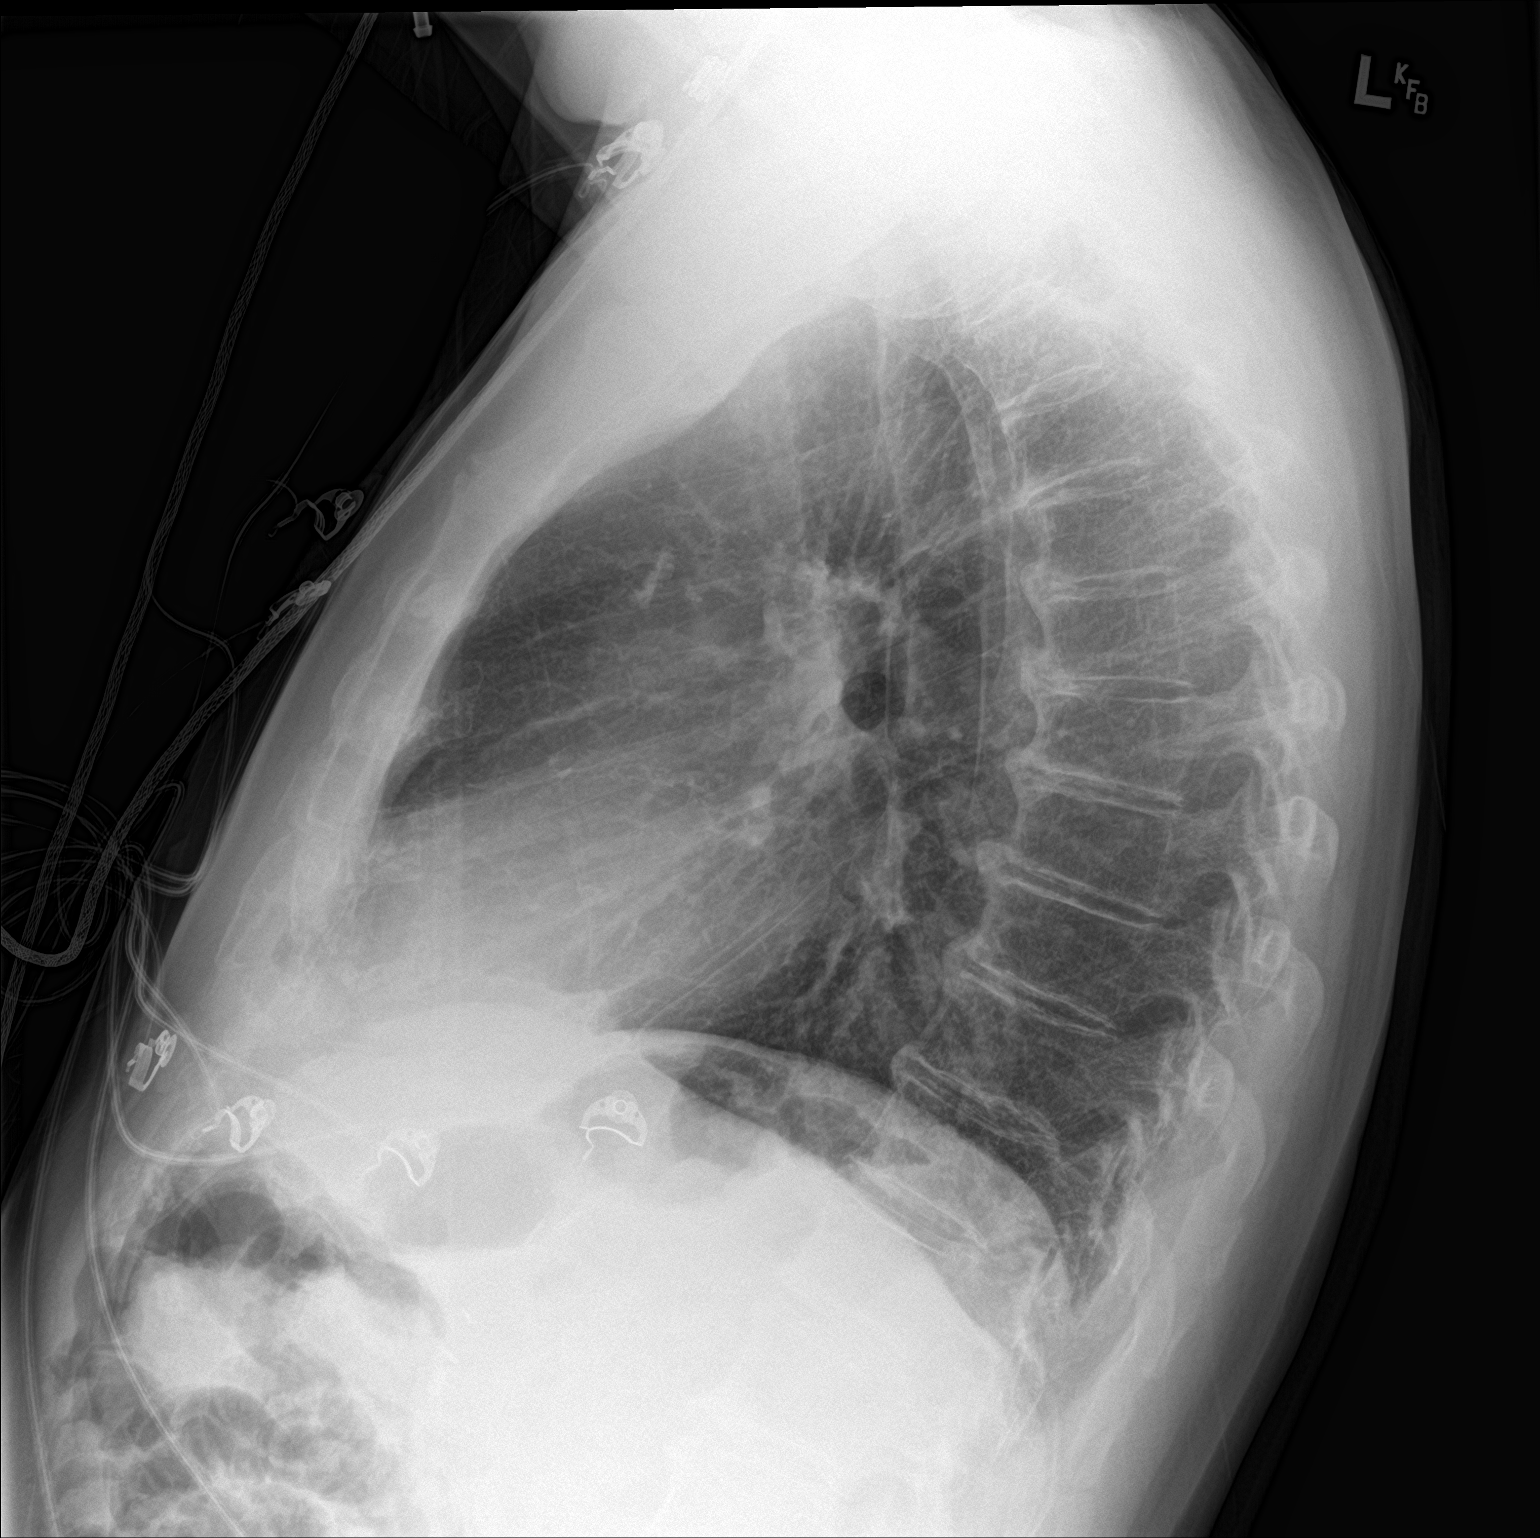

[2 of 2 positions shown; findings below may reference images not displayed]

FINDINGS: Normal sized heart. Clear lungs with normal vascularity. Mild
diffuse peribronchial thickening and accentuation of the
interstitial markings. Thoracic spine degenerative changes.
IMPRESSION: No acute abnormality.  Mild chronic bronchitic changes.

## 2018-01-28 DIAGNOSIS — E291 Testicular hypofunction: Secondary | ICD-10-CM | POA: Diagnosis not present

## 2018-02-20 IMAGING — DX DG CHEST 2V
2 series · 2 of 2 positions shown · non-contrast
Comparison: 02/04/2017

CLINICAL DATA: Status post coronary bypass grafting

EXAM:
CHEST  2 VIEW

[dg chest 2 view (1 of 2)]
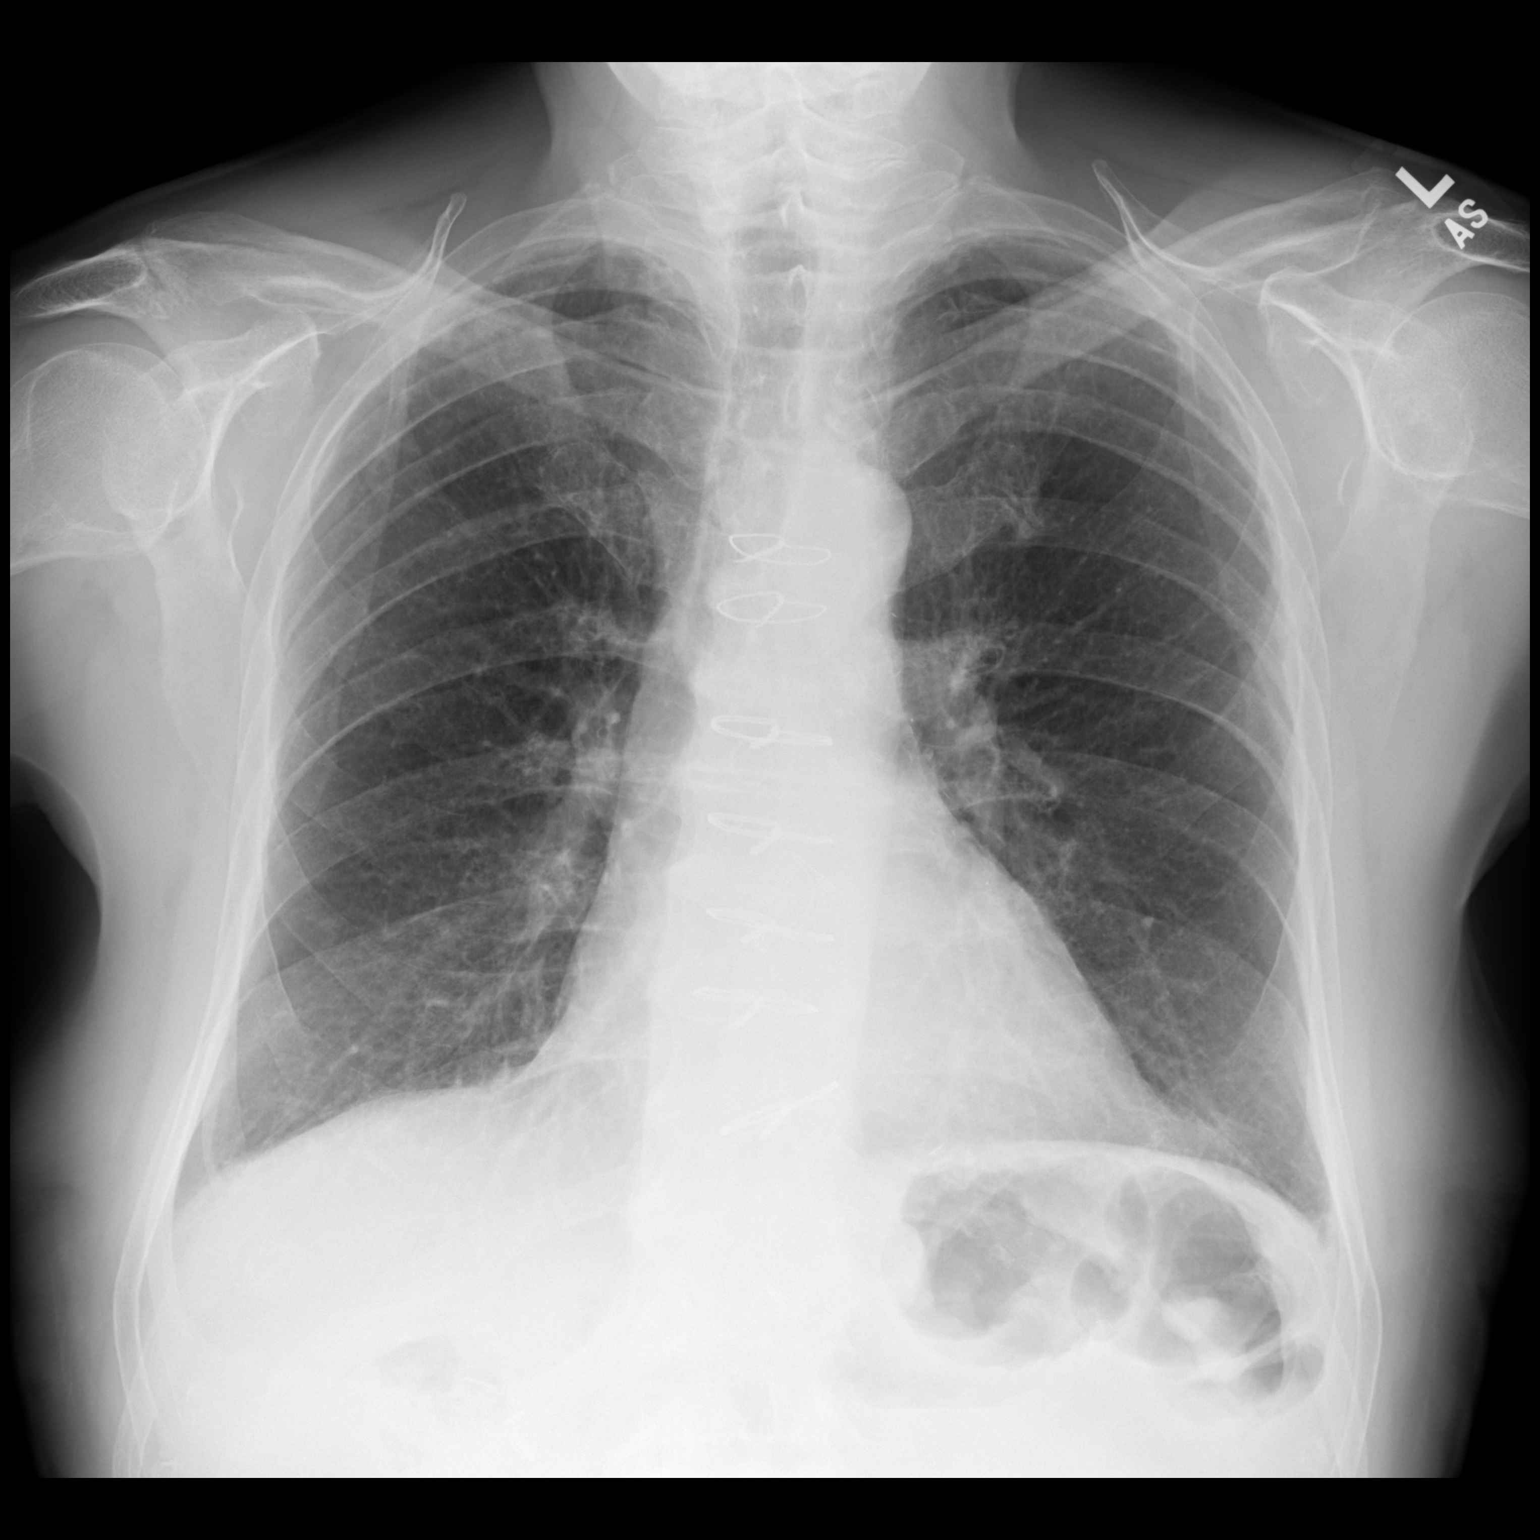

[dg chest 2 view (2 of 2)]
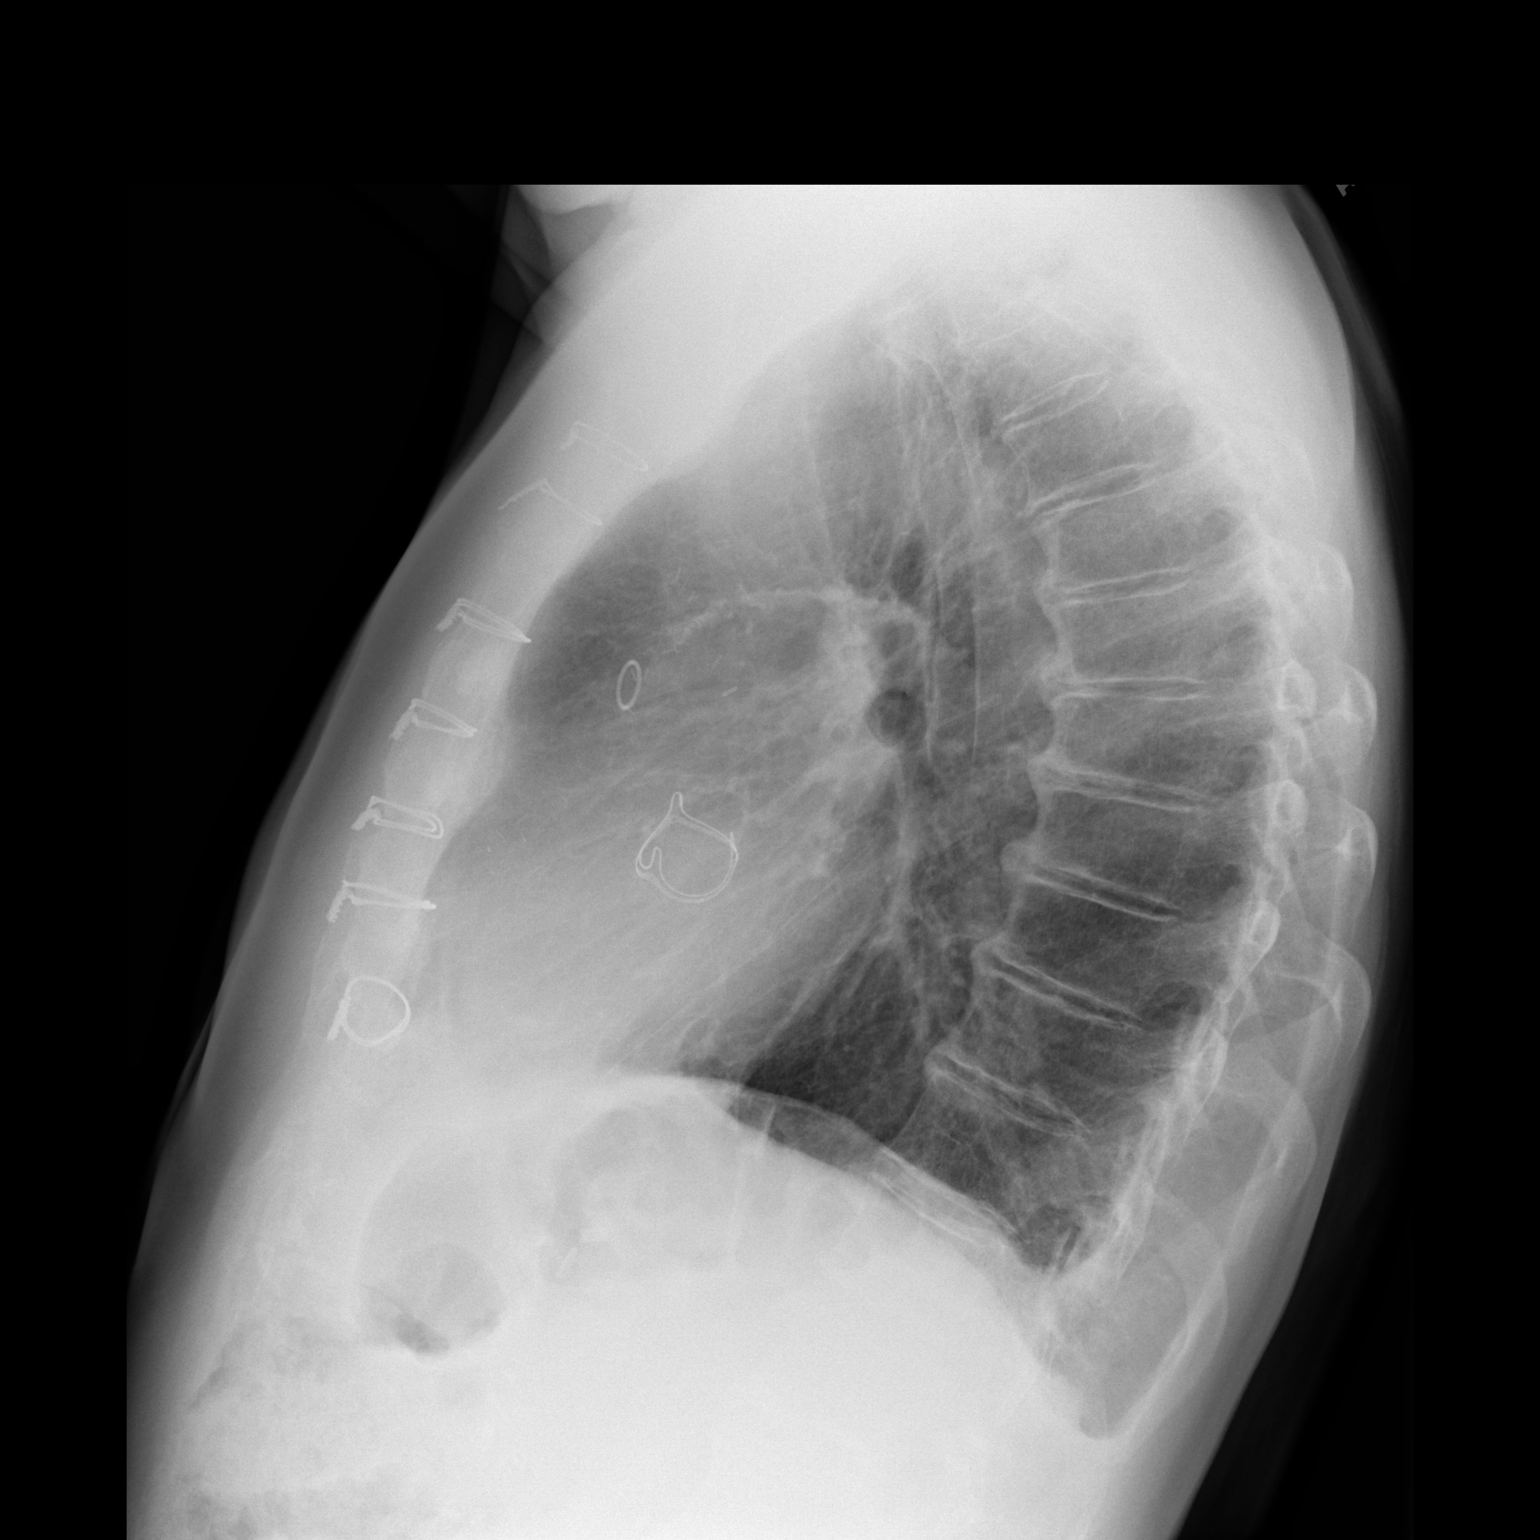

[2 of 2 positions shown; findings below may reference images not displayed]

FINDINGS: Cardiac shadow is stable. Postoperative changes are again seen. The
lungs are well aerated bilaterally. The previously seen right-sided
pneumothorax has resolved in the interval. No sizable effusion is
seen. No focal infiltrate is noted.
IMPRESSION: Postoperative change without acute abnormality.

Resolution of previously seen right-sided pneumothorax.

## 2018-02-25 DIAGNOSIS — I2510 Atherosclerotic heart disease of native coronary artery without angina pectoris: Secondary | ICD-10-CM | POA: Diagnosis not present

## 2018-02-25 DIAGNOSIS — Z951 Presence of aortocoronary bypass graft: Secondary | ICD-10-CM | POA: Diagnosis not present

## 2018-02-25 DIAGNOSIS — E785 Hyperlipidemia, unspecified: Secondary | ICD-10-CM | POA: Diagnosis not present

## 2018-02-25 DIAGNOSIS — Z952 Presence of prosthetic heart valve: Secondary | ICD-10-CM | POA: Diagnosis not present

## 2018-03-18 DIAGNOSIS — R69 Illness, unspecified: Secondary | ICD-10-CM | POA: Diagnosis not present

## 2018-03-21 DIAGNOSIS — D513 Other dietary vitamin B12 deficiency anemia: Secondary | ICD-10-CM | POA: Diagnosis not present

## 2018-03-21 DIAGNOSIS — E039 Hypothyroidism, unspecified: Secondary | ICD-10-CM | POA: Diagnosis not present

## 2018-03-24 DIAGNOSIS — N189 Chronic kidney disease, unspecified: Secondary | ICD-10-CM | POA: Diagnosis not present

## 2018-03-24 DIAGNOSIS — Z23 Encounter for immunization: Secondary | ICD-10-CM | POA: Diagnosis not present

## 2018-03-24 DIAGNOSIS — E782 Mixed hyperlipidemia: Secondary | ICD-10-CM | POA: Diagnosis not present

## 2018-03-24 DIAGNOSIS — N529 Male erectile dysfunction, unspecified: Secondary | ICD-10-CM | POA: Diagnosis not present

## 2018-03-24 DIAGNOSIS — I2510 Atherosclerotic heart disease of native coronary artery without angina pectoris: Secondary | ICD-10-CM | POA: Diagnosis not present

## 2018-03-24 DIAGNOSIS — M1712 Unilateral primary osteoarthritis, left knee: Secondary | ICD-10-CM | POA: Diagnosis not present

## 2018-03-24 DIAGNOSIS — M159 Polyosteoarthritis, unspecified: Secondary | ICD-10-CM | POA: Diagnosis not present

## 2018-04-07 DIAGNOSIS — H5210 Myopia, unspecified eye: Secondary | ICD-10-CM | POA: Diagnosis not present

## 2018-04-28 DIAGNOSIS — H2511 Age-related nuclear cataract, right eye: Secondary | ICD-10-CM | POA: Diagnosis not present

## 2018-04-28 DIAGNOSIS — H2513 Age-related nuclear cataract, bilateral: Secondary | ICD-10-CM | POA: Diagnosis not present

## 2018-05-06 ENCOUNTER — Other Ambulatory Visit: Payer: Self-pay | Admitting: Cardiology

## 2018-05-06 MED ORDER — ATORVASTATIN CALCIUM 20 MG PO TABS: ORAL_TABLET | ORAL | 0 refills | Status: DC

## 2018-05-06 NOTE — Telephone Encounter (Signed)
Refill sent in, okayed per Dr. Bing MatterKrasowski for Dr. Donnie Ahoilley

## 2018-05-06 NOTE — Telephone Encounter (Signed)
° ° °  1. Which medications need to be refilled? (please list name of each medication and dose if known) atorvastatin 20mg  tablet 1QD  2. Which pharmacy/location (including street and city if local pharmacy) is medication to be sent to? [facility] [address] [place]  3. Do they need a 30 day or 90 day supply? 90

## 2018-05-06 NOTE — Telephone Encounter (Signed)
Has been given to Dr. Krasowski for him to review  

## 2018-05-07 ENCOUNTER — Ambulatory Visit (INDEPENDENT_AMBULATORY_CARE_PROVIDER_SITE_OTHER): Payer: Medicare HMO | Admitting: Cardiology

## 2018-05-07 ENCOUNTER — Encounter: Payer: Self-pay | Admitting: Cardiology

## 2018-05-07 VITALS — BP 122/64 | HR 83 | Ht 72.0 in | Wt 187.8 lb

## 2018-05-07 DIAGNOSIS — E782 Mixed hyperlipidemia: Secondary | ICD-10-CM

## 2018-05-07 DIAGNOSIS — G2581 Restless legs syndrome: Secondary | ICD-10-CM | POA: Diagnosis not present

## 2018-05-07 DIAGNOSIS — I2510 Atherosclerotic heart disease of native coronary artery without angina pectoris: Secondary | ICD-10-CM

## 2018-05-07 DIAGNOSIS — Z951 Presence of aortocoronary bypass graft: Secondary | ICD-10-CM | POA: Insufficient documentation

## 2018-05-07 DIAGNOSIS — Z952 Presence of prosthetic heart valve: Secondary | ICD-10-CM

## 2018-05-07 NOTE — Patient Instructions (Signed)

## 2018-05-07 NOTE — Progress Notes (Signed)
Cardiology Office Note:    Date:  05/07/2018   ID:  Jordan Hammond, DOB 02/11/1941, MRN [id number]  PCP:  System, Pcp Not In  Cardiologist:  Gypsy Balsam, MD    Referring MD: No ref. provider found   Chief Complaint  Patient presents with  . Pre-op Exam  Doing very well  History of Present Illness:    Jordan Hammond is a 77 y.o. male with history of aortic valve replacement with bioprosthetic valve done in August 2018, coronary artery bypass graft done at the same session comes today to our office is scheduled to have cardiac surgery and need to have perioperative evaluation from the surgery.  Overall he is doing very well he goes to gym on the regular basis try to go 2-3 times a week he is able to walk close to 2 miles and with no difficulties no chest pain tightness squeezing pressure burning chest no palpitations no dizziness no swelling of lower extremities.  He has been taking care of his wife who had Parkinson however she did not dramatically better and is looking forward forward to do more activity with her which probably will include dancing and swimming.  Past Medical History:  Diagnosis Date  . Aortic atherosclerosis (HCC) 01/20/2017  . Aortic stenosis 01/20/2017  . CAD (coronary artery disease), native coronary artery 01/20/2017  . GERD (gastroesophageal reflux disease)   . Hiatal hernia   . Hyperlipidemia   . Kidney stones   . Restless leg syndrome     Past Surgical History:  Procedure Laterality Date  . AORTIC VALVE REPLACEMENT N/A 01/29/2017   Procedure: AORTIC VALVE REPLACEMENT (AVR);  Surgeon: Donata Clay, Theron Arista, MD;  Location: [facility] OR;  Service: Open Heart Surgery;  Laterality: N/A;  . APPENDECTOMY    . bowel obstruction surgery    . CHOLECYSTECTOMY    . CORONARY ARTERY BYPASS GRAFT N/A 01/29/2017   Procedure: CORONARY ARTERY BYPASS GRAFTING (CABG)  x two, using left internal mammary artery and right leg greater saphenous vein harvested endoscopically;  Surgeon:  Kerin Perna, MD;  Location: [facility];  Service: Open Heart Surgery;  Laterality: N/A;  . KNEE ARTHROSCOPY    . lap nissan    . LEFT HEART CATH AND CORONARY ANGIOGRAPHY N/A 01/21/2017   Procedure: LEFT HEART CATH AND CORONARY ANGIOGRAPHY;  Surgeon: Runell Gess, MD;  Location: [facility] INVASIVE CV LAB;  Service: Cardiovascular;  Laterality: N/A;  . RIGHT HEART CATH N/A 01/21/2017   Procedure: RIGHT HEART CATH;  Surgeon: Runell Gess, MD;  [facility] INVAS[facility] CV LAB;  Service: Cardiovascular;  Laterality: N/A;  . SHOULDER SURGERY    . TEE WITHOUT CARDIOVERSION N/A 01/29/2017   Procedure: TRANSESOPHAGEAL ECHOCARDIOGRAM (TEE);  Surgeon: Donata Clay, Theron Arista, MD;  Location: [facility] OR;  Service: Open Heart Surgery;  Laterality: N/A;    Current Medications: Current Meds  Medication Sig  . acetaminophen (TYLENOL) 500 MG tablet Take 1,000 mg by mouth every 6 (six) hours as needed for headache.  Marland Kitchen aspirin EC 81 MG EC tablet Take 1 tablet (81 mg total) by mouth daily.  Marland Kitchen atorvastatin (LIPITOR) 20 MG tablet TAKE 1 TABLET BY MOUTH DAILY  . calcium citrate-vitamin D (CALCIUM + D) 315-200 MG-UNIT tablet Take 1 tablet by mouth daily.  . Cholecalciferol (VITAMIN D3) 5000 units CAPS Take 5,000 Units by mouth daily.  . clonazePAM (KLONOPIN) 1 MG tablet Take 1 mg by mouth at bedtime.  . Flaxseed Oil OIL Take 1,000 mg by  mouth daily.  Marland Kitchen gabapentin (NEURONTIN) 300 MG capsule Take 300 mg by mouth daily.  . metoprolol tartrate (LOPRESSOR) 25 MG tablet TAKE 1 TABLET BY MOUTH TWICE DAILY     Allergies:   Aspirin; Erythromycin; Fentanyl; Ibuprofen; Nabumetone; Nsaids; Sulfa antibiotics; Tetracycline; Sulfamethoxazole; and Sulfur   Social History   Socioeconomic History  . Marital status: Married    Spouse name: Not on file  . Number of children: Not on file  . Years of education: Not on file  . Highest education level: Not on file  Occupational History  . Not on file  Social Needs  . Financial resource  strain: Not on file  . Food insecurity:    Worry: Not on file    Inability: Not on file  . Transportation needs:    Medical: Not on file    Non-medical: Not on file  Tobacco Use  . Smoking status: Never Smoker  . Smokeless tobacco: Never Used  Substance and Sexual Activity  . Alcohol use: No  . Drug use: No  . Sexual activity: Yes  Lifestyle  . Physical activity:    Days per week: Not on file    Minutes per session: Not on file  . Stress: Not on file  Relationships  . Social connections:    Talks on phone: Not on file    Gets together: Not on file    Attends religious service: Not on file    Active member of club or organization: Not on file    Attends meetings of clubs or organizations: Not on file    Relationship status: Not on file  Other Topics Concern  . Not on file  Social History Narrative   Divorced, remarried.  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 ROS:   Please see the history of present illness.    All 14 point review of systems negative except as described per history of present illness  EKGs/Labs/Other Studies Reviewed:      Recent Labs: No results found for requested labs within last 8760 hours.  Recent Lipid Panel    Component Value Date/Time   CHOL 169 01/20/2017 1252   TRIG 92 01/20/2017 1252   HDL 48 01/20/2017 1252   CHOLHDL 3.5 01/20/2017 1252   VLDL 18 01/20/2017 1252   LDLCALC 103 (H) 01/20/2017 1252    Physical Exam:    VS:  BP 122/64   Pulse 83   Ht 6' (1.829 m)   Wt 187 lb 12.8 oz (85.2 kg)   SpO2 98%   BMI 25.47 kg/m     Wt Readings from Last 3 Encounters:  05/07/18 187 lb 12.8 oz (85.2 kg)  05/08/17 182 lb (82.6 kg)  03/06/17 176 lb 9.6 oz (80.1 kg)     GEN:  Well nourished, well developed in no acute distress HEENT: Normal NECK: No JVD; No carotid bruits LYMPHATICS: No  lymphadenopathy CARDIAC: RRR, no murmurs, no rubs, no gallops RESPIRATORY:  Clear to auscultation without rales, wheezing or rhonchi  ABDOMEN: Soft, non-tender, non-distended MUSCULOSKELETAL:  No edema; No deformity  SKIN: Warm and dry LOWER EXTREMITIES: no swelling NEUROLOGIC:  Alert and oriented x 3 PSYCHIATRIC:  Normal affect   ASSESSMENT:    1. S/P AVR (aortic valve replacement)   2. Coronary artery disease involving native coronary artery of native heart without angina pectoris  3. Mixed hyperlipidemia   4. Restless leg syndrome   5. Status post coronary artery bypass graft    PLAN:    In order of problems listed above:  1. Status post aortic valve replacement with a bypass of the valve sounds beautiful there is no murmur.  Next 6 months we will repeat echocardiogram. 2. Coronary artery disease status post coronary artery bypass graft asymptomatic active doing well. 3. Mixed dyslipidemia last fasting lipid profile was good we will continue present management. 4. Restless leg syndrome takes a medication for it with good relief.  Overall cardiac wise he is stable to have cardiac surgery with no restrictions.  I encouraged him to keep exercising we talked about healthy lifestyle he will be back in our office 5 6 months.   Medication Adjustments/Labs and Tests Ordered: Current medicines are reviewed at length with the patient today.  Concerns regarding medicines are outlined above.  No orders of the defined types were placed in this encounter.  Medication changes: No orders of the defined types were placed in this encounter.   Signed, Georgeanna Leaobert J. Zissel Biederman, MD, [facility] 05/07/2018 9:11 AM    [facility]

## 2018-05-15 DIAGNOSIS — R69 Illness, unspecified: Secondary | ICD-10-CM | POA: Diagnosis not present

## 2018-05-22 DIAGNOSIS — H2511 Age-related nuclear cataract, right eye: Secondary | ICD-10-CM | POA: Diagnosis not present

## 2018-05-22 DIAGNOSIS — Z961 Presence of intraocular lens: Secondary | ICD-10-CM | POA: Diagnosis not present

## 2018-05-23 DIAGNOSIS — H2512 Age-related nuclear cataract, left eye: Secondary | ICD-10-CM | POA: Diagnosis not present

## 2018-06-12 DIAGNOSIS — M545 Low back pain, unspecified: Secondary | ICD-10-CM | POA: Insufficient documentation

## 2018-06-12 DIAGNOSIS — R011 Cardiac murmur, unspecified: Secondary | ICD-10-CM | POA: Insufficient documentation

## 2018-06-12 DIAGNOSIS — Z961 Presence of intraocular lens: Secondary | ICD-10-CM | POA: Diagnosis not present

## 2018-06-12 DIAGNOSIS — H2512 Age-related nuclear cataract, left eye: Secondary | ICD-10-CM | POA: Diagnosis not present

## 2018-06-18 ENCOUNTER — Ambulatory Visit: Payer: Self-pay | Admitting: Cardiology

## 2018-08-05 ENCOUNTER — Other Ambulatory Visit: Payer: Self-pay | Admitting: *Deleted

## 2018-08-05 MED ORDER — ATORVASTATIN CALCIUM 20 MG PO TABS: ORAL_TABLET | ORAL | 1 refills | Status: DC

## 2018-10-06 ENCOUNTER — Telehealth: Payer: Self-pay | Admitting: *Deleted

## 2018-10-06 NOTE — Telephone Encounter (Signed)
YOUR CARDIOLOGY TEAM HAS ARRANGED FOR AN E-VISIT FOR YOUR APPOINTMENT - PLEASE REVIEW IMPORTANT INFORMATION BELOW SEVERAL DAYS PRIOR TO YOUR APPOINTMENT  Due to the recent COVID-19 pandemic, we are transitioning in-person office visits to tele-medicine visits in an effort to decrease unnecessary exposure to our patients, their families, and staff. These visits are billed to your insurance just like a normal visit is. We also encourage you to sign up for MyChart if you have not already done so. You will need a smartphone if possible. For patients that do not have this, we can still complete the visit using a regular telephone but do prefer a smartphone to enable video when possible. You may have a family member that lives with you that can help. If possible, we also ask that you have a blood pressure cuff and scale at home to measure your blood pressure, heart rate and weight prior to your scheduled appointment. Patients with clinical needs that need an in-person evaluation and testing will still be able to come to the office if absolutely necessary. If you have any questions, feel free to call our office.     YOUR PROVIDER WILL BE USING THE FOLLOWING PLATFORM TO COMPLETE YOUR VISIT: [email or web address]  . IF USING MYCHART - How to Download the MyChart App to Your SmartPhone   - If Apple, go to [employer] and type in MyChart in the search bar and download the app. If Android, ask patient to go to [employer] and type in [place] in the search bar and download the app. The app is free but as with any other app downloads, your phone may require you to verify saved payment information or Apple/Android password.  - You will need to then log into the app with your MyChart username and password, and select [facility] as your healthcare provider to link the account.  - When it is time for your visit, go to the MyChart app, find appointments, and click Begin Video Visit. Be sure to Select Allow for your device to  access the Microphone and Camera for your visit. You will then be connected, and your provider will be with you shortly.  **If you have any issues connecting or need assistance, please contact MyChart service desk [phone])**  **If using a computer, in order to ensure the best quality for your visit, you will need to use either of the following Internet Browsers: Insurance underwriter or [employer]**  . IF USING DOXIMITY or [email or web address].ME - The staff will give you instructions on receiving your link to join the meeting the day of your visit.      2-3 DAYS BEFORE YOUR APPOINTMENT  You will receive a telephone call from one of our [facility] team members - your caller ID may say "Unknown caller." If this is a video visit, we will walk you through how to get the video launched on your phone. We will remind you check your blood pressure, heart rate and weight prior to your scheduled appointment. If you have an Apple Watch or Kardia, please upload any pertinent ECG strips the day before or morning of your appointment to [facility]. Our staff will also make sure you have reviewed the consent and agree to move forward with your scheduled tele-health visit.     THE DAY OF YOUR APPOINTMENT  Approximately 15 minutes prior to your scheduled appointment, you will receive a telephone call from one of [facility] team - your caller ID may say "Unknown caller."  Our staff will confirm medications, vital signs for the day and any symptoms you may be experiencing. Please have this information available prior to the time of visit start. It may also be helpful for you to have a pad of paper and pen handy for any instructions given during your visit. They will also walk you through joining the smartphone meeting if this is a video visit.    CONSENT FOR TELE-HEALTH VISIT - PLEASE REVIEW  I hereby voluntarily request, consent and authorize [facility] and its employed or contracted physicians, physician  assistants, nurse practitioners or other licensed health care professionals (the Practitioner), to provide me with telemedicine health care services (the "Services") as deemed necessary by the treating Practitioner. I acknowledge and consent to receive the Services by the Practitioner via telemedicine. I understand that the telemedicine visit will involve communicating with the Practitioner through live audiovisual communication technology and the disclosure of certain medical information by electronic transmission. I acknowledge that I have been given the opportunity to request an in-person assessment or other available alternative prior to the telemedicine visit and am voluntarily participating in the telemedicine visit.  I understand that I have the right to withhold or withdraw my consent to the use of telemedicine in the course of my care at any time, without affecting my right to future care or treatment, and that the Practitioner or I may terminate the telemedicine visit at any time. I understand that I have the right to inspect all information obtained and/or recorded in the course of the telemedicine visit and may receive copies of available information for a reasonable fee.  I understand that some of the potential risks of receiving the Services via telemedicine include:  Marland Kitchen. Delay or interruption in medical evaluation due to technological equipment failure or disruption; . Information transmitted may not be sufficient (e.g. poor resolution of images) to allow for appropriate medical decision making by the Practitioner; and/or  . In rare instances, security protocols could fail, causing a breach of personal health information.  Furthermore, I acknowledge that it is my responsibility to provide information about my medical history, conditions and care that is complete and accurate to the best of my ability. I acknowledge that Practitioner's advice, recommendations, and/or decision may be based on  factors not within their control, such as incomplete or inaccurate data provided by me or distortions of diagnostic images or specimens that may result from electronic transmissions. I understand that the practice of medicine is not an exact science and that Practitioner makes no warranties or guarantees regarding treatment outcomes. I acknowledge that I will receive a copy of this consent concurrently upon execution via email to the email address I last provided but may also request a printed copy by calling the office of [facility].    I understand that my insurance will be billed for this visit.   I have read or had this consent read to me. . I understand the contents of this consent, which adequately explains the benefits and risks of the Services being provided via telemedicine.  . I have been provided ample opportunity to ask questions regarding this consent and the Services and have had my questions answered to my satisfaction. . I give my informed consent for the services to be provided through the use of telemedicine in my medical care  By participating in this telemedicine visit I agree to the above.  Patient's wife, Steward DroneBrenda, per DPR has verbally consent to virtual visit on Wednesday, 10/08/2018,  at 1:30 pm.

## 2018-10-07 ENCOUNTER — Ambulatory Visit: Payer: Self-pay | Admitting: Cardiology

## 2018-10-07 NOTE — Progress Notes (Signed)
Virtual Visit via Video Note   This visit type was conducted due to national recommendations for restrictions regarding the COVID-19 Pandemic (e.g. social distancing) in an effort to limit this patient's exposure and mitigate transmission in our community.  Due to his co-morbid illnesses, this patient is at least at moderate risk for complications without adequate follow up.  This format is felt to be most appropriate for this patient at this time.  All issues noted in this document were discussed and addressed.  A limited physical exam was performed with this format.  Please refer to the patient's chart for his consent to telehealth for [facility].   Evaluation Performed:  Follow-up visit  Date:  10/08/2018   ID:  Jordan Hammond 1941/03/29, MRN [id number]  Patient Location: Home Provider Location: Home  PCP:  System, Pcp Not In  Cardiologist:  No primary care provider on file. [facility] Electrophysiologist:  None   Chief Complaint:  FU after CABG and SAVR August 201  History of Present Illness:    Jordan Hammond is a 78 y.o. male with SAVR and CABG 01/30/2017 last seen 05/08/18.   OPERATIONS: 1. Coronary artery bypass grafting x2 (left internal mammary artery to     left anterior descending, saphenous vein graft to obtuse marginal). 2. 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  The patient does not have symptoms concerning for COVID-19 infection (fever, chills, cough, or new shortness of breath).   He has very good healthcare literacy practices social distancing wears a mask and good handwashing technique.  He is less active and is committed to getting back to his regular walking program with a mask.  He has had no angina dyspnea syncope palpitation or shortness of breath.  In the next few weeks he will follow-up labs in his PCP office and I asked him to have a copy sent to me.   Past Medical History:  Diagnosis  Date  . Aortic atherosclerosis (HCC) 01/20/2017  . Aortic stenosis 01/20/2017  . CAD (coronary artery disease), native coronary artery 01/20/2017  . GERD (gastroesophageal reflux disease)   . Hiatal hernia   . Hyperlipidemia   . Kidney stones   . Restless leg syndrome    Past Surgical History:  Procedure Laterality Date  . AORTIC VALVE REPLACEMENT N/A 01/29/2017   Procedure: AORTIC VALVE REPLACEMENT (AVR);  Surgeon: Donata Clay, Theron Arista, MD;  Location: [facility] OR;  Service: Open Heart Surgery;  Laterality: N/A;  . APPENDECTOMY    . bowel obstruction surgery    . CHOLECYSTECTOMY    . CORONARY ARTERY BYPASS GRAFT N/A 01/29/2017   Procedure: CORONARY ARTERY BYPASS GRAFTING (CABG)  x two, using left internal mammary artery and right leg greater saphenous vein harvested endoscopically;  Surgeon: Kerin Perna, MD;  Location: [facility] OR;  Service: Open Heart Surgery;  Laterality: N/A;  . KNEE ARTHROSCOPY    . lap nissan    . LEFT HEART CATH AND CORONARY ANGIOGRAPHY N/A 01/21/2017   Procedure: LEFT HEART CATH AND CORONARY ANGIOGRAPHY;  Surgeon: Runell Gess, MD;  Location: [facility] INVASIVE CV LAB;  Service: Cardiovascular;  Laterality: N/A;  . RIGHT HEART CATH N/A 01/21/2017   Procedure: RIGHT HEART CATH;  Surgeon: Runell Gess, MD;  Location: [facility] INVASIVE CV LAB;  Service: Cardiovascular;  Laterality: N/A;  . SHOULDER SURGERY    . TEE WITHOUT CARDIOVERSION N/A 01/29/2017   Procedure: TRANSESOPHAGEAL ECHOCARDIOGRAM (TEE);  Surgeon: Donata Clay, Theron Arista, MD;  Location: [facility] OR;  Service: Open Heart Surgery;  Laterality: N/A;     Current Meds  Medication Sig  . acetaminophen (TYLENOL) 500 MG tablet Take 1,000 mg by mouth every 6 (six) hours as needed for headache.  Marland Kitchen aspirin EC 81 MG EC tablet Take 1 tablet (81 mg total) by mouth daily.  Marland Kitchen atorvastatin (LIPITOR) 20 MG tablet TAKE 1 TABLET BY MOUTH DAILY  . Bisacodyl (DULCOLAX PO) Take 2 tablets by mouth daily as needed.  . calcium citrate-vitamin D (CALCIUM +  D) 315-200 MG-UNIT tablet Take 1 tablet by mouth daily.  . Cholecalciferol (VITAMIN D3) 5000 units CAPS Take 5,000 Units by mouth daily.  . clonazePAM (KLONOPIN) 1 MG tablet Take 1 mg by mouth at bedtime.  . diphenhydrAMINE (BENADRYL) 25 MG tablet Take 25 mg by mouth at bedtime as needed for itching.  . Flaxseed Oil OIL Take 1,000 mg by mouth daily.  Marland Kitchen gabapentin (NEURONTIN) 300 MG capsule Take 300 mg by mouth daily.  Marland Kitchen levothyroxine (SYNTHROID) 50 MCG tablet Take 50 mcg by mouth daily before breakfast.  . Magnesium Hydroxide (MILK OF MAGNESIA PO) Take 1 tablet by mouth daily as needed.  . metoprolol tartrate (LOPRESSOR) 25 MG tablet TAKE 1 TABLET BY MOUTH TWICE DAILY  . pantoprazole (PROTONIX) 40 MG tablet Take 1 tablet by mouth 2 (two) times a day.  . vitamin B-12 (CYANOCOBALAMIN) 1000 MCG tablet Take 1,000 mcg by mouth daily.     Allergies:   Aspirin; Erythromycin; Fentanyl; Ibuprofen; Nabumetone; Nsaids; Sulfa antibiotics; Tetracycline; Sulfamethoxazole; and Sulfur   Social History   Tobacco Use  . Smoking status: Never Smoker  . Smokeless tobacco: Never Used  Substance Use Topics  . Alcohol use: No  . Drug use: No     Family Hx: The patient's family history includes CAD in his father; Dementia in his mother.  ROS:   Please see the history of present illness.     All other systems reviewed and are negative.   Prior CV studies:   The following studies were reviewed today:    Labs/Other Tests and Data Reviewed:    EKG:  No ECG reviewed.  Recent Labs: 12/19/17 Chol 136 HDL 57 LDL 136 TG 63  Recent Lipid Panel Lab Results  Component Value Date/Time   CHOL 169 01/20/2017 12:52 PM   TRIG 92 01/20/2017 12:52 PM   HDL 48 01/20/2017 12:52 PM   CHOLHDL 3.5 01/20/2017 12:52 PM   LDLCALC 103 (H) 01/20/2017 12:52 PM    Wt Readings from Last 3 Encounters:  10/08/18 188 lb (85.3 kg)  05/07/18 187 lb 12.8 oz (85.2 kg)  05/08/17 182 lb (82.6 kg)     Objective:     Vital Signs:  BP (!) 177/73 (BP Location: Left Arm, Patient Position: Sitting)   Pulse 77   Ht 6' (1.829 m)   Wt 188 lb (85.3 kg)   BMI 25.50 kg/m    VITAL SIGNS:  reviewed GEN:  no acute distress EYES:  sclerae anicteric, EOMI - Extraocular Movements Intact RESPIRATORY:  normal respiratory effort, symmetric expansion CARDIOVASCULAR:  no peripheral edema SKIN:  no rash, lesions or ulcers. MUSCULOSKELETAL:  no obvious deformities. NEURO:  alert and oriented x 3, no obvious focal deficit PSYCH:  normal affect  ASSESSMENT & PLAN:    1. AVR surgical bioprosthetic stable I requested a copy of his postoperative echo October 2018 and will plan to do a follow-up August 2023 in  the absence of signs and symptoms of valve dysfunction. 2. CAD status post CABG stable asymptomatic New [place] Heart Association class I having no angina after revascularization and current medical therapy including aspirin statin 3. Hyperlipidemia stable continue statin access most recent lipid profile and await pending labs in the next few months of an opportunity to meet with him in the office in 6 months and review them 4. Hypothyroidism stable continue his current supplement  COVID-19 Education: The signs and symptoms of COVID-19 were discussed with the patient and how to seek care for testing (follow up with PCP or arrange E-visit).  The importance of social distancing was discussed today.  Time:   Today, I have spent 25 minutes with the patient with telehealth technology discussing the above problems.     Medication Adjustments/Labs and Tests Ordered: Current medicines are reviewed at length with the patient today.  Concerns regarding medicines are outlined above.   Tests Ordered: No orders of the defined types were placed in this encounter.   Medication Changes: No orders of the defined types were placed in this encounter.   Disposition:  Follow up in 6 month(s)  Signed, Norman HerrlichBrian Lejend Dalby, MD  10/08/2018  2:00 PM    [facility]

## 2018-10-08 ENCOUNTER — Encounter: Payer: Self-pay | Admitting: *Deleted

## 2018-10-08 ENCOUNTER — Encounter: Payer: Self-pay | Admitting: Cardiology

## 2018-10-08 ENCOUNTER — Other Ambulatory Visit: Payer: Self-pay

## 2018-10-08 ENCOUNTER — Telehealth (INDEPENDENT_AMBULATORY_CARE_PROVIDER_SITE_OTHER): Payer: Medicare HMO | Admitting: Cardiology

## 2018-10-08 VITALS — BP 177/73 | HR 77 | Ht 72.0 in | Wt 188.0 lb

## 2018-10-08 DIAGNOSIS — I25118 Atherosclerotic heart disease of native coronary artery with other forms of angina pectoris: Secondary | ICD-10-CM

## 2018-10-08 DIAGNOSIS — Z952 Presence of prosthetic heart valve: Secondary | ICD-10-CM | POA: Diagnosis not present

## 2018-10-08 DIAGNOSIS — E782 Mixed hyperlipidemia: Secondary | ICD-10-CM

## 2018-10-08 MED ORDER — ATORVASTATIN CALCIUM 20 MG PO TABS: ORAL_TABLET | ORAL | 1 refills | Status: DC

## 2018-10-08 NOTE — Patient Instructions (Signed)
Medication Instructions:  Your physician recommends that you continue on your current medications as directed. Please refer to the Current Medication list given to you today.  If you need a refill on your cardiac medications before your next appointment, please call your pharmacy.   Lab work: None  If you have labs (blood work) drawn today and your tests are completely normal, you will receive your results only by: Marland Kitchen MyChart Message (if you have MyChart) OR . A paper copy in the mail If you have any lab test that is abnormal or we need to change your treatment, we will call you to review the results.  Testing/Procedures: None  Follow-Up: At [facility], you and your health needs are our priority.  As part of our continuing mission to provide you with exceptional heart care, we have created designated Provider Care Teams.  These Care Teams include your primary Cardiologist (physician) and Advanced Practice Providers (APPs -  Physician Assistants and Nurse Practitioners) who all work together to provide you with the care you need, when you need it. You will need a follow up appointment in 6 months: Monday, 04/06/2019, at 2:00 pm in the [facility] office. Please arrive at 1:45 pm.

## 2018-10-08 NOTE — Addendum Note (Signed)
Addended by: Crist Fat on: 10/08/2018 02:24 PM   Modules accepted: Orders

## 2019-01-08 DIAGNOSIS — E039 Hypothyroidism, unspecified: Secondary | ICD-10-CM | POA: Diagnosis not present

## 2019-01-08 DIAGNOSIS — R350 Frequency of micturition: Secondary | ICD-10-CM | POA: Diagnosis not present

## 2019-01-08 DIAGNOSIS — G2581 Restless legs syndrome: Secondary | ICD-10-CM | POA: Diagnosis not present

## 2019-01-08 DIAGNOSIS — I2510 Atherosclerotic heart disease of native coronary artery without angina pectoris: Secondary | ICD-10-CM | POA: Diagnosis not present

## 2019-01-08 DIAGNOSIS — E859 Amyloidosis, unspecified: Secondary | ICD-10-CM | POA: Diagnosis not present

## 2019-01-08 DIAGNOSIS — M159 Polyosteoarthritis, unspecified: Secondary | ICD-10-CM | POA: Diagnosis not present

## 2019-01-08 DIAGNOSIS — E538 Deficiency of other specified B group vitamins: Secondary | ICD-10-CM | POA: Diagnosis not present

## 2019-01-08 DIAGNOSIS — N189 Chronic kidney disease, unspecified: Secondary | ICD-10-CM | POA: Diagnosis not present

## 2019-01-08 DIAGNOSIS — N400 Enlarged prostate without lower urinary tract symptoms: Secondary | ICD-10-CM | POA: Diagnosis not present

## 2019-01-08 DIAGNOSIS — E782 Mixed hyperlipidemia: Secondary | ICD-10-CM | POA: Diagnosis not present

## 2019-01-12 ENCOUNTER — Other Ambulatory Visit: Payer: Self-pay | Admitting: Cardiology

## 2019-02-13 ENCOUNTER — Telehealth: Payer: Self-pay | Admitting: Cardiology

## 2019-02-13 ENCOUNTER — Other Ambulatory Visit: Payer: Self-pay | Admitting: *Deleted

## 2019-02-13 MED ORDER — ATORVASTATIN CALCIUM 20 MG PO TABS: 20.0000 mg | ORAL_TABLET | Freq: Every day | ORAL | 1 refills | Status: DC

## 2019-02-13 NOTE — Telephone Encounter (Signed)
°*  STAT* If patient is at the pharmacy, call can be transferred to refill team.   1. Which medications need to be refilled? (please list name of each medication and dose if known) atorvastatin (LIPITOR) 20 MG tablet    2. Which pharmacy/location (including street and city if local pharmacy) is medication to be sent to? [facility] [address] [employer] [facility]. Do they need a 30 day or 90 day supply?

## 2019-02-13 NOTE — Telephone Encounter (Signed)
Refill sent to [facility] [address] [place] per request

## 2019-03-26 DIAGNOSIS — Z23 Encounter for immunization: Secondary | ICD-10-CM | POA: Diagnosis not present

## 2019-04-06 ENCOUNTER — Telehealth (INDEPENDENT_AMBULATORY_CARE_PROVIDER_SITE_OTHER): Payer: Medicare HMO | Admitting: Cardiology

## 2019-04-06 ENCOUNTER — Encounter: Payer: Self-pay | Admitting: Cardiology

## 2019-04-06 VITALS — Ht 72.0 in

## 2019-04-06 DIAGNOSIS — Z952 Presence of prosthetic heart valve: Secondary | ICD-10-CM | POA: Diagnosis not present

## 2019-04-06 DIAGNOSIS — I2510 Atherosclerotic heart disease of native coronary artery without angina pectoris: Secondary | ICD-10-CM

## 2019-04-06 DIAGNOSIS — I10 Essential (primary) hypertension: Secondary | ICD-10-CM | POA: Diagnosis not present

## 2019-04-06 NOTE — Progress Notes (Signed)
Virtual Visit via Video Note:  This visit type was conducted due to national recommendations for restrictions regarding the COVID-19 Pandemic (e.g. social distancing) in an effort to limit this patient's exposure and mitigate transmission in our community.  Due to his co-morbid illnesses, this patient is at least at moderate risk for complications without adequate follow up.  This format is felt to be most appropriate for this patient at this time.  All issues noted in this document were discussed and addressed.  A limited physical exam was performed with this format.  Please refer to the patient's chart for his consent to telehealth for [facility]     Date:  04/06/2019   ID:  Jordan Hammond, DOB Aug 25, 1940, MRN [id number]  The patient is home. I am in the office/Clinic  PCP:  Pap[place], [place], DO  Cardiologist:  No primary care provider on file.  Electrophysiologist:  None   Evaluation Performed:  Follow up visit.  Chief Complaint:  CAD, S/p AVR follow up.  History of Present Illness:    Jordan Hammond is a 78 y.o. male with CAD s/p CABG X2 with LIMA to LAD, Saphenous vein graft to the obtuse marginal, Aortic stenosis s/p Aortic valve replacement using a 25 mm Magna Ease pericardial tissue valve, serial #[id number] and hyperlipidemia.   Today follow up via telehealth.   The patient does not have symptoms concerning for COVID-19 infection fever, chills, cough, or new shortness of breath.   He reports that he has been doing well since his last tele visit with Dr. Bettina Gavia. He denies chest pain, shortness of breath, syncope or palpitation.   No recent labs.   Past Medical History:  Diagnosis Date  . Aortic atherosclerosis ([place]) 01/20/2017  . Aortic stenosis 01/20/2017  . CAD (coronary artery disease), native coronary artery 01/20/2017  . GERD (gastroesophageal reflux disease)   . Hiatal hernia   . Hyperlipidemia   . Kidney stones   . Restless leg syndrome    Past Surgical  History:  Procedure Laterality Date  . AORTIC VALVE REPLACEMENT N/A 01/29/2017   Procedure: AORTIC VALVE REPLACEMENT (AVR);  Surgeon: Prescott Gum, Collier Salina, MD;  Location: [place];  Service: Open Heart Surgery;  Laterality: N/A;  . APPENDECTOMY    . bowel obstruction surgery    . CHOLECYSTECTOMY    . CORONARY ARTERY BYPASS GRAFT N/A 01/29/2017   Procedure: CORONARY ARTERY BYPASS GRAFTING (CABG)  x two, using left internal mammary artery and right leg greater saphenous vein harvested endoscopically;  Surgeon: Ivin Poot, MD;  Location: [place];  Service: Open Heart Surgery;  Laterality: N/A;  . KNEE ARTHROSCOPY    . lap nissan    . LEFT HEART CATH AND CORONARY ANGIOGRAPHY N/A 01/21/2017   Procedure: LEFT HEART CATH AND CORONARY ANGIOGRAPHY;  Surgeon: Lorretta Harp, MD;  Location: [place] CV LAB;  Service: Cardiovascular;  Laterality: N/A;  . RIGHT HEART CATH N/A 01/21/2017   Procedure: RIGHT HEART CATH;  Surgeon: Lorretta Harp, MD;  Location: [place] CV LAB;  Service: Cardiovascular;  Laterality: N/A;  . SHOULDER SURGERY    . TEE WITHOUT CARDIOVERSION N/A 01/29/2017   Procedure: TRANSESOPHAGEAL ECHOCARDIOGRAM (TEE);  Surgeon: Prescott Gum, Collier Salina, MD;  Location: [place];  Service: Open Heart Surgery;  Laterality: N/A;     Current Meds  Medication Sig  . acetaminophen (TYLENOL) 500 MG tablet Take 1,000 mg by mouth every 6 (six) hours as needed for headache.  Marland Kitchen aspirin  EC 81 MG EC tablet Take 1 tablet (81 mg total) by mouth daily.  Marland Kitchen. atorvastatin (LIPITOR) 20 MG tablet Take 1 tablet (20 mg total) by mouth daily.  . Bisacodyl (DULCOLAX PO) Take 2 tablets by mouth daily as needed.  . calcium citrate-vitamin D (CALCIUM + D) 315-200 MG-UNIT tablet Take 1 tablet by mouth daily.  . Cholecalciferol (VITAMIN D3) 5000 units CAPS Take 5,000 Units by mouth daily.  . clonazePAM (KLONOPIN) 1 MG tablet Take 1 mg by mouth at bedtime.  . diphenhydrAMINE (BENADRYL) 25 MG tablet Take 25 mg by mouth at  bedtime as needed for itching.  . Flaxseed Oil OIL Take 1,000 mg by mouth daily.  Marland Kitchen. gabapentin (NEURONTIN) 300 MG capsule Take 300 mg by mouth daily.  Marland Kitchen. levothyroxine (SYNTHROID) 50 MCG tablet Take 50 mcg by mouth daily before breakfast.  . Magnesium Hydroxide (MILK OF MAGNESIA PO) Take 1 tablet by mouth daily as needed.  . nitroGLYCERIN (NITROSTAT) 0.4 MG SL tablet Place under the tongue.  . vitamin B-12 (CYANOCOBALAMIN) 1000 MCG tablet Take 1,000 mcg by mouth daily.     Allergies:   Aspirin, Erythromycin, Fentanyl, Ibuprofen, Nabumetone, Nsaids, Sulfa antibiotics, Tetracycline, Sulfamethoxazole, and Sulfur   Social History   Tobacco Use  . Smoking status: Never Smoker  . Smokeless tobacco: Never Used  Substance Use Topics  . Alcohol use: No  . Drug use: No     Family Hx: The patient's family history includes CAD in his father; Dementia in his mother.  ROS:   Please see the history of present illness.     All other systems reviewed and are negative.   Prior CV studies:   The following studies were reviewed today:  TTE 03/2017 Mild LVH. LVEF 55%. Normal functioning bioprosthetic valve. Trace TR and MR.   Labs/Other Tests and Data Reviewed:    EKG:  None performed.   Recent Labs: No results found for requested labs within last 8760 hours.   Recent Lipid Panel Lab Results  Component Value Date/Time   CHOL 169 01/20/2017 12:52 PM   TRIG 92 01/20/2017 12:52 PM   HDL 48 01/20/2017 12:52 PM   CHOLHDL 3.5 01/20/2017 12:52 PM   LDLCALC 103 (H) 01/20/2017 12:52 PM    Wt Readings from Last 3 Encounters:  10/08/18 188 lb (85.3 kg)  05/07/18 187 lb 12.8 oz (85.2 kg)  05/08/17 182 lb (82.6 kg)     Objective:    Vital Signs:  Ht 6' (1.829 m)   BMI 25.50 kg/m    Not performed.  ASSESSMENT & PLAN:    1. CAD  2. Aortic stenosis s/p AVR 3. Hypertension   The patient is doing well from a CV standpoint. He is active and exercised daily. He will remain on his  current medication regimen. Aspirin 81 mg daily, Lipitor 20 mg daily.   He still needs to get BMP to assess his electrolytes and Cr. He plans to get this done with his pcp.    COVID-19 Education: The signs and symptoms of COVID-19 were discussed with the patient and how to seek care for testing (follow up with PCP or arrange E-visit).  The importance of social distancing was discussed today.  Time:   Today, I have spent 20 minutes with the patient with telehealth technology discussing the above problems.     Medication Adjustments/Labs and Tests Ordered: Current medicines are reviewed at length with the patient today.  Concerns regarding medicines are outlined above.  Tests Ordered: No orders of the defined types were placed in this encounter.   Medication Changes: No orders of the defined types were placed in this encounter.   Follow Up:  3-6 months with Dr. Dulce Sellar  Signed, Thomasene Ripple, DO  04/06/2019 10:36 PM    [facility]

## 2019-04-06 NOTE — Patient Instructions (Addendum)
Medication Instructions:  Your physician recommends that you continue on your current medications as directed. Please refer to the Current Medication list given to you today.  *If you need a refill on your cardiac medications before your next appointment, please call your pharmacy*  Lab Work: None If you have labs (blood work) drawn today and your tests are completely normal, you will receive your results only by: Marland Kitchen MyChart Message (if you have MyChart) OR . A paper copy in the mail If you have any lab test that is abnormal or we need to change your treatment, we will call you to review the results.  Testing/Procedures: NOne  Follow-Up: At [facility], you and your health needs are our priority.  As part of our continuing mission to provide you with exceptional heart care, we have created designated Provider Care Teams.  These Care Teams include your primary Cardiologist (physician) and Advanced Practice Providers (APPs -  Physician Assistants and Nurse Practitioners) who all work together to provide you with the care you need, when you need it.  Your next appointment:   4 months   The format for your next appointment:   In Person  Provider:   Shirlee More, MD  Other Instructions

## 2019-04-09 DIAGNOSIS — N189 Chronic kidney disease, unspecified: Secondary | ICD-10-CM | POA: Diagnosis not present

## 2019-04-09 DIAGNOSIS — E782 Mixed hyperlipidemia: Secondary | ICD-10-CM | POA: Diagnosis not present

## 2019-04-14 DIAGNOSIS — I2510 Atherosclerotic heart disease of native coronary artery without angina pectoris: Secondary | ICD-10-CM | POA: Diagnosis not present

## 2019-04-14 DIAGNOSIS — R5383 Other fatigue: Secondary | ICD-10-CM | POA: Diagnosis not present

## 2019-04-14 DIAGNOSIS — G2581 Restless legs syndrome: Secondary | ICD-10-CM | POA: Diagnosis not present

## 2019-04-27 DIAGNOSIS — N189 Chronic kidney disease, unspecified: Secondary | ICD-10-CM | POA: Diagnosis not present

## 2019-04-27 DIAGNOSIS — E782 Mixed hyperlipidemia: Secondary | ICD-10-CM | POA: Diagnosis not present

## 2019-04-30 DIAGNOSIS — R5383 Other fatigue: Secondary | ICD-10-CM | POA: Diagnosis not present

## 2019-04-30 DIAGNOSIS — I2510 Atherosclerotic heart disease of native coronary artery without angina pectoris: Secondary | ICD-10-CM | POA: Diagnosis not present

## 2019-04-30 DIAGNOSIS — E785 Hyperlipidemia, unspecified: Secondary | ICD-10-CM | POA: Diagnosis not present

## 2019-04-30 DIAGNOSIS — I351 Nonrheumatic aortic (valve) insufficiency: Secondary | ICD-10-CM | POA: Diagnosis not present

## 2019-05-19 DIAGNOSIS — E782 Mixed hyperlipidemia: Secondary | ICD-10-CM | POA: Diagnosis not present

## 2019-05-19 DIAGNOSIS — N189 Chronic kidney disease, unspecified: Secondary | ICD-10-CM | POA: Diagnosis not present

## 2019-05-25 DIAGNOSIS — R109 Unspecified abdominal pain: Secondary | ICD-10-CM | POA: Diagnosis not present

## 2019-05-25 DIAGNOSIS — R112 Nausea with vomiting, unspecified: Secondary | ICD-10-CM | POA: Diagnosis not present

## 2019-05-25 DIAGNOSIS — N189 Chronic kidney disease, unspecified: Secondary | ICD-10-CM | POA: Diagnosis not present

## 2019-05-25 DIAGNOSIS — I2510 Atherosclerotic heart disease of native coronary artery without angina pectoris: Secondary | ICD-10-CM | POA: Diagnosis not present

## 2019-05-25 DIAGNOSIS — E860 Dehydration: Secondary | ICD-10-CM | POA: Diagnosis not present

## 2019-05-25 DIAGNOSIS — R509 Fever, unspecified: Secondary | ICD-10-CM | POA: Diagnosis not present

## 2019-05-28 DIAGNOSIS — R109 Unspecified abdominal pain: Secondary | ICD-10-CM | POA: Diagnosis not present

## 2019-05-28 DIAGNOSIS — R112 Nausea with vomiting, unspecified: Secondary | ICD-10-CM | POA: Diagnosis not present

## 2019-05-29 DIAGNOSIS — Z951 Presence of aortocoronary bypass graft: Secondary | ICD-10-CM | POA: Diagnosis not present

## 2019-05-29 DIAGNOSIS — K56699 Other intestinal obstruction unspecified as to partial versus complete obstruction: Secondary | ICD-10-CM | POA: Diagnosis not present

## 2019-05-29 DIAGNOSIS — Z79899 Other long term (current) drug therapy: Secondary | ICD-10-CM | POA: Diagnosis not present

## 2019-05-29 DIAGNOSIS — R1084 Generalized abdominal pain: Secondary | ICD-10-CM | POA: Diagnosis not present

## 2019-05-29 DIAGNOSIS — K56609 Unspecified intestinal obstruction, unspecified as to partial versus complete obstruction: Secondary | ICD-10-CM | POA: Diagnosis not present

## 2019-05-29 DIAGNOSIS — Z7982 Long term (current) use of aspirin: Secondary | ICD-10-CM | POA: Diagnosis not present

## 2019-05-29 DIAGNOSIS — R112 Nausea with vomiting, unspecified: Secondary | ICD-10-CM | POA: Diagnosis not present

## 2019-05-29 DIAGNOSIS — R109 Unspecified abdominal pain: Secondary | ICD-10-CM | POA: Diagnosis not present

## 2019-05-29 DIAGNOSIS — R5381 Other malaise: Secondary | ICD-10-CM | POA: Diagnosis not present

## 2019-05-30 DIAGNOSIS — R109 Unspecified abdominal pain: Secondary | ICD-10-CM | POA: Diagnosis not present

## 2019-05-30 DIAGNOSIS — R112 Nausea with vomiting, unspecified: Secondary | ICD-10-CM | POA: Diagnosis not present

## 2019-05-30 DIAGNOSIS — R197 Diarrhea, unspecified: Secondary | ICD-10-CM | POA: Diagnosis not present

## 2019-05-31 DIAGNOSIS — R112 Nausea with vomiting, unspecified: Secondary | ICD-10-CM | POA: Diagnosis not present

## 2019-05-31 DIAGNOSIS — R109 Unspecified abdominal pain: Secondary | ICD-10-CM | POA: Diagnosis not present

## 2019-05-31 DIAGNOSIS — R197 Diarrhea, unspecified: Secondary | ICD-10-CM | POA: Diagnosis not present

## 2019-07-29 ENCOUNTER — Other Ambulatory Visit: Payer: Self-pay | Admitting: *Deleted

## 2019-07-29 MED ORDER — ATORVASTATIN CALCIUM 20 MG PO TABS: 20.0000 mg | ORAL_TABLET | Freq: Every day | ORAL | 0 refills | Status: DC

## 2019-12-28 ENCOUNTER — Other Ambulatory Visit: Payer: Self-pay

## 2019-12-28 MED ORDER — ATORVASTATIN CALCIUM 20 MG PO TABS: 20.0000 mg | ORAL_TABLET | Freq: Every day | ORAL | 0 refills | Status: AC

## 2019-12-28 NOTE — Telephone Encounter (Signed)
Rx refill sent to [facility] for Atorvastatin 20 mg

## 2020-07-21 DIAGNOSIS — B351 Tinea unguium: Secondary | ICD-10-CM | POA: Diagnosis not present

## 2020-09-05 DIAGNOSIS — R1030 Lower abdominal pain, unspecified: Secondary | ICD-10-CM | POA: Diagnosis not present

## 2020-09-05 DIAGNOSIS — R1084 Generalized abdominal pain: Secondary | ICD-10-CM | POA: Diagnosis not present

## 2020-09-06 DIAGNOSIS — R319 Hematuria, unspecified: Secondary | ICD-10-CM | POA: Diagnosis not present

## 2020-09-13 DIAGNOSIS — R109 Unspecified abdominal pain: Secondary | ICD-10-CM | POA: Diagnosis not present

## 2020-09-13 DIAGNOSIS — R1084 Generalized abdominal pain: Secondary | ICD-10-CM | POA: Diagnosis not present

## 2020-10-04 DIAGNOSIS — R509 Fever, unspecified: Secondary | ICD-10-CM | POA: Diagnosis not present

## 2020-10-31 DIAGNOSIS — R1084 Generalized abdominal pain: Secondary | ICD-10-CM | POA: Diagnosis not present

## 2020-10-31 DIAGNOSIS — R0602 Shortness of breath: Secondary | ICD-10-CM | POA: Diagnosis not present

## 2020-10-31 DIAGNOSIS — R0600 Dyspnea, unspecified: Secondary | ICD-10-CM | POA: Diagnosis not present

## 2020-10-31 DIAGNOSIS — Z952 Presence of prosthetic heart valve: Secondary | ICD-10-CM | POA: Diagnosis not present

## 2020-10-31 DIAGNOSIS — R109 Unspecified abdominal pain: Secondary | ICD-10-CM | POA: Diagnosis not present

## 2020-10-31 DIAGNOSIS — Z8616 Personal history of COVID-19: Secondary | ICD-10-CM | POA: Diagnosis not present

## 2020-11-08 DIAGNOSIS — R52 Pain, unspecified: Secondary | ICD-10-CM | POA: Diagnosis not present

## 2020-11-08 DIAGNOSIS — E785 Hyperlipidemia, unspecified: Secondary | ICD-10-CM | POA: Diagnosis not present

## 2020-11-08 DIAGNOSIS — I2510 Atherosclerotic heart disease of native coronary artery without angina pectoris: Secondary | ICD-10-CM | POA: Diagnosis not present

## 2020-11-08 DIAGNOSIS — R5383 Other fatigue: Secondary | ICD-10-CM | POA: Diagnosis not present

## 2020-11-08 DIAGNOSIS — Z79899 Other long term (current) drug therapy: Secondary | ICD-10-CM | POA: Diagnosis not present

## 2020-11-08 DIAGNOSIS — R350 Frequency of micturition: Secondary | ICD-10-CM | POA: Diagnosis not present

## 2020-11-08 DIAGNOSIS — E538 Deficiency of other specified B group vitamins: Secondary | ICD-10-CM | POA: Diagnosis not present

## 2020-11-15 DIAGNOSIS — M1711 Unilateral primary osteoarthritis, right knee: Secondary | ICD-10-CM | POA: Diagnosis not present

## 2020-11-15 DIAGNOSIS — E039 Hypothyroidism, unspecified: Secondary | ICD-10-CM | POA: Diagnosis not present

## 2020-11-15 DIAGNOSIS — Z0000 Encounter for general adult medical examination without abnormal findings: Secondary | ICD-10-CM | POA: Diagnosis not present

## 2020-11-15 DIAGNOSIS — G2581 Restless legs syndrome: Secondary | ICD-10-CM | POA: Diagnosis not present

## 2020-11-15 DIAGNOSIS — M159 Polyosteoarthritis, unspecified: Secondary | ICD-10-CM | POA: Diagnosis not present

## 2020-11-15 DIAGNOSIS — E782 Mixed hyperlipidemia: Secondary | ICD-10-CM | POA: Diagnosis not present

## 2020-11-15 DIAGNOSIS — M25561 Pain in right knee: Secondary | ICD-10-CM | POA: Diagnosis not present

## 2020-11-15 DIAGNOSIS — I2510 Atherosclerotic heart disease of native coronary artery without angina pectoris: Secondary | ICD-10-CM | POA: Diagnosis not present

## 2020-11-15 DIAGNOSIS — N189 Chronic kidney disease, unspecified: Secondary | ICD-10-CM | POA: Diagnosis not present

## 2020-11-15 DIAGNOSIS — R42 Dizziness and giddiness: Secondary | ICD-10-CM | POA: Diagnosis not present

## 2020-11-15 DIAGNOSIS — E785 Hyperlipidemia, unspecified: Secondary | ICD-10-CM | POA: Diagnosis not present

## 2020-11-15 DIAGNOSIS — Z8616 Personal history of COVID-19: Secondary | ICD-10-CM | POA: Diagnosis not present

## 2020-11-15 DIAGNOSIS — I25709 Atherosclerosis of coronary artery bypass graft(s), unspecified, with unspecified angina pectoris: Secondary | ICD-10-CM | POA: Diagnosis not present

## 2020-12-15 DIAGNOSIS — I350 Nonrheumatic aortic (valve) stenosis: Secondary | ICD-10-CM | POA: Diagnosis not present

## 2020-12-21 DIAGNOSIS — H524 Presbyopia: Secondary | ICD-10-CM | POA: Diagnosis not present

## 2020-12-21 DIAGNOSIS — H02831 Dermatochalasis of right upper eyelid: Secondary | ICD-10-CM | POA: Diagnosis not present

## 2020-12-21 DIAGNOSIS — H02834 Dermatochalasis of left upper eyelid: Secondary | ICD-10-CM | POA: Diagnosis not present

## 2020-12-21 DIAGNOSIS — H43813 Vitreous degeneration, bilateral: Secondary | ICD-10-CM | POA: Diagnosis not present

## 2020-12-21 DIAGNOSIS — H04123 Dry eye syndrome of bilateral lacrimal glands: Secondary | ICD-10-CM | POA: Diagnosis not present

## 2021-01-18 DIAGNOSIS — R059 Cough, unspecified: Secondary | ICD-10-CM | POA: Diagnosis not present

## 2021-01-18 DIAGNOSIS — S29012A Strain of muscle and tendon of back wall of thorax, initial encounter: Secondary | ICD-10-CM | POA: Diagnosis not present

## 2021-01-26 DIAGNOSIS — T466X5A Adverse effect of antihyperlipidemic and antiarteriosclerotic drugs, initial encounter: Secondary | ICD-10-CM | POA: Diagnosis not present

## 2021-01-26 DIAGNOSIS — M542 Cervicalgia: Secondary | ICD-10-CM | POA: Diagnosis not present

## 2021-01-26 DIAGNOSIS — R519 Headache, unspecified: Secondary | ICD-10-CM | POA: Diagnosis not present

## 2021-01-26 DIAGNOSIS — R0789 Other chest pain: Secondary | ICD-10-CM | POA: Diagnosis not present

## 2021-04-14 DIAGNOSIS — Z23 Encounter for immunization: Secondary | ICD-10-CM | POA: Diagnosis not present

## 2021-04-25 DIAGNOSIS — B356 Tinea cruris: Secondary | ICD-10-CM | POA: Diagnosis not present

## 2021-04-25 DIAGNOSIS — M542 Cervicalgia: Secondary | ICD-10-CM | POA: Diagnosis not present

## 2021-04-25 DIAGNOSIS — I2510 Atherosclerotic heart disease of native coronary artery without angina pectoris: Secondary | ICD-10-CM | POA: Diagnosis not present

## 2021-04-25 DIAGNOSIS — R0789 Other chest pain: Secondary | ICD-10-CM | POA: Diagnosis not present

## 2021-04-25 DIAGNOSIS — R519 Headache, unspecified: Secondary | ICD-10-CM | POA: Diagnosis not present

## 2021-04-27 DIAGNOSIS — M47812 Spondylosis without myelopathy or radiculopathy, cervical region: Secondary | ICD-10-CM | POA: Diagnosis not present

## 2021-04-27 DIAGNOSIS — M47892 Other spondylosis, cervical region: Secondary | ICD-10-CM | POA: Diagnosis not present

## 2021-04-27 DIAGNOSIS — B029 Zoster without complications: Secondary | ICD-10-CM | POA: Diagnosis not present

## 2021-04-27 DIAGNOSIS — M542 Cervicalgia: Secondary | ICD-10-CM | POA: Diagnosis not present

## 2021-05-02 DIAGNOSIS — B029 Zoster without complications: Secondary | ICD-10-CM | POA: Diagnosis not present

## 2021-05-09 DIAGNOSIS — N189 Chronic kidney disease, unspecified: Secondary | ICD-10-CM | POA: Diagnosis not present

## 2021-05-09 DIAGNOSIS — E785 Hyperlipidemia, unspecified: Secondary | ICD-10-CM | POA: Diagnosis not present

## 2021-05-09 DIAGNOSIS — G2581 Restless legs syndrome: Secondary | ICD-10-CM | POA: Diagnosis not present

## 2021-05-09 DIAGNOSIS — Z79899 Other long term (current) drug therapy: Secondary | ICD-10-CM | POA: Diagnosis not present

## 2021-05-09 DIAGNOSIS — I2510 Atherosclerotic heart disease of native coronary artery without angina pectoris: Secondary | ICD-10-CM | POA: Diagnosis not present

## 2021-05-09 DIAGNOSIS — E559 Vitamin D deficiency, unspecified: Secondary | ICD-10-CM | POA: Diagnosis not present

## 2021-05-09 DIAGNOSIS — Z954 Presence of other heart-valve replacement: Secondary | ICD-10-CM | POA: Diagnosis not present

## 2021-05-17 DIAGNOSIS — N189 Chronic kidney disease, unspecified: Secondary | ICD-10-CM | POA: Diagnosis not present

## 2021-05-17 DIAGNOSIS — E782 Mixed hyperlipidemia: Secondary | ICD-10-CM | POA: Diagnosis not present

## 2021-05-17 DIAGNOSIS — M159 Polyosteoarthritis, unspecified: Secondary | ICD-10-CM | POA: Diagnosis not present

## 2021-05-17 DIAGNOSIS — B029 Zoster without complications: Secondary | ICD-10-CM | POA: Diagnosis not present

## 2021-05-17 DIAGNOSIS — M542 Cervicalgia: Secondary | ICD-10-CM | POA: Diagnosis not present

## 2021-05-17 DIAGNOSIS — I2510 Atherosclerotic heart disease of native coronary artery without angina pectoris: Secondary | ICD-10-CM | POA: Diagnosis not present

## 2021-05-26 DIAGNOSIS — M545 Low back pain, unspecified: Secondary | ICD-10-CM | POA: Diagnosis not present

## 2021-05-26 DIAGNOSIS — R109 Unspecified abdominal pain: Secondary | ICD-10-CM | POA: Diagnosis not present

## 2021-06-13 DIAGNOSIS — B356 Tinea cruris: Secondary | ICD-10-CM | POA: Diagnosis not present

## 2021-06-30 DIAGNOSIS — B349 Viral infection, unspecified: Secondary | ICD-10-CM | POA: Diagnosis not present

## 2021-06-30 DIAGNOSIS — R509 Fever, unspecified: Secondary | ICD-10-CM | POA: Diagnosis not present

## 2021-06-30 DIAGNOSIS — E538 Deficiency of other specified B group vitamins: Secondary | ICD-10-CM | POA: Diagnosis not present

## 2021-06-30 DIAGNOSIS — R350 Frequency of micturition: Secondary | ICD-10-CM | POA: Diagnosis not present

## 2021-07-19 DIAGNOSIS — J069 Acute upper respiratory infection, unspecified: Secondary | ICD-10-CM | POA: Diagnosis not present

## 2021-07-19 DIAGNOSIS — R059 Cough, unspecified: Secondary | ICD-10-CM | POA: Diagnosis not present

## 2021-07-19 DIAGNOSIS — J209 Acute bronchitis, unspecified: Secondary | ICD-10-CM | POA: Diagnosis not present

## 2021-07-19 DIAGNOSIS — I2510 Atherosclerotic heart disease of native coronary artery without angina pectoris: Secondary | ICD-10-CM | POA: Diagnosis not present

## 2021-09-28 DIAGNOSIS — E7849 Other hyperlipidemia: Secondary | ICD-10-CM | POA: Diagnosis not present

## 2021-09-28 DIAGNOSIS — Z882 Allergy status to sulfonamides status: Secondary | ICD-10-CM | POA: Diagnosis not present

## 2021-09-28 DIAGNOSIS — E785 Hyperlipidemia, unspecified: Secondary | ICD-10-CM | POA: Diagnosis not present

## 2021-09-28 DIAGNOSIS — Z888 Allergy status to other drugs, medicaments and biological substances status: Secondary | ICD-10-CM | POA: Diagnosis not present

## 2021-09-28 DIAGNOSIS — Z1211 Encounter for screening for malignant neoplasm of colon: Secondary | ICD-10-CM | POA: Diagnosis not present

## 2021-09-28 DIAGNOSIS — R1310 Dysphagia, unspecified: Secondary | ICD-10-CM | POA: Diagnosis not present

## 2021-09-28 DIAGNOSIS — E039 Hypothyroidism, unspecified: Secondary | ICD-10-CM | POA: Diagnosis not present

## 2021-09-28 DIAGNOSIS — K222 Esophageal obstruction: Secondary | ICD-10-CM | POA: Diagnosis not present

## 2021-09-28 DIAGNOSIS — I2510 Atherosclerotic heart disease of native coronary artery without angina pectoris: Secondary | ICD-10-CM | POA: Diagnosis not present

## 2021-09-28 DIAGNOSIS — Z79899 Other long term (current) drug therapy: Secondary | ICD-10-CM | POA: Diagnosis not present

## 2021-09-28 DIAGNOSIS — K209 Esophagitis, unspecified without bleeding: Secondary | ICD-10-CM | POA: Diagnosis not present

## 2021-09-28 DIAGNOSIS — K635 Polyp of colon: Secondary | ICD-10-CM | POA: Diagnosis not present

## 2021-09-28 DIAGNOSIS — Z955 Presence of coronary angioplasty implant and graft: Secondary | ICD-10-CM | POA: Diagnosis not present

## 2021-09-28 DIAGNOSIS — K219 Gastro-esophageal reflux disease without esophagitis: Secondary | ICD-10-CM | POA: Diagnosis not present

## 2021-10-03 DIAGNOSIS — Z1211 Encounter for screening for malignant neoplasm of colon: Secondary | ICD-10-CM | POA: Diagnosis not present

## 2021-10-03 DIAGNOSIS — K621 Rectal polyp: Secondary | ICD-10-CM | POA: Diagnosis not present

## 2021-11-29 DIAGNOSIS — M25561 Pain in right knee: Secondary | ICD-10-CM | POA: Diagnosis not present

## 2021-12-14 DIAGNOSIS — I2510 Atherosclerotic heart disease of native coronary artery without angina pectoris: Secondary | ICD-10-CM | POA: Diagnosis not present

## 2021-12-14 DIAGNOSIS — Z87821 Personal history of retained foreign body fully removed: Secondary | ICD-10-CM | POA: Diagnosis not present

## 2022-04-13 DIAGNOSIS — M542 Cervicalgia: Secondary | ICD-10-CM | POA: Diagnosis not present

## 2022-04-13 DIAGNOSIS — N529 Male erectile dysfunction, unspecified: Secondary | ICD-10-CM | POA: Diagnosis not present

## 2022-04-13 DIAGNOSIS — G2581 Restless legs syndrome: Secondary | ICD-10-CM | POA: Diagnosis not present

## 2022-04-13 DIAGNOSIS — R519 Headache, unspecified: Secondary | ICD-10-CM | POA: Diagnosis not present

## 2022-04-13 DIAGNOSIS — E538 Deficiency of other specified B group vitamins: Secondary | ICD-10-CM | POA: Diagnosis not present

## 2022-04-24 ENCOUNTER — Encounter (HOSPITAL_COMMUNITY): Payer: Self-pay

## 2022-04-24 ENCOUNTER — Other Ambulatory Visit: Payer: Self-pay

## 2022-04-24 ENCOUNTER — Telehealth: Payer: Self-pay

## 2022-04-24 ENCOUNTER — Emergency Department (HOSPITAL_COMMUNITY): Payer: Medicare HMO

## 2022-04-24 ENCOUNTER — Observation Stay (HOSPITAL_COMMUNITY)
Admission: EM | Admit: 2022-04-24 | Discharge: 2022-04-25 | Disposition: A | Discharge status: Home / Self Care | Payer: Medicare HMO | Attending: Internal Medicine | Admitting: Internal Medicine

## 2022-04-24 DIAGNOSIS — K219 Gastro-esophageal reflux disease without esophagitis: Secondary | ICD-10-CM | POA: Diagnosis present

## 2022-04-24 DIAGNOSIS — R072 Precordial pain: Secondary | ICD-10-CM

## 2022-04-24 DIAGNOSIS — R0789 Other chest pain: Secondary | ICD-10-CM | POA: Diagnosis not present

## 2022-04-24 DIAGNOSIS — R531 Weakness: Secondary | ICD-10-CM | POA: Diagnosis not present

## 2022-04-24 DIAGNOSIS — I2510 Atherosclerotic heart disease of native coronary artery without angina pectoris: Secondary | ICD-10-CM | POA: Diagnosis not present

## 2022-04-24 DIAGNOSIS — G8929 Other chronic pain: Secondary | ICD-10-CM | POA: Diagnosis not present

## 2022-04-24 DIAGNOSIS — E039 Hypothyroidism, unspecified: Secondary | ICD-10-CM | POA: Diagnosis not present

## 2022-04-24 DIAGNOSIS — Z7982 Long term (current) use of aspirin: Secondary | ICD-10-CM | POA: Diagnosis not present

## 2022-04-24 DIAGNOSIS — E785 Hyperlipidemia, unspecified: Secondary | ICD-10-CM | POA: Diagnosis not present

## 2022-04-24 DIAGNOSIS — K279 Peptic ulcer, site unspecified, unspecified as acute or chronic, without hemorrhage or perforation: Secondary | ICD-10-CM | POA: Diagnosis present

## 2022-04-24 DIAGNOSIS — N179 Acute kidney failure, unspecified: Secondary | ICD-10-CM | POA: Diagnosis not present

## 2022-04-24 DIAGNOSIS — Z951 Presence of aortocoronary bypass graft: Secondary | ICD-10-CM | POA: Diagnosis not present

## 2022-04-24 DIAGNOSIS — R000 Tachycardia, unspecified: Secondary | ICD-10-CM

## 2022-04-24 DIAGNOSIS — Z79899 Other long term (current) drug therapy: Secondary | ICD-10-CM | POA: Diagnosis not present

## 2022-04-24 DIAGNOSIS — I350 Nonrheumatic aortic (valve) stenosis: Secondary | ICD-10-CM

## 2022-04-24 DIAGNOSIS — R001 Bradycardia, unspecified: Principal | ICD-10-CM | POA: Diagnosis present

## 2022-04-24 DIAGNOSIS — R079 Chest pain, unspecified: Secondary | ICD-10-CM | POA: Diagnosis present

## 2022-04-24 DIAGNOSIS — R002 Palpitations: Secondary | ICD-10-CM | POA: Diagnosis not present

## 2022-04-24 DIAGNOSIS — Z952 Presence of prosthetic heart valve: Secondary | ICD-10-CM | POA: Diagnosis not present

## 2022-04-24 LAB — COMPREHENSIVE METABOLIC PANEL
ALT: 19 U/L (ref 0–44)
AST: 24 U/L (ref 15–41)
Albumin: 3.6 g/dL (ref 3.5–5.0)
Alkaline Phosphatase: 44 U/L (ref 38–126)
Anion gap: 10 (ref 5–15)
BUN: 30 mg/dL — ABNORMAL HIGH (ref 8–23)
CO2: 22 mmol/L (ref 22–32)
Calcium: 9 mg/dL (ref 8.9–10.3)
Chloride: 107 mmol/L (ref 98–111)
Creatinine, Ser: 1.25 mg/dL — ABNORMAL HIGH (ref 0.61–1.24)
GFR, Estimated: 58 mL/min — ABNORMAL LOW (ref >60)
Glucose, Bld: 74 mg/dL (ref 70–99)
Potassium: 4.2 mmol/L (ref 3.5–5.1)
Sodium: 139 mmol/L (ref 135–145)
Total Bilirubin: 0.6 mg/dL (ref 0.3–1.2)
Total Protein: 5.9 g/dL — ABNORMAL LOW (ref 6.5–8.1)

## 2022-04-24 LAB — TROPONIN I (HIGH SENSITIVITY)
Troponin I (High Sensitivity): 5 ng/L (ref <18)
Troponin I (High Sensitivity): 4 ng/L (ref <18)

## 2022-04-24 LAB — CBC
HCT: 42.3 % (ref 39.0–52.0)
Hemoglobin: 13.4 g/dL (ref 13.0–17.0)
MCH: 30.8 pg (ref 26.0–34.0)
MCHC: 31.7 g/dL (ref 30.0–36.0)
MCV: 97.2 fL (ref 80.0–100.0)
Platelets: 148 10*3/uL — ABNORMAL LOW (ref 150–400)
RBC: 4.35 MIL/uL (ref 4.22–5.81)
RDW: 12.8 % (ref 11.5–15.5)
WBC: 6.1 10*3/uL (ref 4.0–10.5)
nRBC: 0 % (ref 0.0–0.2)

## 2022-04-24 LAB — MAGNESIUM: Magnesium: 2.1 mg/dL (ref 1.7–2.4)

## 2022-04-24 LAB — TSH: TSH: 1.894 u[IU]/mL (ref 0.350–4.500)

## 2022-04-24 MED ORDER — ALUM & MAG HYDROXIDE-SIMETH 200-200-20 MG/5ML PO SUSP
30.0000 mL | Freq: Once | ORAL | Status: AC
  Administered 2022-04-24: 30 mL via ORAL
  Filled 2022-04-24: qty 30

## 2022-04-24 MED ORDER — FAMOTIDINE IN NACL 20-0.9 MG/50ML-% IV SOLN
20.0000 mg | Freq: Once | INTRAVENOUS | Status: AC
  Administered 2022-04-24: 20 mg via INTRAVENOUS
  Filled 2022-04-24: qty 50

## 2022-04-24 MED ORDER — MORPHINE SULFATE (PF) 2 MG/ML IV SOLN
2.0000 mg | Freq: Once | INTRAVENOUS | Status: AC
  Administered 2022-04-24: 2 mg via INTRAVENOUS
  Filled 2022-04-24: qty 1

## 2022-04-24 MED ORDER — ASPIRIN 81 MG PO TBEC
81.0000 mg | DELAYED_RELEASE_TABLET | Freq: Every day | ORAL | Status: DC
  Administered 2022-04-25: 81 mg via ORAL
  Filled 2022-04-24: qty 1

## 2022-04-24 MED ORDER — ACETAMINOPHEN 325 MG PO TABS: 650.0000 mg | ORAL_TABLET | Freq: Four times a day (QID) | ORAL | Status: DC | PRN

## 2022-04-24 MED ORDER — ACETAMINOPHEN 650 MG RE SUPP: 650.0000 mg | Freq: Four times a day (QID) | RECTAL | Status: DC | PRN

## 2022-04-24 MED ORDER — FAMOTIDINE 20 MG PO TABS: 40.0000 mg | ORAL_TABLET | Freq: Every day | ORAL | Status: DC

## 2022-04-24 MED ORDER — SODIUM CHLORIDE 0.9 % IV SOLN: INTRAVENOUS | Status: AC

## 2022-04-24 MED ORDER — LEVOTHYROXINE SODIUM 50 MCG PO TABS
50.0000 ug | ORAL_TABLET | Freq: Every day | ORAL | Status: DC
  Administered 2022-04-25: 50 ug via ORAL
  Filled 2022-04-24: qty 2

## 2022-04-24 MED ORDER — FAMOTIDINE 20 MG PO TABS
40.0000 mg | ORAL_TABLET | Freq: Every day | ORAL | Status: DC
  Administered 2022-04-25: 40 mg via ORAL
  Filled 2022-04-24: qty 2

## 2022-04-24 MED ORDER — ENOXAPARIN SODIUM 40 MG/0.4ML IJ SOSY
40.0000 mg | PREFILLED_SYRINGE | INTRAMUSCULAR | Status: DC
  Administered 2022-04-24: 40 mg via SUBCUTANEOUS
  Filled 2022-04-24: qty 0.4

## 2022-04-24 MED ORDER — CLONAZEPAM 0.5 MG PO TABS
1.0000 mg | ORAL_TABLET | Freq: Every day | ORAL | Status: DC
  Administered 2022-04-24: 1 mg via ORAL
  Filled 2022-04-24: qty 2

## 2022-04-24 MED ORDER — ATORVASTATIN CALCIUM 10 MG PO TABS
20.0000 mg | ORAL_TABLET | Freq: Every day | ORAL | Status: DC
  Administered 2022-04-24 – 2022-04-25 (×2): 20 mg via ORAL
  Filled 2022-04-24 (×2): qty 2

## 2022-04-24 MED ORDER — GABAPENTIN 300 MG PO CAPS
300.0000 mg | ORAL_CAPSULE | Freq: Every day | ORAL | Status: DC
  Administered 2022-04-24 – 2022-04-25 (×2): 300 mg via ORAL
  Filled 2022-04-24 (×2): qty 1

## 2022-04-24 NOTE — ED Provider Notes (Signed)
[facility] EMERGENCY DEPARTMENT Provider Note   CSN: [id number] Arrival date & time: 04/24/22  1403     History  Chief Complaint  Patient presents with   Palpitations    Jordan Hammond is a 81 y.o. male.   Palpitations Associated symptoms: chest pain, nausea and weakness (generalized)   Patient presents for chest pain and palpitations.  Onset was 10 AM this morning.  At the time he was at rest but had recently finished mild exertional activity.  Patient recently moved to the local area from [place] and has been involved with the moving process over the past several weeks.  Pain this morning did radiate to left arm and was improved with nitroglycerin.  Associated symptoms include nausea.  He took a total of 3 SL NTG's and 324 of ASA.  EMS was called.  When EMS arrived on scene, he was noted to have a heart rate of 40 with SBP of 92.  He was given 1 mg of atropine and 1 L of IV fluid.  His heart rate improved to in the 60s.  Currently he reports ongoing left-sided chest pain that is currently 4/10 in severity. His medical history includes, CAD s/p CABG x2, aortic stenosis s/p aortic valve replacement in 2018, HLD, CKD, GERD, PUD.       Home Medications Prior to Admission medications   Medication Sig Start Date End Date Taking? Authorizing Provider  ACETAMINOPHEN PO Take 1,300 mg by mouth daily.   Yes [provider]  aspirin EC 81 MG EC tablet Take 1 tablet (81 mg total) by mouth daily. 02/04/17  Yes Doree Fudge M, PA-C  atorvastatin (LIPITOR) 20 MG tablet Take 1 tablet (20 mg total) by mouth daily. 12/28/19  Yes Tobb, Kardie, DO  calcium carbonate (CALCIUM 600) 600 MG TABS tablet Take 600 mg by mouth daily.   Yes [provider]  Cholecalciferol (VITAMIN D3) 5000 units CAPS Take 5,000 Units by mouth daily.   Yes [provider]  clonazePAM (KLONOPIN) 1 MG tablet Take 1 mg by mouth at bedtime.   Yes [provider]   Coenzyme Q10-Vitamin E (QUNOL ULTRA COQ10 PO) Take 1 tablet by mouth daily.   Yes [provider]  diphenhydrAMINE (BENADRYL) 50 MG tablet Take 25 mg by mouth at bedtime.   Yes [provider]  docusate sodium (COLACE) 100 MG capsule Take 200 mg by mouth daily.   Yes [provider]  famotidine (PEPCID) 40 MG tablet Take 40 mg by mouth daily.   Yes [provider]  Flaxseed Oil OIL Take 1,000 mg by mouth daily.   Yes [provider]  gabapentin (NEURONTIN) 300 MG capsule Take 300 mg by mouth daily. 05/04/15  Yes [provider]  levothyroxine (SYNTHROID) 50 MCG tablet Take 50 mcg by mouth daily before breakfast.   Yes [provider]  MAGNESIUM PO Take 1 tablet by mouth daily.   Yes [provider]  naproxen (NAPROSYN) 500 MG tablet Take 500 mg by mouth 2 (two) times daily as needed for moderate pain.   Yes [provider]  nitroGLYCERIN (NITROSTAT) 0.4 MG SL tablet Place 0.4 mg under the tongue every 5 (five) minutes as needed for chest pain. 06/21/15  Yes [provider]  Simethicone (GAS FREE EXTRA STRENGTH PO) Take 1 tablet by mouth as needed (gas).   Yes [provider]  vitamin B-12 (CYANOCOBALAMIN) 1000 MCG tablet Take 5,000 mcg by mouth daily.  Yes [provider]      Allergies    Pravastatin, Aspirin, Erythromycin, Fentanyl, Ibuprofen, Nabumetone, Nsaids, Sulfa antibiotics, Tetracycline, Elemental sulfur, and Sulfamethoxazole    Review of Systems   Review of Systems  Constitutional:  Positive for fatigue.  Cardiovascular:  Positive for chest pain and palpitations.  Gastrointestinal:  Positive for nausea.  Neurological:  Positive for weakness (generalized).  All other systems reviewed and are negative.   Physical Exam Updated Vital Signs BP 92/61 (BP Location: Left Arm)   Pulse 86   Temp (!) 97.5 F (36.4 C) (Oral)   Resp 16   Ht 6' (1.829 m)   Wt 88.5 kg   SpO2  100%   BMI 26.45 kg/m  Physical Exam Vitals and nursing note reviewed.  Constitutional:      General: He is not in acute distress.    Appearance: Normal appearance. He is well-developed and normal weight. He is not toxic-appearing or diaphoretic.  HENT:     Head: Normocephalic and atraumatic.     Right Ear: External ear normal.     Left Ear: External ear normal.     Nose: Nose normal.     Mouth/Throat:     Mouth: Mucous membranes are moist.     Pharynx: Oropharynx is clear.  Eyes:     Extraocular Movements: Extraocular movements intact.     Conjunctiva/sclera: Conjunctivae normal.  Cardiovascular:     Rate and Rhythm: Normal rate and regular rhythm.     Heart sounds: No murmur heard. Pulmonary:     Effort: Pulmonary effort is normal. No respiratory distress.     Breath sounds: Normal breath sounds. No wheezing or rales.  Chest:     Chest wall: Tenderness present.  Abdominal:     General: There is no distension.     Palpations: Abdomen is soft.     Tenderness: There is no abdominal tenderness.  Musculoskeletal:        General: No swelling or deformity. Normal range of motion.     Cervical back: Normal range of motion and neck supple.     Right lower leg: No edema.     Left lower leg: No edema.  Skin:    General: Skin is warm and dry.     Capillary Refill: Capillary refill takes less than 2 seconds.     Coloration: Skin is not jaundiced or pale.  Neurological:     General: No focal deficit present.     Mental Status: He is alert and oriented to person, place, and time.     Cranial Nerves: No cranial nerve deficit.     Sensory: No sensory deficit.     Motor: No weakness.     Coordination: Coordination normal.  Psychiatric:        Mood and Affect: Mood normal.        Behavior: Behavior normal.        Thought Content: Thought content normal.        Judgment: Judgment normal.     ED Results / Procedures / Treatments   Labs (all labs ordered are listed, but only  abnormal results are displayed) Labs Reviewed  CBC - Abnormal; Notable for the following components:      Result Value   Platelets 148 (*)    All other components within normal limits  COMPREHENSIVE METABOLIC PANEL - Abnormal; Notable for the following components:   BUN 30 (*)    Creatinine, Ser 1.25 (*)  Total Protein 5.9 (*)    GFR, Estimated 58 (*)    All other components within normal limits  MAGNESIUM  TSH  CBG MONITORING, ED  TROPONIN I (HIGH SENSITIVITY)  TROPONIN I (HIGH SENSITIVITY)    EKG EKG Interpretation  Date/Time:  Tuesday April 24 2022 14:10:36 EST Ventricular Rate:  61 PR Interval:  190 QRS Duration: 86 QT Interval:  444 QTC Calculation: 446 R Axis:   54 Text Interpretation: Normal sinus rhythm Confirmed by Godfrey Pick [phone]) on 04/24/2022 3:05:58 PM  Radiology DG Chest 1 View  Result Date: 04/24/2022 CLINICAL DATA:  Palpitations and chest tenderness. EXAM: CHEST  1 VIEW COMPARISON:  03/06/2017 FINDINGS: Previous median sternotomy and CABG. Previous aortic valve replacement. Heart size is normal. No evidence of active infiltrate, mass, effusion or collapse. IMPRESSION: No active disease. Previous CABG and aortic valve replacement. Electronically Signed   By: Nelson Chimes M.D.   On: 04/24/2022 14:50    Procedures Procedures    Medications Ordered in ED Medications  morphine (PF) 2 MG/ML injection 2 mg (2 mg Intravenous Given 04/24/22 1550)  alum & mag hydroxide-simeth (MAALOX/MYLANTA) 200-200-20 MG/5ML suspension 30 mL (30 mLs Oral Given 04/24/22 1829)  famotidine (PEPCID) IVPB 20 mg premix (0 mg Intravenous Stopped 04/24/22 1920)    ED Course/ Medical Decision Making/ A&P                           Medical Decision Making Amount and/or Complexity of Data Reviewed Labs: ordered.  Risk OTC drugs. Prescription drug management. Decision regarding hospitalization.   This patient presents to the ED for concern of chest pain and palpitations,  this involves an extensive number of treatment options, and is a complaint that carries with it a high risk of complications and morbidity.  The differential diagnosis includes ACS, arrhythmia, pericarditis, costochondritis, GERD   Co morbidities that complicate the patient evaluation  CAD s/p CABG x2, aortic stenosis s/p aortic valve replacement in 2018, HLD, CKD, GERD, PUD   Additional history obtained:  Additional history obtained from patient's wife External records from outside source obtained and reviewed including EMR   Lab Tests:  I Ordered, and personally interpreted labs.  The pertinent results include: Normal hemoglobin, no leukocytosis, normal electrolytes, normal troponin x2   Imaging Studies ordered:  I ordered imaging studies including chest x-ray I independently visualized and interpreted imaging which showed no acute findings I agree with the radiologist interpretation   Cardiac Monitoring: / EKG:  The patient was maintained on a cardiac monitor.  I personally viewed and interpreted the cardiac monitored which showed an underlying rhythm of: Sinus rhythm/junctional rhythm   Consultations Obtained:  I requested consultation with the cardiologist, Dr. Stanford Breed,  and discussed lab and imaging findings as well as pertinent plan - they recommend: Admission with telemetry bed   Problem List / ED Course / Critical interventions / Medication management  Patient with history of CAD s/p CABG, presenting for onset of left-sided chest pain today.  Although he was at rest at time of onset, he had recently exerted himself.  He had radiation of pain into left arm and did have associated nausea.  Symptoms did improve with NTG.  EMS noted bradycardia with a rate of 40 on arrival.  He was given atropine and IV fluids prior to arrival.  Rhythm strips are shown below:   On arrival, patient has heart rate in the range of 60.  Initial EKG  does not show any ST segment changes.  He  is well-appearing on exam.  He endorses ongoing 4/10 severity chest pain.  He does have associated tenderness on exam.  No cardiac rubs or murmurs are appreciated on auscultation.  Patient was placed on bedside cardiac monitor.  Laboratory work-up was initiated.  Morphine was given for analgesia.  Although patient's chest pain improved, he did endorse some epigastric pain.  He does have history of GERD.  GI cocktail was ordered.  Lab work is reassuring with normal troponins.  Given this, and the reproducibility of his chest pain, symptoms unlikely related to ACS.  On cardiac monitor, patient had intermittent sinus rhythm with what appears to be junctional rhythm.  Heart rate remained in the range of 50.  Per chart review, patient's baseline heart rate is in the 60s and 70s.  I discussed this with cardiologist on-call, Dr. Stanford Breed, who will see the patient in consult.  He did recommend hospitalist admission.  Patient was admitted to medicine for further management. I ordered medication including morphine for analgesia, GI cocktail for her epigastric pain Reevaluation of the patient after these medicines showed that the patient improved I have reviewed the patients home medicines and have made adjustments as needed   Social Determinants of Health:  Recently moved to local area        Final Clinical Impression(s) / ED Diagnoses Final diagnoses:  Bradycardia  Palpitations  Chest pain, unspecified type    Rx / DC Orders ED Discharge Orders     None         Godfrey Pick, MD 04/24/22 [phone]

## 2022-04-24 NOTE — Telephone Encounter (Signed)
Patient's wife contacted the office concerned about her husbands chest pressure and tachycardia. She states that Dr. Donata Clay preformed a CABG and AVR in 2018 and they have since moved from [facility] to [place] 3 weeks ago. He started having symptoms today and took a nitroglycerin and aspirin, the pressure went away, but came back a short time afterwards. She states that his blood pressure and o2 sats are normal. Advised that due to him being symptomatic, he should go to the nearest emergency room to be evaluated. She acknowledged receipt, stated that she would give another nitroglycerin and call 911.

## 2022-04-24 NOTE — H&P (Signed)
History and Physical    Jordan Hammond:[id number] DOB: Aug 21, 1940 DOA: 04/24/2022  PCP: Norm Parcel, DO  Patient coming from: Home  Chief Complaint: Chest pain  HPI: Jordan Hammond is a 81 y.o. male with medical history significant of CAD status post CABG x2, hyperlipidemia, aortic stenosis status post AVR in 2018, GERD, hypothyroidism, anxiety presented to the ED with complaints of chest pain and palpitations.  Patient's systolic blood pressure was 92 and heart rate 40 with EMS.  He was given atropine with improvement of heart rate to 60s.  Also given 1 L normal saline bolus.  Patient had taken 3 nitroglycerin and 324 mg aspirin prior to EMS arrival.  In the ED, heart rate noted to be in the 50s.  EKGs showing sinus rhythm versus junctional bradycardia and no acute ischemic changes.  Blood pressure stable.  Troponin negative x2.  TSH normal.  Chest x-ray showing no active disease. Patient was given GI cocktail, morphine, and Pepcid. Cardiology consulted.  [facility] called to admit.  Patient states this morning he climbed a ladder to do some work in his house.  He then sat down and had breakfast and afterwards started experiencing sudden onset substernal chest tightness and his heart was racing.  He was also having episodes of intermittent sharp left-sided chest pain every few seconds.  He took 2 nitroglycerins and aspirin.  Symptoms persisted despite nitroglycerin and his wife called his cardiothoracic surgeons office and they advised coming to the emergency room.  At present, he reports feeling well and his symptoms have resolved.  He reports history of flu 2 weeks ago but has recovered.  He was also tested for COVID at that time and it was negative.  Denies fevers, cough, nausea, vomiting, abdominal pain, or diarrhea.  Review of Systems:  Review of Systems  All other systems reviewed and are negative.   Past Medical History:  Diagnosis Date   Aortic atherosclerosis (HCC) 01/20/2017    Aortic stenosis 01/20/2017   CAD (coronary artery disease), native coronary artery 01/20/2017   GERD (gastroesophageal reflux disease)    Hiatal hernia    Hyperlipidemia    Kidney stones    Restless leg syndrome     Past Surgical History:  Procedure Laterality Date   AORTIC VALVE REPLACEMENT N/A 01/29/2017   Procedure: AORTIC VALVE REPLACEMENT (AVR);  Surgeon: Donata Clay, Theron Arista, MD;  [facility] OR;  Service: Open Heart Surgery;  Laterality: N/A;   APPENDECTOMY     bowel obstruction surgery     CHOLECYSTECTOMY     CORONARY ARTERY BYPASS GRAFT N/A 01/29/2017   Procedure: CORONARY ARTERY BYPASS GRAFTING (CABG)  x two, using left internal mammary artery and right leg greater saphenous vein harvested endoscopically;  Surgeon: Kerin Perna, MD;  Location: [facility] OR;  Service: Open Heart Surgery;  Laterality: N/A;   KNEE ARTHROSCOPY     lap nissan     LEFT HEART CATH AND CORONARY ANGIOGRAPHY N/A 01/21/2017   Procedure: LEFT HEART CATH AND CORONARY ANGIOGRAPHY;  Surgeon: Runell Gess, MD;  Location: [facility] INVASIVE CV LAB;  Service: Cardiovascular;  Laterality: N/A;   RIGHT HEART CATH N/A 01/21/2017   Procedure: RIGHT HEART CATH;  Surgeon: Runell Gess, MD;  Location[facility] INVASIVE CV LAB;  Service: Cardiovascular;  Laterality: N/A;   SHOULDER SURGERY     TEE WITHOUT CARDIOVERSION N/A 01/29/2017   Procedure: TRANSESOPHAGEAL ECHOCARDIOGRAM (TEE);  Surgeon: Donata Clay, Theron Arista, MD;  Location: [facility] OR;  Service:  Open Heart Surgery;  Laterality: N/A;     reports that he has never smoked. He has never used smokeless tobacco. He reports that he does not drink alcohol and does not use drugs.  Allergies  Allergen Reactions   Pravastatin Nausea And Vomiting   Aspirin Nausea And Vomiting    Very heavy doses   Erythromycin Nausea And Vomiting    Unsure if there are other reactions   Fentanyl    Ibuprofen Nausea And Vomiting    Very heavy doses   Nabumetone Nausea And Vomiting   Nsaids Nausea And  Vomiting    Very heavy doses   Sulfa Antibiotics    Tetracycline Nausea And Vomiting    Unsure if there are other reactions   Elemental Sulfur Itching, Rash and Swelling   Sulfamethoxazole Itching, Rash and Swelling    Family History  Problem Relation Age of Onset   CAD Father        s/p triple bypass   Dementia Mother     Prior to Admission medications   Medication Sig Start Date End Date Taking? Authorizing Provider  ACETAMINOPHEN PO Take 1,300 mg by mouth daily.   Yes [provider]  aspirin EC 81 MG EC tablet Take 1 tablet (81 mg total) by mouth daily. 02/04/17  Yes Doree Fudge M, PA-C  atorvastatin (LIPITOR) 20 MG tablet Take 1 tablet (20 mg total) by mouth daily. 12/28/19  Yes Tobb, Kardie, DO  calcium carbonate (CALCIUM 600) 600 MG TABS tablet Take 600 mg by mouth daily.   Yes [provider]  Cholecalciferol (VITAMIN D3) 5000 units CAPS Take 5,000 Units by mouth daily.   Yes [provider]  clonazePAM (KLONOPIN) 1 MG tablet Take 1 mg by mouth at bedtime.   Yes [provider]  Coenzyme Q10-Vitamin E (QUNOL ULTRA COQ10 PO) Take 1 tablet by mouth daily.   Yes [provider]  diphenhydrAMINE (BENADRYL) 50 MG tablet Take 25 mg by mouth at bedtime.   Yes [provider]  docusate sodium (COLACE) 100 MG capsule Take 200 mg by mouth daily.   Yes [provider]  famotidine (PEPCID) 40 MG tablet Take 40 mg by mouth daily.   Yes [provider]  Flaxseed Oil OIL Take 1,000 mg by mouth daily.   Yes [provider]  gabapentin (NEURONTIN) 300 MG capsule Take 300 mg by mouth daily. 05/04/15  Yes [provider]  levothyroxine (SYNTHROID) 50 MCG tablet Take 50 mcg by mouth daily before breakfast.   Yes [provider]  MAGNESIUM PO Take 1 tablet by mouth daily.   Yes [provider]  naproxen (NAPROSYN) 500 MG tablet Take 500 mg by mouth 2 (two) times daily as needed for  moderate pain.   Yes [provider]  nitroGLYCERIN (NITROSTAT) 0.4 MG SL tablet Place 0.4 mg under the tongue every 5 (five) minutes as needed for chest pain. 06/21/15  Yes [provider]  Simethicone (GAS FREE EXTRA STRENGTH PO) Take 1 tablet by mouth as needed (gas).   Yes [provider]  vitamin B-12 (CYANOCOBALAMIN) 1000 MCG tablet Take 5,000 mcg by mouth daily.   Yes [provider]    Physical Exam: Vitals:   04/24/22 1730 04/24/22 1800 04/24/22 1830 04/24/22 1900  BP: (!) 111/52 111/76 127/67 121/66  Pulse: (!) 51 83 (!) 52 (!) 52  Resp: 13 13 12 20   Temp:   (!) 97.5 F (36.4 C)   TempSrc:  Oral   SpO2: 98% 94% 100% 100%  Weight:      Height:        Physical Exam Vitals reviewed.  Constitutional:      General: He is not in acute distress. HENT:     Head: Normocephalic and atraumatic.  Eyes:     Extraocular Movements: Extraocular movements intact.  Cardiovascular:     Rate and Rhythm: Normal rate and regular rhythm.     Pulses: Normal pulses.  Pulmonary:     Effort: Pulmonary effort is normal. No respiratory distress.     Breath sounds: Normal breath sounds. No wheezing or rales.  Abdominal:     General: Bowel sounds are normal. There is no distension.     Palpations: Abdomen is soft.     Tenderness: There is no abdominal tenderness.  Musculoskeletal:        General: No swelling or tenderness.     Cervical back: Normal range of motion.  Skin:    General: Skin is warm and dry.  Neurological:     General: No focal deficit present.     Mental Status: He is alert and oriented to person, place, and time.     Labs on Admission: I have personally reviewed following labs and imaging studies  CBC: Recent Labs  Lab 04/24/22 1505  WBC 6.1  HGB 13.4  HCT 42.3  MCV 97.2  PLT 148*   Basic Metabolic Panel: Recent Labs  Lab 04/24/22 1505  NA 139  K 4.2  CL 107  CO2 22  GLUCOSE 74  BUN 30*  CREATININE 1.25*  CALCIUM  9.0  MG 2.1   GFR: Estimated Creatinine Clearance: 50.9 mL/min (A) (by C-G formula based on SCr of 1.25 mg/dL (H)). Liver Function Tests: Recent Labs  Lab 04/24/22 1505  AST 24  ALT 19  ALKPHOS 44  BILITOT 0.6  PROT 5.9*  ALBUMIN 3.6   No results for input(s): "LIPASE", "AMYLASE" in the last 168 hours. No results for input(s): "AMMONIA" in the last 168 hours. Coagulation Profile: No results for input(s): "INR", "PROTIME" in the last 168 hours. Cardiac Enzymes: No results for input(s): "CKTOTAL", "CKMB", "CKMBINDEX", "TROPONINI" in the last 168 hours. BNP (last 3 results) No results for input(s): "PROBNP" in the last 8760 hours. HbA1C: No results for input(s): "HGBA1C" in the last 72 hours. CBG: No results for input(s): "GLUCAP" in the last 168 hours. Lipid Profile: No results for input(s): "CHOL", "HDL", "LDLCALC", "TRIG", "CHOLHDL", "LDLDIRECT" in the last 72 hours. Thyroid Function Tests: Recent Labs    04/24/22 1505  TSH 1.894   Anemia Panel: No results for input(s): "VITAMINB12", "FOLATE", "FERRITIN", "TIBC", "IRON", "RETICCTPCT" in the last 72 hours. Urine analysis:    Component Value Date/Time   COLORURINE YELLOW 01/23/2017 1637   APPEARANCEUR CLEAR 01/23/2017 1637   LABSPEC 1.021 01/23/2017 1637   PHURINE 5.0 01/23/2017 1637   GLUCOSEU NEGATIVE 01/23/2017 1637   HGBUR NEGATIVE 01/23/2017 1637   BILIRUBINUR NEGATIVE 01/23/2017 1637   KETONESUR NEGATIVE 01/23/2017 1637   PROTEINUR NEGATIVE 01/23/2017 1637   NITRITE NEGATIVE 01/23/2017 1637   LEUKOCYTESUR NEGATIVE 01/23/2017 1637    Radiological Exams on Admission: DG Chest 1 View  Result Date: 04/24/2022 CLINICAL DATA:  Palpitations and chest tenderness. EXAM: CHEST  1 VIEW COMPARISON:  03/06/2017 FINDINGS: Previous median sternotomy and CABG. Previous aortic valve replacement. Heart size is normal. No evidence of active infiltrate, mass, effusion or collapse. IMPRESSION: No active disease. Previous CABG  and aortic  valve replacement. Electronically Signed   By: Paulina FusiMark  Shogry M.D.   On: 04/24/2022 14:50    Assessment and Plan  Bradycardia Patient described palpitations prior to arrival.  However, noted to be bradycardic with heart rate 40 and systolic blood pressure was 92 with EMS.  He was given atropine and 1 L fluid bolus.  EKGs done in the ED showing sinus versus junctional bradycardia.  Currently in sinus rhythm and heart rate in the 70s. Blood pressure stable.  TSH is normal.  He is not on any AV nodal blocking agents.  Troponin negative x2 and not consistent with ACS.  Magnesium and potassium normal.  Cardiology consulted and recommended telemetry monitoring.  Continue to monitor closely.  Chest pain Patient seen by cardiology and his chest pain symptoms are felt to be atypical.  Troponin negative x2 and not consistent with ACS.  Cardiology recommending outpatient nuclear stress test.  Patient is currently chest pain-free.  No hypoxia or clinical signs of DVT to suggest PE.  Chest x-ray negative for acute finding.  Continue to monitor closely.  Mild AKI BUN 30, creatinine 1.2 (previously 0.8-1.0 on labs done 5 years ago).  Continue gentle IV fluid hydration and repeat BMP in a.m. Avoid nephrotoxic agents.  Aortic stenosis status post AVR in 2018 Cardiology recommending outpatient echocardiogram to reassess following discharge.  Hyperlipidemia Continue statin.  CAD status post CABG x2 Work-up not suggestive of ACS.  Continue aspirin and statin.  GERD Continue Pepcid.  Hypothyroidism TSH normal.  Continue Synthroid.  Anxiety Continue Klonopin.  Chronic pain Continue gabapentin.  DVT prophylaxis: Lovenox Code Status: Full Code (discussed with the patient) Family Communication: No family available at this time. Consults called: Cardiology Level of care: Telemetry bed Admission status: It is my clinical opinion that referral for OBSERVATION is reasonable and necessary in this  patient based on the above information provided. The aforementioned taken together are felt to place the patient at high risk for further clinical deterioration. However, it is anticipated that the patient may be medically stable for discharge from the hospital within 24 to 48 hours.   John GiovanniVasundhra Dynasia Kercheval MD [facility]  If 7PM-7AM, please contact night-coverage [email or web address]  04/24/2022, 7:27 PM

## 2022-04-24 NOTE — ED Notes (Signed)
Pt given crackers and drink 

## 2022-04-24 NOTE — Consult Note (Signed)
Cardiology Consultation   Patient ID: Jordan Hammond MRN: [id number]; DOB: 22-Feb-1941  Admit date: 04/24/2022 Date of Consult: 04/24/2022  PCP:  Norm Parcel, DO   [facility] Providers Cardiologist:  Dr Servando Salina   Patient Profile:   CHARLE Hammond is a 81 y.o. male with a hx of coronary artery disease, hyperlipidemia, aortic stenosis who is being seen 04/24/2022 for the evaluation of chest pain at the request of Gloris Manchester, MD.  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  Last echocardiogram October 2018 showed normal LV function and normally functioning prosthetic aortic valve.  Patient typically does not have dyspnea on exertion, orthopnea, PND, pedal edema, exertional chest pain or syncope.  He has been followed at [facility] but moved back to this area 3 weeks ago.  At 930 this morning he developed chest discomfort.  It was in the left chest area and occurred with his heartbeat.  He felt his heart racing and with each beat he felt discomfort.  There is no associated nausea, dyspnea or diaphoresis.  His pain persisted and he presented to the emergency room for further evaluation.  Note he has not had presyncope or syncope.  Cardiology now asked to further evaluate.   Past Medical History:  Diagnosis Date   Aortic atherosclerosis (HCC) 01/20/2017   Aortic stenosis 01/20/2017   CAD (coronary artery disease), native coronary artery 01/20/2017   GERD (gastroesophageal reflux disease)    Hiatal hernia    Hyperlipidemia    Kidney stones    Restless leg syndrome     Past Surgical History:  Procedure Laterality Date   AORTIC VALVE REPLACEMENT N/A 01/29/2017   Procedure: AORTIC VALVE REPLACEMENT (AVR);  Surgeon: Donata Clay, Theron Arista, MD;  Location: [facility] OR;  Service: Open Heart Surgery;   Laterality: N/A;   APPENDECTOMY     bowel obstruction surgery     CHOLECYSTECTOMY     CORONARY ARTERY BYPASS GRAFT N/A 01/29/2017   Procedure: CORONARY ARTERY BYPASS GRAFTING (CABG)  x two, using left internal mammary artery and right leg greater saphenous vein harvested endoscopically;  Surgeon: Kerin Perna, MD;  Location: [facility] OR;  Service: Open Heart Surgery;  Laterality: N/A;   KNEE ARTHROSCOPY     lap nissan     LEFT HEART CATH AND CORONARY ANGIOGRAPHY N/A 01/21/2017   Procedure: LEFT HEART CATH AND CORONARY ANGIOGRAPHY;  Surgeon: Runell Gess, MD;  Location: [facility] INVASIVE CV LAB;  Service: Cardiovascular;  Laterality: N/A;   RIGHT HEART CATH N/A 01/21/2017   Procedure: RIGHT HEART CATH;  Surgeon: Runell Gess, MD;  Location: [facility] INVASIVE CV LAB;  Service: Cardiovascular;  Laterality: N/A;   SHOULDER SURGERY     TEE WITHOUT CARDIOVERSION N/A 01/29/2017   Procedure: TRANSESOPHAGEAL ECHOCARDIOGRAM (TEE);  Surgeon: Donata Clay, Theron Arista, MD;  Location: [facility] OR;  Service: Open Heart Surgery;  Laterality: N/A;      Allergies:    Allergies  Allergen Reactions   Pravastatin Nausea And Vomiting   Aspirin Nausea And Vomiting    Very heavy doses   Erythromycin Nausea And Vomiting    Unsure if there are other reactions   Fentanyl    Ibuprofen Nausea And Vomiting    Very heavy doses   Nabumetone Nausea And Vomiting  Nsaids Nausea And Vomiting    Very heavy doses   Sulfa Antibiotics    Tetracycline Nausea And Vomiting    Unsure if there are other reactions   Elemental Sulfur Itching, Rash and Swelling   Sulfamethoxazole Itching, Rash and Swelling    Social History:   Social History   Socioeconomic History   Marital status: Married    Spouse name: Not on file   Number of children: Not on file   Years of education: Not on file   Highest education level: Not on file  Occupational History   Not on file  Tobacco Use   Smoking status: Never   Smokeless tobacco: Never  Vaping  Use   Vaping Use: Never used  Substance and Sexual Activity   Alcohol use: No   Drug use: No   Sexual activity: Yes  Other Topics Concern   Not on file  Social History Narrative   Divorced, remarried.  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  No fevers, chills, productive cough. All other ROS reviewed and negative.     Physical Exam/Data:   Vitals:   04/24/22 1600 04/24/22 1700 04/24/22 1730 04/24/22 1800  BP: 104/60 113/63 (!) 111/52 111/76  Pulse: (!) 52 (!) 50 (!) 51 83  Resp: 14 12 13 13   Temp:      SpO2: 100% 100% 98% 94%  Weight:      Height:       No intake or output data in the 24 hours ending 04/24/22 1810    04/24/2022    3:48 PM 10/08/2018    1:49 PM 05/07/2018    8:25 AM  Last 3 Weights  Weight (lbs) 195 lb 188 lb 187 lb 12.8 oz  Weight (kg) 88.451 kg 85.276 kg 85.186 kg     Body mass index is 26.45 kg/m.  General:  Well nourished, well developed, in no acute distress HEENT: normal Neck: no JVD Vascular: No carotid bruits; Distal pulses 2+ bilaterally Cardiac:  normal S1, S2; RRR; 2/6 systolic murmur left sternal border.  No diastolic murmur noted. Lungs:  Hammond to auscultation bilaterally, no wheezing, rhonchi or rales  Abd: soft, nontender, no hepatomegaly  Ext: no edema Musculoskeletal:  No deformities, BUE and BLE strength normal and equal Skin: warm and dry  Neuro:  CNs 2-12 intact, no focal abnormalities noted Psych:  Normal affect   EKG:  The EKG was personally  reviewed and demonstrates: Normal sinus rhythm with no ST changes. Telemetry:  Telemetry was personally reviewed and demonstrates: Sinus bradycardia.   Laboratory Data:  High Sensitivity Troponin:   Recent Labs  Lab 04/24/22 1505 04/24/22 1637  TROPONINIHS 5 4     Chemistry Recent Labs  Lab 04/24/22 1505  NA 139  K 4.2  CL 107  CO2 22  GLUCOSE 74  BUN 30*  CREATININE 1.25*  CALCIUM 9.0  GFRNONAA 58*  ANIONGAP 10    Recent Labs  Lab 04/24/22 1505  PROT 5.9*  ALBUMIN 3.6  AST 24  ALT 19  ALKPHOS 44  BILITOT 0.6   Hematology Recent Labs  Lab 04/24/22 1505  WBC 6.1  RBC 4.35  HGB 13.4  HCT 42.3  MCV 97.2  MCH 30.8  MCHC 31.7  RDW 12.8  PLT 148*   Thyroid  Recent Labs  Lab 04/24/22 1505  TSH 1.894    Radiology/Studies:  DG Chest 1 View  Result Date: 04/24/2022 CLINICAL DATA:  Palpitations and chest tenderness. EXAM: CHEST  1 VIEW COMPARISON:  03/06/2017 FINDINGS: Previous median sternotomy and CABG. Previous aortic valve replacement. Heart size is normal. No evidence of active infiltrate, mass, effusion or collapse. IMPRESSION: No active disease. Previous CABG and aortic valve replacement. Electronically Signed   By: Paulina Fusi M.D.   On: 04/24/2022 14:50     Assessment and Plan:   Chest pain-symptoms are extremely atypical.  Electrocardiogram shows no ST changes.  Initial troponin is normal.  Would admit and follow-up troponins and if negative patient can be discharged with outpatient stress nuclear study Status post aortic valve replacement-we will plan outpatient echocardiogram to reassess following discharge. Hyperlipidemia-continue statin. Coronary artery disease status post coronary artery bypass and graft-plan to continue medical therapy with aspirin and statin. Palpitations-patient describes heart palpitations prior to arrival.  We discussed purchasing an Apple watch to record rhythm strips when he has symptoms.  We will also follow on  telemetry.   For questions or updates, please contact [facility] Please consult [email or web address] for contact info under    Signed, Olga Millers, MD  04/24/2022 6:10 PM

## 2022-04-24 NOTE — ED Triage Notes (Signed)
Pt states that he was at home and began having palpitations and weakness. Pt denies any SOB, no CP. Pt initial SBP was 92 and HR 40. Given 1 of Atropine and HR improved to 60. Given 1 L NS Bolus through #18 R AC.   Pt had taken 3 SL NTG PTA and 324 mg ASA prior to EMS arrival.

## 2022-04-24 NOTE — ED Provider Triage Note (Addendum)
Emergency Medicine Provider Triage Evaluation Note  Jordan Hammond , a 81 y.o. male  was evaluated in triage.  Pt complains of chest pain since 10 AM that was abrupt on onset, and felt like heart was racing. Reports radiating pain to left arm. Took nitroglycerin with some relief. Reports mild chest pain currently. Tender under L breast. No SOB.  Hx of CABG, and aortic valve replacement. No blood thinners. Takes daily aspirin.   Review of Systems  Positive: Chest pain Negative: SOB  Physical Exam  BP (!) 100/58 (BP Location: Right Arm)   Pulse (!) 58   Temp (!) 97.4 F (36.3 C)   Resp 18   SpO2 100%  Gen:   Awake, no distress   Resp:  Normal effort  MSK:   Moves extremities without difficulty. TTP under left breast.  Medical Decision Making  Medically screening exam initiated at 2:18 PM.  Appropriate orders placed.  Jordan Hammond was informed that the remainder of the evaluation will be completed by another provider, this initial triage assessment does not replace that evaluation, and the importance of remaining in the ED until their evaluation is complete.     Pete Pelt, [place] 04/24/22 1422  Reports recent illness w/flu.    Pete Pelt, [place] 04/24/22 1424

## 2022-04-25 ENCOUNTER — Encounter (HOSPITAL_COMMUNITY): Payer: Self-pay | Admitting: Internal Medicine

## 2022-04-25 ENCOUNTER — Other Ambulatory Visit: Payer: Self-pay | Admitting: Cardiology

## 2022-04-25 ENCOUNTER — Telehealth (HOSPITAL_COMMUNITY): Payer: Self-pay | Admitting: *Deleted

## 2022-04-25 DIAGNOSIS — R072 Precordial pain: Secondary | ICD-10-CM | POA: Diagnosis not present

## 2022-04-25 DIAGNOSIS — N179 Acute kidney failure, unspecified: Secondary | ICD-10-CM

## 2022-04-25 DIAGNOSIS — R079 Chest pain, unspecified: Secondary | ICD-10-CM

## 2022-04-25 DIAGNOSIS — I25118 Atherosclerotic heart disease of native coronary artery with other forms of angina pectoris: Secondary | ICD-10-CM

## 2022-04-25 DIAGNOSIS — Z951 Presence of aortocoronary bypass graft: Secondary | ICD-10-CM

## 2022-04-25 DIAGNOSIS — E782 Mixed hyperlipidemia: Secondary | ICD-10-CM

## 2022-04-25 DIAGNOSIS — R000 Tachycardia, unspecified: Secondary | ICD-10-CM | POA: Diagnosis not present

## 2022-04-25 DIAGNOSIS — K279 Peptic ulcer, site unspecified, unspecified as acute or chronic, without hemorrhage or perforation: Secondary | ICD-10-CM | POA: Diagnosis not present

## 2022-04-25 DIAGNOSIS — Z952 Presence of prosthetic heart valve: Secondary | ICD-10-CM | POA: Diagnosis not present

## 2022-04-25 DIAGNOSIS — R001 Bradycardia, unspecified: Secondary | ICD-10-CM | POA: Diagnosis not present

## 2022-04-25 DIAGNOSIS — E785 Hyperlipidemia, unspecified: Secondary | ICD-10-CM | POA: Diagnosis not present

## 2022-04-25 DIAGNOSIS — I350 Nonrheumatic aortic (valve) stenosis: Secondary | ICD-10-CM

## 2022-04-25 DIAGNOSIS — Z9889 Other specified postprocedural states: Secondary | ICD-10-CM

## 2022-04-25 LAB — URINALYSIS, ROUTINE W REFLEX MICROSCOPIC
Bilirubin Urine: NEGATIVE
Glucose, UA: NEGATIVE mg/dL
Hgb urine dipstick: NEGATIVE
Ketones, ur: NEGATIVE mg/dL
Leukocytes,Ua: NEGATIVE
Nitrite: NEGATIVE
Protein, ur: NEGATIVE mg/dL
Specific Gravity, Urine: 1.015 (ref 1.005–1.030)
pH: 6 (ref 5.0–8.0)

## 2022-04-25 LAB — CBC WITH DIFFERENTIAL/PLATELET
Abs Immature Granulocytes: 0.02 10*3/uL (ref 0.00–0.07)
Basophils Absolute: 0 10*3/uL (ref 0.0–0.1)
Basophils Relative: 1 %
Eosinophils Absolute: 0.1 10*3/uL (ref 0.0–0.5)
Eosinophils Relative: 3 %
HCT: 38.3 % — ABNORMAL LOW (ref 39.0–52.0)
Hemoglobin: 12.9 g/dL — ABNORMAL LOW (ref 13.0–17.0)
Immature Granulocytes: 0 %
Lymphocytes Relative: 40 %
Lymphs Abs: 1.8 10*3/uL (ref 0.7–4.0)
MCH: 31.4 pg (ref 26.0–34.0)
MCHC: 33.7 g/dL (ref 30.0–36.0)
MCV: 93.2 fL (ref 80.0–100.0)
Monocytes Absolute: 0.5 10*3/uL (ref 0.1–1.0)
Monocytes Relative: 10 %
Neutro Abs: 2 10*3/uL (ref 1.7–7.7)
Neutrophils Relative %: 46 %
Platelets: 148 10*3/uL — ABNORMAL LOW (ref 150–400)
RBC: 4.11 MIL/uL — ABNORMAL LOW (ref 4.22–5.81)
RDW: 12.9 % (ref 11.5–15.5)
WBC: 4.5 10*3/uL (ref 4.0–10.5)
nRBC: 0 % (ref 0.0–0.2)

## 2022-04-25 LAB — BASIC METABOLIC PANEL
Anion gap: 6 (ref 5–15)
BUN: 27 mg/dL — ABNORMAL HIGH (ref 8–23)
CO2: 26 mmol/L (ref 22–32)
Calcium: 8.9 mg/dL (ref 8.9–10.3)
Chloride: 107 mmol/L (ref 98–111)
Creatinine, Ser: 1.31 mg/dL — ABNORMAL HIGH (ref 0.61–1.24)
GFR, Estimated: 55 mL/min — ABNORMAL LOW (ref >60)
Glucose, Bld: 89 mg/dL (ref 70–99)
Potassium: 4.3 mmol/L (ref 3.5–5.1)
Sodium: 139 mmol/L (ref 135–145)

## 2022-04-25 LAB — CREATININE, URINE, RANDOM: Creatinine, Urine: 78 mg/dL

## 2022-04-25 LAB — MAGNESIUM: Magnesium: 2 mg/dL (ref 1.7–2.4)

## 2022-04-25 LAB — PHOSPHORUS: Phosphorus: 4 mg/dL (ref 2.5–4.6)

## 2022-04-25 LAB — SODIUM, URINE, RANDOM: Sodium, Ur: 143 mmol/L

## 2022-04-25 MED ORDER — SODIUM CHLORIDE 0.9 % IV SOLN: INTRAVENOUS | Status: DC

## 2022-04-25 NOTE — Discharge Summary (Signed)
Physician Discharge Summary   Patient: Jordan Hammond MRN: [id number] DOB: 11-22-1940  Admit date:     04/24/2022  Discharge date: 04/25/22  Discharge Physician: Jordan Merles, DO   PCP: Jordan Hoist III, DO   Recommendations at discharge:   Follow-up with PCP within 1 to 2 weeks and repeat CBC, CMP, mag, Phos within 1 week Follow-up with cardiology within 1 to 2 weeks and have outpatient stress test and echocardiogram done  Discharge Diagnoses: Principal Problem:   Bradycardia Active Problems:   Aortic stenosis   CAD (coronary artery disease), native coronary artery   Chest pain   S/P AVR (aortic valve replacement)   Hyperlipidemia   Status post coronary artery bypass graft   GERD (gastroesophageal reflux disease)   PUD (peptic ulcer disease)   AKI (acute kidney injury) (HCC)   Hypothyroidism  Resolved Problems:   * No resolved hospital problems. [facility] Course: HPI per Dr. John Hammond HPI: [name] is a 81 y.o. male with medical history significant of CAD status post CABG x2, hyperlipidemia, aortic stenosis status post AVR in 2018, GERD, hypothyroidism, anxiety presented to the ED with complaints of chest pain and palpitations.  Patient's systolic blood pressure was 92 and heart rate 40 with EMS.  He was given atropine with improvement of heart rate to 60s.  Also given 1 L normal saline bolus.  Patient had taken 3 nitroglycerin and 324 mg aspirin prior to EMS arrival.  In the ED, heart rate noted to be in the 50s.  EKGs showing sinus rhythm versus junctional bradycardia and no acute ischemic changes.  Blood pressure stable.  Troponin negative x2.  TSH normal.  Chest x-ray showing no active disease. Patient was given GI cocktail, morphine, and Pepcid. Cardiology consulted.  [facility] called to admit.   Patient states this morning he climbed a ladder to do some work in his house.  He then sat down and had breakfast and afterwards started experiencing sudden onset  substernal chest tightness and his heart was racing.  He was also having episodes of intermittent sharp left-sided chest pain every few seconds.  He took 2 nitroglycerins and aspirin.  Symptoms persisted despite nitroglycerin and his wife called his cardiothoracic surgeons office and they advised coming to the emergency room.  At present, he reports feeling well and his symptoms have resolved.  He reports history of flu 2 weeks ago but has recovered.  He was also tested for COVID at that time and it was negative.  Denies fevers, cough, nausea, vomiting, abdominal pain, or diarrhea.  **Interim History Cardiology was consulted and felt the patient was stable for discharge with outpatient echocardiogram and nuclear medicine stress test.  Patient remained a little bit bradycardic however he was asymptomatic.  Patient was deemed stable for discharge and ambulated without issues and denies any shortness of breath, chest pain and felt back to his baseline.  Plan is for outpatient echo relatively soon and nuclear medicine stress test on Monday.  Assessment and Plan: No notes have been filed under this hospital service. Service: Hospitalist  Bradycardia Patient described palpitations prior to arrival.  However, noted to be bradycardic with heart rate 40 and systolic blood pressure was 92 with EMS.  He was given atropine and 1 L fluid bolus.  EKGs done in the ED showing sinus versus junctional bradycardia.  Currently in sinus rhythm and heart rate in the 70s. Blood pressure stable.  TSH is normal.  He is not on  any AV nodal blocking agents.  Troponin negative x2 and not consistent with ACS.  Magnesium and potassium normal.  Cardiology consulted and recommended telemetry monitoring and given that he is asymptomatic they recommended outpatient echocardiogram and nuclear medicine stress testing   Chest pain improved, Patient seen by cardiology and his chest pain symptoms are felt to be atypical.  Troponin negative  x2 and not consistent with ACS.  Cardiology recommending outpatient nuclear stress test and echocardiogram.  Patient is currently chest pain-free.  No hypoxia or clinical signs of DVT to suggest PE.  Chest x-ray negative for acute finding.  Continue to monitor closely.   Mild AKI BUN 30, creatinine 1.2 (previously 0.8-1.0 on labs done 5 years ago).  Continue gentle IV fluid hydration and repeat BMP in a.m. Avoid nephrotoxic agents.  And follow creatinine outpatient setting with PCP   Aortic stenosis status post AVR in 2018 Cardiology recommending outpatient echocardiogram to reassess following discharge.   Hyperlipidemia Continue statin.   CAD status post CABG x2 Work-up not suggestive of ACS.  Continue aspirin and statin.   GERD Continue Pepcid.   Hypothyroidism TSH normal.  Continue Synthroid.   Anxiety Continue Klonopin.   Chronic pain Continue gabapentin.   Consultants: Cardiology Procedures performed: None Disposition: Home Diet recommendation:  Cardiac diet DISCHARGE MEDICATION: Allergies as of 04/25/2022       Reactions   Pravastatin Nausea And Vomiting   Aspirin Nausea And Vomiting   Very heavy doses   Erythromycin Nausea And Vomiting   Unsure if there are other reactions   Fentanyl    Ibuprofen Nausea And Vomiting   Very heavy doses   Nabumetone Nausea And Vomiting   Nsaids Nausea And Vomiting   Very heavy doses   Sulfa Antibiotics    Tetracycline Nausea And Vomiting   Unsure if there are other reactions   Elemental Sulfur Itching, Rash, Swelling   Sulfamethoxazole Itching, Rash, Swelling        Medication List     STOP taking these medications    naproxen 500 MG tablet Commonly known as: NAPROSYN       TAKE these medications    ACETAMINOPHEN PO Take 1,300 mg by mouth daily.   aspirin EC 81 MG tablet Take 1 tablet (81 mg total) by mouth daily.   atorvastatin 20 MG tablet Commonly known as: LIPITOR Take 1 tablet (20 mg total) by  mouth daily.   Calcium 600 600 MG Tabs tablet Generic drug: calcium carbonate Take 600 mg by mouth daily.   clonazePAM 1 MG tablet Commonly known as: KLONOPIN Take 1 mg by mouth at bedtime.   cyanocobalamin 1000 MCG tablet Commonly known as: VITAMIN B12 Take 5,000 mcg by mouth daily.   diphenhydrAMINE 50 MG tablet Commonly known as: BENADRYL Take 25 mg by mouth at bedtime.   docusate sodium 100 MG capsule Commonly known as: COLACE Take 200 mg by mouth daily.   famotidine 40 MG tablet Commonly known as: PEPCID Take 40 mg by mouth daily.   Flaxseed Oil Oil Take 1,000 mg by mouth daily.   gabapentin 300 MG capsule Commonly known as: NEURONTIN Take 300 mg by mouth daily.   GAS FREE EXTRA STRENGTH PO Take 1 tablet by mouth as needed (gas).   levothyroxine 50 MCG tablet Commonly known as: SYNTHROID Take 50 mcg by mouth daily before breakfast.   MAGNESIUM PO Take 1 tablet by mouth daily.   nitroGLYCERIN 0.4 MG SL tablet Commonly known as: NITROSTAT Place 0.4  mg under the tongue every 5 (five) minutes as needed for chest pain.   QUNOL ULTRA COQ10 PO Take 1 tablet by mouth daily.   Vitamin D3 125 MCG (5000 UT) Caps Take 5,000 Units by mouth daily.       Discharge Exam: Filed Weights   04/24/22 1548  Weight: 88.5 kg   Vitals:   04/25/22 1005 04/25/22 1253  BP: 91/63 122/72  Pulse: (!) 58 (!) 48  Resp: (!) 33 18  Temp: 97.7 F (36.5 C) 97.7 F (36.5 C)  SpO2: 98% 94%   Examination: Physical Exam:  Constitutional: WN/WD, overweight Caucasian male currently no acute distress appears calm Respiratory: Slightly diminished to auscultation bilaterally, no wheezing, rales, rhonchi or crackles. Normal respiratory effort and patient is not tachypenic. No accessory muscle use.  Unlabored breathing Cardiovascular: Slightly bradycardic but regular rhythm, no murmurs / rubs / gallops. S1 and S2 auscultated. No extremity edema. 2+ pedal pulses. No carotid bruits.   Abdomen: Soft, non-tender, slightly distended secondary to body habitus. No masses palpated. No appreciable hepatosplenomegaly. Bowel sounds positive.  GU: Deferred. Musculoskeletal: No clubbing / cyanosis of digits/nails. No joint deformity upper and lower extremities.  Skin: No rashes, lesions, ulcers on limited skin evaluation. No induration; Warm and dry.  Neurologic: CN 2-12 grossly intact with no focal deficits. Romberg sign and cerebellar reflexes not assessed.  Psychiatric: Normal judgment and insight. Alert and oriented x 3. Normal mood and appropriate affect.   Condition at discharge: stable  The results of significant diagnostics from this hospitalization (including imaging, microbiology, ancillary and laboratory) are listed below for reference.   Imaging Studies: DG Chest 1 View  Result Date: 04/24/2022 CLINICAL DATA:  Palpitations and chest tenderness. EXAM: CHEST  1 VIEW COMPARISON:  03/06/2017 FINDINGS: Previous median sternotomy and CABG. Previous aortic valve replacement. Heart size is normal. No evidence of active infiltrate, mass, effusion or collapse. IMPRESSION: No active disease. Previous CABG and aortic valve replacement. Electronically Signed   By: Paulina Fusi M.D.   On: 04/24/2022 14:50    Microbiology: Results for orders placed or performed during the hospital encounter of 01/19/17  Surgical pcr screen     Status: None   Collection Time: 01/23/17 12:37 PM   Specimen: Nasal Mucosa; Nasal Swab  Result Value Ref Range Status   MRSA, PCR NEGATIVE NEGATIVE Final   Staphylococcus aureus NEGATIVE NEGATIVE Final    Comment:        The Xpert SA Assay (FDA approved for NASAL specimens in patients over 63 years of age), is one component of a comprehensive surveillance program.  Test performance has been validated by [facility] for patients greater than or equal to 50 year old. It is not intended to diagnose infection nor to guide or monitor treatment.     Labs: CBC: Recent Labs  Lab 04/24/22 1505 04/25/22 0400  WBC 6.1 4.5  NEUTROABS  --  2.0  HGB 13.4 12.9*  HCT 42.3 38.3*  MCV 97.2 93.2  PLT 148* 148*   Basic Metabolic Panel: Recent Labs  Lab 04/24/22 1505 04/25/22 0400  NA 139 139  K 4.2 4.3  CL 107 107  CO2 22 26  GLUCOSE 74 89  BUN 30* 27*  CREATININE 1.25* 1.31*  CALCIUM 9.0 8.9  MG 2.1 2.0  PHOS  --  4.0   Liver Function Tests: Recent Labs  Lab 04/24/22 1505  AST 24  ALT 19  ALKPHOS 44  BILITOT 0.6  PROT 5.9*  ALBUMIN  3.6   CBG: No results for input(s): "GLUCAP" in the last 168 hours.  Discharge time spent: greater than 30 minutes.  Signed: Marguerita Merles, DO [facility] 04/25/2022

## 2022-04-25 NOTE — ED Notes (Signed)
Called lab to have add on labs added to previous specimens

## 2022-04-25 NOTE — ED Notes (Signed)
Pt arrived to room at this time

## 2022-04-25 NOTE — Progress Notes (Signed)
Received notification from hospitalist and patient's nurse that upon her arrival this morning, patient's spouse had concerns about cardiology plans/management. Both Dr. Jens Som and myself had already seen the patient this morning and deemed him stable for discharge with outpatient nuclear stress test and echocardiogram scheduled.   Upon my return to the room, patient continued to report that he had no recurrence of chest pain, palpitations, shortness of breath. He expressed a desire to continue with previously discussed plans for discharge. I spend approximately 30 minutes in the room discussing plans, answering questions, and providing reassurance to patient, spouse, and their daughter. I also did offer in-patient stress testing and echocardiogram (with the explanation that this would necessitate an extra night in the hospital) but reiterated that Dr. Jens Som and I both felt that he was stable for discharge with outpatient workup. Ultimately, patient and family expressed comfort with the original plan for discharge. This was communicated to patient's primary team/hospitalist. Patient's follow up appointment, nuclear stress test, and echocardiogram have all been scheduled.

## 2022-04-25 NOTE — ED Notes (Signed)
Sheikh MD at bedside

## 2022-04-25 NOTE — Progress Notes (Signed)
PT Cancellation Note  Patient Details Name: Jordan Hammond MRN: [id number] DOB: [birth year]   Cancelled Treatment:    Reason Eval/Treat Not Completed: PT screened, no needs identified, will sign off. Per discussion with RN and the pt, the pt is back to baseline, ambulating independently. Pt in agreement that there is no need for PT consult at this time. Acute PT signing off.   Arlyss Gandy 04/25/2022, 1:09 PM

## 2022-04-25 NOTE — ED Notes (Addendum)
Cardiology PA at bedside. 

## 2022-04-25 NOTE — Progress Notes (Addendum)
Rounding Note    Patient Name: Jordan Hammond Date of Encounter: 04/25/2022  [facility] Cardiologist: Dr. Jens Som  Subjective   Patient resting comfortably in bed this morning. Reports no re-occurrence of chest pain that prompted his presentation to the ED. He also denies dyspnea/orthopnea, nausea, or diaphoresis.  Inpatient Medications    Scheduled Meds:  aspirin EC  81 mg Oral Daily   atorvastatin  20 mg Oral Daily   clonazePAM  1 mg Oral QHS   enoxaparin (LOVENOX) injection  40 mg Subcutaneous Q24H   famotidine  40 mg Oral Daily   gabapentin  300 mg Oral Daily   levothyroxine  50 mcg Oral Q0600   Continuous Infusions:  sodium chloride 75 mL/hr at 04/24/22 2030   PRN Meds: acetaminophen **OR** acetaminophen   Vital Signs    Vitals:   04/25/22 0400 04/25/22 0500 04/25/22 0621 04/25/22 0700  BP: 119/64 (!) 110/59  113/74  Pulse: 61 (!) 50  (!) 55  Resp: 17 13  16   Temp:   97.6 F (36.4 C)   TempSrc:   Oral   SpO2: 99% 99%  99%  Weight:      Height:        Intake/Output Summary (Last 24 hours) at 04/25/2022 0742 Last data filed at 04/24/2022 1920 Gross per 24 hour  Intake 50 ml  Output --  Net 50 ml      04/24/2022    3:48 PM 10/08/2018    1:49 PM 05/07/2018    8:25 AM  Last 3 Weights  Weight (lbs) 195 lb 188 lb 187 lb 12.8 oz  Weight (kg) 88.451 kg 85.276 kg 85.186 kg      Telemetry    Sinus rhythm, sinus bradycardia. Telemetry review limited as patient moved to a different room overnight, old tele did not transfer - Personally Reviewed  ECG    No new tracing  Physical Exam   GEN: No acute distress.   Neck: No JVD Cardiac: RRR, no murmurs, rubs, or gallops.  Respiratory: Clear to auscultation bilaterally. GI: Soft, nontender, non-distended  MS: No edema; No deformity. Neuro:  Nonfocal  Psych: Normal affect   Labs    High Sensitivity Troponin:   Recent Labs  Lab 04/24/22 1505 04/24/22 1637  TROPONINIHS 5 4      Chemistry Recent Labs  Lab 04/24/22 1505 04/25/22 0400  NA 139 139  K 4.2 4.3  CL 107 107  CO2 22 26  GLUCOSE 74 89  BUN 30* 27*  CREATININE 1.25* 1.31*  CALCIUM 9.0 8.9  MG 2.1  --   PROT 5.9*  --   ALBUMIN 3.6  --   AST 24  --   ALT 19  --   ALKPHOS 44  --   BILITOT 0.6  --   GFRNONAA 58* 55*  ANIONGAP 10 6    Lipids No results for input(s): "CHOL", "TRIG", "HDL", "LABVLDL", "LDLCALC", "CHOLHDL" in the last 168 hours.  Hematology Recent Labs  Lab 04/24/22 1505  WBC 6.1  RBC 4.35  HGB 13.4  HCT 42.3  MCV 97.2  MCH 30.8  MCHC 31.7  RDW 12.8  PLT 148*   Thyroid  Recent Labs  Lab 04/24/22 1505  TSH 1.894    BNPNo results for input(s): "BNP", "PROBNP" in the last 168 hours.  DDimer No results for input(s): "DDIMER" in the last 168 hours.   Radiology    DG Chest 1 View  Result Date: 04/24/2022 CLINICAL  DATA:  Palpitations and chest tenderness. EXAM: CHEST  1 VIEW COMPARISON:  03/06/2017 FINDINGS: Previous median sternotomy and CABG. Previous aortic valve replacement. Heart size is normal. No evidence of active infiltrate, mass, effusion or collapse. IMPRESSION: No active disease. Previous CABG and aortic valve replacement. Electronically Signed   By: Paulina Fusi M.D.   On: 04/24/2022 14:50    Cardiac Studies   01/21/2017 LHC  Ost LM lesion, 70 %stenosed. Mid LAD lesion, 70 %stenosed. Ost Cx to Prox Cx lesion, 50 %stenosed. Ost 1st Mrg to 1st Mrg lesion, 80 %stenosed.  Patient Profile     Jordan Hammond is a 81 y.o. male with a hx of coronary artery disease, hyperlipidemia, aortic stenosis who is being seen 04/24/2022 for the evaluation of chest pain at the request of Gloris Manchester, MD.   Assessment & Plan    CAD  Patient with history of CABG and graft with LIMA to LAD, saphenous vein graft to OM presented to the ED after experiencing chest pain on the morning of 11/14. Pain was "rhythmic" and occurred with every heart beat.   In the ED, troponin  remained negative: 5, 4 ECG without acute ischemic changes No recurrence of pain since arrival. Given reassuring workup and atypical symptoms, will plan for outpatient nuclear stress test.  Continue home med regimen including ASA, Atorvastatin   Hyperlipidemia  Continue home statin, would be reasonable to consider increasing dose from 20 to 40mg  QD.   Palpitations  Patient reporting palpitations around the time chest pain began yesterday. No recurrence since admission. Yesterday, discussed purchasing an Apple watch or KardiaMobile. Telemetry shows no tachy-arrhythmias.    AKI  Patient with creatinine elevated to 1.31 this morning. Reports some urinary urgency overnight. Management per primary team.  Aortic valve replacement  Patient is s/p valve replacement with pericardial tissue in 2018. 2018 TTE showed normal valve function. Plan to obtain outpatient TTE to monitor valve function.   Hypothyroidism  Continue home Synthroid 2019    For questions or updates, please contact [facility] Please consult [email or web address] for contact info under   Signed, , PA-C  04/25/2022, 7:42 AM   As above, patient seen and examined.  He has had no further chest pain.  Troponins are normal.  Patient can be discharged today and we will arrange outpatient stress nuclear study and echocardiogram.  I have personally reviewed his telemetry and it shows sinus rhythm.  If he has further palpitations he understands to record a strip with his Apple watch.  He will see an APP back in 2 to 4 weeks and me in 3 months.  We will sign off.  Please call with questions. 04/27/2022

## 2022-04-25 NOTE — Discharge Instructions (Signed)
   You have a Stress Test scheduled at [facility]. Your doctor has ordered this test to check the blood flow in your heart arteries.  Please arrive 15 minutes early for paperwork. The whole test will take several hours. You may want to bring reading material to remain occupied while undergoing different parts of the test.  Instructions: No food/drink after midnight the night before. It is OK to take your morning meds with a sip of water EXCEPT for those types of medicines listed below or otherwise instructed. No caffeine/decaf products 24 hours before, including medicines such as Excedrin or Goody Powders. Call if there are any questions.  Wear comfortable clothes and shoes.   Special Medication Instructions: Beta blockers such as metoprolol (Lopressor/Toprol XL), atenolol (Tenormin), carvedilol (Coreg), nebivolol (Bystolic), bisoprolol (Zebeta), propranolol (Inderal) should not be taken for 24 hours before the test. Calcium channel blockers such as diltiazem (Cardizem) or verapmil (Calan) should not be taken for 24 hours before the test. Remove nitroglycerin patches and do not take nitrate preparations such as Imdur/isosorbide the day of your test. No Persantine/Theophylline or Aggrenox medicines should be used within 24 hours of the test.  If you are diabetic, please ask which medications to hold the day of the test.  What To Expect: When you arrive in the lab, the technician will inject a small amount of radioactive tracer into your arm through an IV while you are resting quietly. This helps us to form pictures of your heart. You will likely only feel a sting from the IV. After a waiting period, resting pictures will be obtained under a big camera. These are the "before" pictures.  Next, you will be prepped for the stress portion of the test. This may include either walking on a treadmill or receiving a medicine that helps to dilate blood vessels in your heart to simulate the  effect of exercise on your heart. If you are walking on a treadmill, you will walk at different paces to try to get your heart rate to a goal number that is based on your age. If your doctor has chosen the pharmacologic test, then you will receive a medicine through your IV that may cause temporary nausea, flushing, shortness of breath and sometimes chest discomfort or vomiting. This is typically short-lived and usually resolves quickly. If you experience symptoms, that does not automatically mean the test is abnormal. Some patients do not experience any symptoms at all. Your blood pressure and heart rate will be monitored, and we will be watching your EKG on a computer screen for any changes. During this portion of the test, the radiologist will inject another small amount of radioactive tracer into your IV. After a waiting period, you will undergo a second set of pictures. These are the "after" pictures.  The doctor reading the test will compare the before-and-after images to look for evidence of heart blockages or heart weakness. The test usually takes 1 day to complete, but in certain instances (for example, if a patient is over a certain weight limit), the test may be done over the span of 2 days.      

## 2022-04-25 NOTE — Telephone Encounter (Signed)
Left message on voicemail per DPR in reference to upcoming appointment scheduled on 04/30/2022 at 10:45 with detailed instructions given per Myocardial Perfusion Study Information Sheet for the test. LM to arrive 15 minutes early, and that it is imperative to arrive on time for appointment to keep from having the test rescheduled. If you need to cancel or reschedule your appointment, please call the office within 24 hours of your appointment. Failure to do so may result in a cancellation of your appointment, and a $50 no show fee. Phone number given for call back for any questions.

## 2022-04-25 NOTE — ED Notes (Signed)
Pt ambulated with symmetrical, steady gait. No episodes of lightheadedness, CP, or ShOB.

## 2022-04-26 LAB — OSMOLALITY, URINE: Osmolality, Ur: 643 mOsm/kg (ref 300–900)

## 2022-04-30 ENCOUNTER — Encounter (HOSPITAL_COMMUNITY): Payer: Medicare HMO

## 2022-05-08 ENCOUNTER — Telehealth (HOSPITAL_COMMUNITY): Payer: Self-pay | Admitting: *Deleted

## 2022-05-08 NOTE — Telephone Encounter (Signed)
Spoke with pt to give instructions for MPI on 05/10/22.

## 2022-05-10 ENCOUNTER — Ambulatory Visit (HOSPITAL_COMMUNITY): Payer: Medicare HMO | Attending: Cardiology

## 2022-05-10 DIAGNOSIS — R079 Chest pain, unspecified: Secondary | ICD-10-CM | POA: Diagnosis not present

## 2022-05-10 LAB — MYOCARDIAL PERFUSION IMAGING
LV dias vol: 67 mL (ref 62–150)
LV sys vol: 28 mL
Nuc Stress EF: 59 %
Peak HR: 81 {beats}/min
Rest HR: 59 {beats}/min
Rest Nuclear Isotope Dose: 10.3 mCi
SDS: 1
SRS: 0
SSS: 1
ST Depression (mm): 0 mm
Stress Nuclear Isotope Dose: 31.5 mCi
TID: 0.96

## 2022-05-10 MED ORDER — TECHNETIUM TC 99M TETROFOSMIN IV KIT
31.5000 | PACK | Freq: Once | INTRAVENOUS | Status: AC | PRN
  Administered 2022-05-10: 31.5 via INTRAVENOUS

## 2022-05-10 MED ORDER — REGADENOSON 0.4 MG/5ML IV SOLN
0.4000 mg | Freq: Once | INTRAVENOUS | Status: AC
  Administered 2022-05-10: 0.4 mg via INTRAVENOUS

## 2022-05-10 MED ORDER — TECHNETIUM TC 99M TETROFOSMIN IV KIT
10.3000 | PACK | Freq: Once | INTRAVENOUS | Status: AC | PRN
  Administered 2022-05-10: 10.3 via INTRAVENOUS

## 2022-05-11 ENCOUNTER — Telehealth: Payer: Self-pay | Admitting: Cardiology

## 2022-05-11 NOTE — Telephone Encounter (Signed)
Returned call to patient left message on personal voice Dr.Crenshaw's RN is out of office this afternoon I will send message to her.

## 2022-05-11 NOTE — Telephone Encounter (Signed)
Patient's wife called wanting to schedule appt with Dr. Jens Som in the [place] office.  I advised her the first available would be in May, she was not happy with that.  She states her husband was in the emergency room and Dr. Jens Som said he would see him in the [place] office.  Patient has a follow up appt at the [address] office on 12/8.

## 2022-05-14 DIAGNOSIS — H524 Presbyopia: Secondary | ICD-10-CM | POA: Diagnosis not present

## 2022-05-16 ENCOUNTER — Ambulatory Visit: Payer: Medicare HMO | Admitting: Physician Assistant

## 2022-05-16 NOTE — Telephone Encounter (Signed)
Follow up scheduled with app. 

## 2022-05-17 NOTE — Progress Notes (Signed)
Office Visit    Patient Name: Jordan Hammond Nickey Date of Encounter: 05/17/2022  Primary Care Provider:  Tempie HoistPapotto, Joseph J III, DO Primary Cardiologist:  None Primary Electrophysiologist: None  Chief Complaint    Jordan Hammond [place]ame is a 81 y.o. male with PMH of CAD s/Hammond CABG with pericardial tissue AVR in 2018, aortic stenosis, HLD, GERD s/Hammond Nissen fundoplication, hiatal hernia who presents today for hospital follow-up.  Past Medical History    Past Medical History:  Diagnosis Date   Aortic atherosclerosis (HCC) 01/20/2017   Aortic stenosis 01/20/2017   CAD (coronary artery disease), native coronary artery 01/20/2017   GERD (gastroesophageal reflux disease)    Hiatal hernia    Hyperlipidemia    Kidney stones    Restless leg syndrome    Past Surgical History:  Procedure Laterality Date   AORTIC VALVE REPLACEMENT N/A 01/29/2017   Procedure: AORTIC VALVE REPLACEMENT (AVR);  Surgeon: Donata ClayVan Trigt, Theron AristaPeter, MD;  [facility];  Service: Open Heart Surgery;  Laterality: N/A;   APPENDECTOMY     bowel obstruction surgery     CHOLECYSTECTOMY     CORONARY ARTERY BYPASS GRAFT N/A 01/29/2017   Procedure: CORONARY ARTERY BYPASS GRAFTING (CABG)  x two, using left internal mammary artery and right leg greater saphenous vein harvested endoscopically;  Surgeon: Kerin PernaVan Trigt, Peter, MD;  [facility];  Service: Open Heart Surgery;  Laterality: N/A;   KNEE ARTHROSCOPY     lap nissan     LEFT HEART CATH AND CORONARY ANGIOGRAPHY N/A 01/21/2017   Procedure: LEFT HEART CATH AND CORONARY ANGIOGRAPHY;  Surgeon: Runell GessBerry, Jonathan J, MD;  Location: [facility] INVASIVE CV LAB;  Service: Cardiovascular;  Laterality: N/A;   RIGHT HEART CATH N/A 01/21/2017   Procedure: RIGHT HEART CATH;  Surgeon: Runell GessBerry, Jonathan J, MD;  [facility];  Service: Cardiovascular;  Laterality: N/A;   SHOULDER SURGERY     TEE WITHOUT CARDIOVERSION N/A 01/29/2017   Procedure: TRANSESOPHAGEAL ECHOCARDIOGRAM (TEE);  Surgeon: Donata ClayVan Trigt,  Theron AristaPeter, MD;  [facility];  Service: Open Heart Surgery;  Laterality: N/A;    Allergies  Allergies  Allergen Reactions   Pravastatin Nausea And Vomiting   Aspirin Nausea And Vomiting    Very heavy doses   Erythromycin Nausea And Vomiting    Unsure if there are other reactions   Fentanyl    Ibuprofen Nausea And Vomiting    Very heavy doses   Nabumetone Nausea And Vomiting   Nsaids Nausea And Vomiting    Very heavy doses   Sulfa Antibiotics    Tetracycline Nausea And Vomiting    Unsure if there are other reactions   Elemental Sulfur Itching, Rash and Swelling   Sulfamethoxazole Itching, Rash and Swelling    History of Present Illness    Jordan Hammond Asencion Hammond  is a 81 year old male with the above mention past medical history who presents today for hospital follow-up.  Jordan Hammond was initially seen in consultation for chest discomfort in 2018.  He has a history also of mild to moderate aortic stenosis and underwent 2D echo that revealed EF of 60-65% with mild LVH and significant aortic stenosis.   A TTE from 2016 showed mild LVH, mild AS with gradient of 18mmHG, mild aortic regurg. LHC was performed and revealed ulcerated and moderately severe LAD stenosis with scattered irregularities in the RCA.  He was consulted by CVTS and underwent AVR with CABG by Dr. Maren BeachVantrigt on on 01/2017.  He  did well postoperatively and was seen in follow-up 09/2018 and was doing well with no complaints noted.  He presented to the ED 04/24/2022 with complaint of chest pain. Patient reports that he climbed a ladder to do some work in his house.  He then sat down and had breakfast and afterwards started experiencing sudden onset substernal chest tightness and his heart was racing.  He was also having episodes of intermittent sharp left-sided chest pain every few seconds.  He took 2 nitroglycerins and aspirin.  Symptoms persisted despite nitroglycerin  and he was encouraged to go to the ED for follow up. He denied any dyspnea or  diaphoresis with this discomfort.  EKG was completed and showed no ST changes and initial troponins were also normal.  He was discharged with plan to have outpatient nuclear stress test and repeat 2D echo.  The Lexiscan Myoview was completed on 05/10/2022 and revealed a normal study that was low risk with no ST deviation and normal left ventricular perfusion with no evidence of infarction.  Mr.  Hammond presents today for posthospital follow-up with his wife.  Since last being seen in the office patient reports that he has been doing well with no chest pain complaints but has noted 1 episode of dizziness that occurred with bending down.  He denies any palpitations or shortness of breath associated with this dizziness.  He  also reports that he feels weak at times and has some discomfort that expands across his chest.  His blood pressure today was well-controlled at 122/62 and heart rate was 49 bpm.  He denies any medication side effects and has been compliant with his current regimen.  He is scheduled today to have a 2D echo performed for evaluation of his aortic valve and other heart structures.  During his visit we reviewed the results of his Lexiscan stress test and all questions were answered to the patient's satisfaction.  Patient denies chest pain, palpitations, dyspnea, PND, orthopnea, nausea, vomiting, dizziness, syncope, edema, weight gain, or early satiety.  Home Medications    Current Outpatient Medications  Medication Sig Dispense Refill   ACETAMINOPHEN PO Take 1,300 mg by mouth daily.     aspirin EC 81 MG EC tablet Take 1 tablet (81 mg total) by mouth daily.     atorvastatin (LIPITOR) 20 MG tablet Take 1 tablet (20 mg total) by mouth daily. 90 tablet 0   calcium carbonate (CALCIUM 600) 600 MG TABS tablet Take 600 mg by mouth daily.     Cholecalciferol (VITAMIN D3) 5000 units CAPS Take 5,000 Units by mouth daily.     clonazePAM (KLONOPIN) 1 MG tablet Take 1 mg by mouth at bedtime.     Coenzyme  Q10-Vitamin E (QUNOL ULTRA COQ10 PO) Take 1 tablet by mouth daily.     diphenhydrAMINE (BENADRYL) 50 MG tablet Take 25 mg by mouth at bedtime.     docusate sodium (COLACE) 100 MG capsule Take 200 mg by mouth daily.     famotidine (PEPCID) 40 MG tablet Take 40 mg by mouth daily.     Flaxseed Oil OIL Take 1,000 mg by mouth daily.     gabapentin (NEURONTIN) 300 MG capsule Take 300 mg by mouth daily.     levothyroxine (SYNTHROID) 50 MCG tablet Take 50 mcg by mouth daily before breakfast.     MAGNESIUM PO Take 1 tablet by mouth daily.     nitroGLYCERIN (NITROSTAT) 0.4 MG SL tablet Place 0.4 mg under the tongue every 5 (five)  minutes as needed for chest pain.     Simethicone (GAS FREE EXTRA STRENGTH PO) Take 1 tablet by mouth as needed (gas).     vitamin B-12 (CYANOCOBALAMIN) 1000 MCG tablet Take 5,000 mcg by mouth daily.     No current facility-administered medications for this visit.     Review of Systems  Please see the history of present illness.    (+) Fatigue (+) Dizziness  All other systems reviewed and are otherwise negative except as noted above.  Physical Exam    Wt Readings from Last 3 Encounters:  04/24/22 195 lb (88.5 kg)  10/08/18 188 lb (85.3 kg)  05/07/18 187 lb 12.8 oz (85.2 kg)   YQ:MVHQI were no vitals filed for this visit.,There is no height or weight on file to calculate BMI.  Constitutional:      Appearance: Healthy appearance. Not in distress.  Neck:     Vascular: JVD normal.  Pulmonary:     Effort: Pulmonary effort is normal.     Breath sounds: No wheezing. No rales. Diminished in the bases Cardiovascular:     Normal rate. Regular rhythm. Normal S1. Normal S2.      Murmurs: 2/6 systolic murmur left sternal border  Edema:    Peripheral edema absent.  Abdominal:     Palpations: Abdomen is soft non tender. There is no hepatomegaly.  Skin:    General: Skin is warm and dry.  Neurological:     General: No focal deficit present.     Mental Status: Alert and  oriented to person, place and time.     Cranial Nerves: Cranial nerves are intact.  EKG/LABS/Other Studies Reviewed    ECG personally reviewed by me today -sinus bradycardia with rate of 49 bpm and no acute changes consistent with previous EKG  Lab Results  Component Value Date   WBC 4.5 04/25/2022   HGB 12.9 (L) 04/25/2022   HCT 38.3 (L) 04/25/2022   MCV 93.2 04/25/2022   PLT 148 (L) 04/25/2022   Lab Results  Component Value Date   CREATININE 1.31 (H) 04/25/2022   BUN 27 (H) 04/25/2022   NA 139 04/25/2022   K 4.3 04/25/2022   CL 107 04/25/2022   CO2 26 04/25/2022   Lab Results  Component Value Date   ALT 19 04/24/2022   AST 24 04/24/2022   ALKPHOS 44 04/24/2022   BILITOT 0.6 04/24/2022   Lab Results  Component Value Date   CHOL 169 01/20/2017   HDL 48 01/20/2017   LDLCALC 103 (H) 01/20/2017   TRIG 92 01/20/2017   CHOLHDL 3.5 01/20/2017    Lab Results  Component Value Date   HGBA1C 5.3 01/24/2017    Assessment & Plan    1.  Coronary artery disease: -s/Hammond CABG in 2018 with bypass grafting x 2 (left internal mammary artery to left anterior descending, saphenous vein graft to obtuse marginal, -Patient presented to the ED 04/2022 with complaint of chest pain and underwent recent Myoview stress test that showed no evidence of ischemia and was normal. -Today patient endorses no chest discomfort but does report fatigue and dizziness at times. -I advised him to increase his fluid intake and also try compression stockings -Continue GDMT with ASA 81 mg, atorvastatin 20 mg  2.  History of aortic stenosis: -s/Hammond AVR replacement with pericardial tissue valve. -Patient's 2D echo was last completed in 2018 and is scheduled to be completed today 05/18/2022 -Patient is euvolemic on exam and denies any chest discomfort  3.  Hyperlipidemia: -Patient's last LDL was 76 on 10/2020 -Continue atorvastatin 20 mg  4.  Chest pain: -Patient presented to the ED on 04/25/2022 with  complaint of left-sided sharp chest pain. -Today patient reports no recurrent chest pain  5.  Bradycardia: Patient noted to have bradycardia with heart rate of 49 bpm and reports increased fatigue. -We will have patient wear 14-day ZIO monitor to evaluate for heart block or pauses.  Disposition: Follow-up with None or APP    Medication Adjustments/Labs and Tests Ordered: Current medicines are reviewed at length with the patient today.  Concerns regarding medicines are outlined above.   Signed, Napoleon Form, Leodis Rains, NP 05/17/2022, 6:56 PM [facility]  Note:  This document was prepared using Dragon voice recognition software and may include unintentional dictation errors.

## 2022-05-18 ENCOUNTER — Encounter: Payer: Self-pay | Admitting: Nurse Practitioner

## 2022-05-18 ENCOUNTER — Ambulatory Visit: Payer: Medicare HMO | Admitting: Nurse Practitioner

## 2022-05-18 ENCOUNTER — Ambulatory Visit: Payer: Medicare HMO | Attending: Cardiology

## 2022-05-18 ENCOUNTER — Ambulatory Visit (INDEPENDENT_AMBULATORY_CARE_PROVIDER_SITE_OTHER): Payer: Medicare HMO

## 2022-05-18 VITALS — BP 122/62 | HR 49 | Ht 72.0 in | Wt 194.6 lb

## 2022-05-18 DIAGNOSIS — I350 Nonrheumatic aortic (valve) stenosis: Secondary | ICD-10-CM

## 2022-05-18 DIAGNOSIS — R001 Bradycardia, unspecified: Secondary | ICD-10-CM

## 2022-05-18 DIAGNOSIS — I25118 Atherosclerotic heart disease of native coronary artery with other forms of angina pectoris: Secondary | ICD-10-CM

## 2022-05-18 DIAGNOSIS — R079 Chest pain, unspecified: Secondary | ICD-10-CM

## 2022-05-18 DIAGNOSIS — E785 Hyperlipidemia, unspecified: Secondary | ICD-10-CM

## 2022-05-18 DIAGNOSIS — Z9889 Other specified postprocedural states: Secondary | ICD-10-CM | POA: Diagnosis not present

## 2022-05-18 LAB — ECHOCARDIOGRAM COMPLETE
AR max vel: 1.3 cm2
AV Area VTI: 1.46 cm2
AV Area mean vel: 1.42 cm2
AV Mean grad: 10 mmHg
AV Peak grad: 20.4 mmHg
Ao pk vel: 2.26 m/s
Area-P 1/2: 2.33 cm2
Height: 72 in
S' Lateral: 2.8 cm
Weight: 3113.6 oz

## 2022-05-18 NOTE — Patient Instructions (Signed)
Medication Instructions:  Your physician recommends that you continue on your current medications as directed. Please refer to the Current Medication list given to you today. *If you need a refill on your cardiac medications before your next appointment, please call your pharmacy*   Lab Work: None Ordered   Testing/Procedures: None ordered   Follow-Up: At [facility], you and your health needs are our priority.  As part of our continuing mission to provide you with exceptional heart care, we have created designated Provider Care Teams.  These Care Teams include your primary Cardiologist (physician) and Advanced Practice Providers (APPs -  Physician Assistants and Nurse Practitioners) who all work together to provide you with the care you need, when you need it.  We recommend signing up for the patient portal called "MyChart".  Sign up information is provided on this After Visit Summary.  MyChart is used to connect with patients for Virtual Visits (Telemedicine).  Patients are able to view lab/test results, encounter notes, upcoming appointments, etc.  Non-urgent messages can be sent to your provider as well.   To learn more about what you can do with MyChart, go to [email or web address].    Your next appointment:   Pending provider switch   The format for your next appointment:   In Person  Provider:   Wants to change to [facility]  Other Instructions Wear compression hose to help with dizziness Increase Fluid intake  Check your heart rate   Important Information About Sugar

## 2022-05-18 NOTE — Addendum Note (Signed)
Addended by: Kandice Robinsons T on: 05/18/2022 01:45 PM   Modules accepted: Orders

## 2022-05-18 NOTE — Progress Notes (Unsigned)
Enrolled patient for a 14 day Zio XT monitor to be mailed to patients home   Dr Crenshaw to read 

## 2022-05-29 DIAGNOSIS — E785 Hyperlipidemia, unspecified: Secondary | ICD-10-CM | POA: Diagnosis not present

## 2022-05-29 DIAGNOSIS — R079 Chest pain, unspecified: Secondary | ICD-10-CM | POA: Diagnosis not present

## 2022-05-29 DIAGNOSIS — I350 Nonrheumatic aortic (valve) stenosis: Secondary | ICD-10-CM | POA: Diagnosis not present

## 2022-05-29 DIAGNOSIS — R001 Bradycardia, unspecified: Secondary | ICD-10-CM

## 2022-05-29 DIAGNOSIS — I25118 Atherosclerotic heart disease of native coronary artery with other forms of angina pectoris: Secondary | ICD-10-CM

## 2022-06-06 DIAGNOSIS — R079 Chest pain, unspecified: Secondary | ICD-10-CM | POA: Diagnosis not present

## 2022-06-06 DIAGNOSIS — I25118 Atherosclerotic heart disease of native coronary artery with other forms of angina pectoris: Secondary | ICD-10-CM | POA: Diagnosis not present

## 2022-06-19 DIAGNOSIS — E785 Hyperlipidemia, unspecified: Secondary | ICD-10-CM | POA: Diagnosis not present

## 2022-06-19 DIAGNOSIS — E538 Deficiency of other specified B group vitamins: Secondary | ICD-10-CM | POA: Diagnosis not present

## 2022-06-19 DIAGNOSIS — R0789 Other chest pain: Secondary | ICD-10-CM | POA: Diagnosis not present

## 2022-06-19 DIAGNOSIS — I2510 Atherosclerotic heart disease of native coronary artery without angina pectoris: Secondary | ICD-10-CM | POA: Diagnosis not present

## 2022-06-19 DIAGNOSIS — R350 Frequency of micturition: Secondary | ICD-10-CM | POA: Diagnosis not present

## 2022-06-19 DIAGNOSIS — E039 Hypothyroidism, unspecified: Secondary | ICD-10-CM | POA: Diagnosis not present

## 2022-06-19 DIAGNOSIS — M109 Gout, unspecified: Secondary | ICD-10-CM | POA: Diagnosis not present

## 2022-06-25 DIAGNOSIS — H52209 Unspecified astigmatism, unspecified eye: Secondary | ICD-10-CM | POA: Diagnosis not present

## 2022-06-25 DIAGNOSIS — H524 Presbyopia: Secondary | ICD-10-CM | POA: Diagnosis not present

## 2022-06-25 DIAGNOSIS — H5203 Hypermetropia, bilateral: Secondary | ICD-10-CM | POA: Diagnosis not present

## 2022-07-03 DIAGNOSIS — E039 Hypothyroidism, unspecified: Secondary | ICD-10-CM | POA: Diagnosis not present

## 2022-07-03 DIAGNOSIS — N189 Chronic kidney disease, unspecified: Secondary | ICD-10-CM | POA: Diagnosis not present

## 2022-07-03 DIAGNOSIS — E782 Mixed hyperlipidemia: Secondary | ICD-10-CM | POA: Diagnosis not present

## 2022-07-03 DIAGNOSIS — M159 Polyosteoarthritis, unspecified: Secondary | ICD-10-CM | POA: Diagnosis not present

## 2022-07-03 DIAGNOSIS — B351 Tinea unguium: Secondary | ICD-10-CM | POA: Diagnosis not present

## 2022-07-03 DIAGNOSIS — G2581 Restless legs syndrome: Secondary | ICD-10-CM | POA: Diagnosis not present

## 2022-07-03 DIAGNOSIS — I2510 Atherosclerotic heart disease of native coronary artery without angina pectoris: Secondary | ICD-10-CM | POA: Diagnosis not present

## 2022-07-03 DIAGNOSIS — M1711 Unilateral primary osteoarthritis, right knee: Secondary | ICD-10-CM | POA: Diagnosis not present

## 2022-07-03 DIAGNOSIS — Z0000 Encounter for general adult medical examination without abnormal findings: Secondary | ICD-10-CM | POA: Diagnosis not present

## 2022-07-03 DIAGNOSIS — M25561 Pain in right knee: Secondary | ICD-10-CM | POA: Diagnosis not present

## 2022-07-30 DIAGNOSIS — M722 Plantar fascial fibromatosis: Secondary | ICD-10-CM | POA: Diagnosis not present

## 2022-08-10 DIAGNOSIS — M7732 Calcaneal spur, left foot: Secondary | ICD-10-CM | POA: Diagnosis not present

## 2022-08-10 DIAGNOSIS — M7662 Achilles tendinitis, left leg: Secondary | ICD-10-CM | POA: Diagnosis not present

## 2022-08-10 DIAGNOSIS — M79672 Pain in left foot: Secondary | ICD-10-CM | POA: Diagnosis not present

## 2022-08-10 DIAGNOSIS — L909 Atrophic disorder of skin, unspecified: Secondary | ICD-10-CM | POA: Diagnosis not present

## 2022-09-03 DIAGNOSIS — M6702 Short Achilles tendon (acquired), left ankle: Secondary | ICD-10-CM | POA: Diagnosis not present

## 2022-09-03 DIAGNOSIS — M722 Plantar fascial fibromatosis: Secondary | ICD-10-CM | POA: Diagnosis not present

## 2022-09-24 DIAGNOSIS — L853 Xerosis cutis: Secondary | ICD-10-CM | POA: Diagnosis not present

## 2022-09-24 DIAGNOSIS — B029 Zoster without complications: Secondary | ICD-10-CM | POA: Diagnosis not present

## 2022-09-27 DIAGNOSIS — T161XXA Foreign body in right ear, initial encounter: Secondary | ICD-10-CM | POA: Diagnosis not present

## 2022-10-08 DIAGNOSIS — B351 Tinea unguium: Secondary | ICD-10-CM | POA: Diagnosis not present

## 2022-10-08 DIAGNOSIS — Z91018 Allergy to other foods: Secondary | ICD-10-CM | POA: Diagnosis not present

## 2022-10-08 DIAGNOSIS — L719 Rosacea, unspecified: Secondary | ICD-10-CM | POA: Diagnosis not present

## 2022-12-30 DIAGNOSIS — T161XXA Foreign body in right ear, initial encounter: Secondary | ICD-10-CM | POA: Diagnosis not present

## 2022-12-30 DIAGNOSIS — M722 Plantar fascial fibromatosis: Secondary | ICD-10-CM | POA: Diagnosis not present

## 2023-01-11 DIAGNOSIS — M722 Plantar fascial fibromatosis: Secondary | ICD-10-CM | POA: Diagnosis not present

## 2023-01-11 DIAGNOSIS — M6702 Short Achilles tendon (acquired), left ankle: Secondary | ICD-10-CM | POA: Diagnosis not present

## 2023-02-18 DIAGNOSIS — R5383 Other fatigue: Secondary | ICD-10-CM | POA: Diagnosis not present

## 2023-02-18 DIAGNOSIS — N529 Male erectile dysfunction, unspecified: Secondary | ICD-10-CM | POA: Diagnosis not present

## 2023-02-18 DIAGNOSIS — E039 Hypothyroidism, unspecified: Secondary | ICD-10-CM | POA: Diagnosis not present

## 2023-02-22 DIAGNOSIS — B351 Tinea unguium: Secondary | ICD-10-CM | POA: Diagnosis not present

## 2023-02-22 DIAGNOSIS — M722 Plantar fascial fibromatosis: Secondary | ICD-10-CM | POA: Diagnosis not present

## 2023-02-22 NOTE — Progress Notes (Signed)
HPI: Follow-up coronary artery disease, aortic stenosis status post AVR and hyperlipidemia.  Patient is status post coronary artery bypass and graft with a LIMA to the LAD, saphenous vein graft to the obtuse marginal and aortic valve replacement with pericardial tissue valve in 2018.  Nuclear study November 2023 showed no ischemia or infarction and normal LV function.  Echocardiogram December 2023 showed normal LV function, previous aortic valve replacement with mean gradient 10 mmHg and no aortic insufficiency, mildly dilated aortic root at 40 mm.  Monitor December 2023 showed sinus rhythm with occasional PAC, brief PAT and rare PVC.  Since last seen he has some fatigue but denies dyspnea, chest pain, palpitations or syncope.  Current Outpatient Medications  Medication Sig Dispense Refill   ACETAMINOPHEN PO Take 1,300 mg by mouth daily.     aspirin EC 81 MG EC tablet Take 1 tablet (81 mg total) by mouth daily.     atorvastatin (LIPITOR) 20 MG tablet Take 1 tablet (20 mg total) by mouth daily. 90 tablet 0   calcium carbonate (CALCIUM 600) 600 MG TABS tablet Take 600 mg by mouth daily.     Cholecalciferol (VITAMIN D3) 5000 units CAPS Take 5,000 Units by mouth daily.     clonazePAM (KLONOPIN) 1 MG tablet Take 1 mg by mouth at bedtime.     Coenzyme Q10-Vitamin E (QUNOL ULTRA COQ10 PO) Take 1 tablet by mouth daily.     diphenhydrAMINE (BENADRYL) 50 MG tablet Take 25 mg by mouth at bedtime.     docusate sodium (COLACE) 100 MG capsule Take 200 mg by mouth daily.     famotidine (PEPCID) 40 MG tablet Take 40 mg by mouth daily.     Flaxseed Oil OIL Take 1,000 mg by mouth daily.     gabapentin (NEURONTIN) 300 MG capsule Take 300 mg by mouth daily.     levothyroxine (SYNTHROID) 50 MCG tablet Take 50 mcg by mouth daily before breakfast.     MAGNESIUM PO Take 1 tablet by mouth daily.     nitroGLYCERIN (NITROSTAT) 0.4 MG SL tablet Place 0.4 mg under the tongue every 5 (five) minutes as needed for chest  pain.     sildenafil (VIAGRA) 100 MG tablet Take 100 mg by mouth as needed for erectile dysfunction.     Simethicone (GAS FREE EXTRA STRENGTH PO) Take 1 tablet by mouth as needed (gas).     vitamin B-12 (CYANOCOBALAMIN) 1000 MCG tablet Take 5,000 mcg by mouth daily.     No current facility-administered medications for this visit.     Past Medical History:  Diagnosis Date   Aortic atherosclerosis (HCC) 01/20/2017   Aortic stenosis 01/20/2017   CAD (coronary artery disease), native coronary artery 01/20/2017   GERD (gastroesophageal reflux disease)    Hiatal hernia    Hyperlipidemia    Kidney stones    Restless leg syndrome     Past Surgical History:  Procedure Laterality Date   AORTIC VALVE REPLACEMENT N/A 01/29/2017   Procedure: AORTIC VALVE REPLACEMENT (AVR);  Surgeon: Donata Clay, Theron Arista, MD;  Location: [facility] OR;  Service: Open Heart Surgery;  Laterality: N/A;   APPENDECTOMY     bowel obstruction surgery     CHOLECYSTECTOMY     CORONARY ARTERY BYPASS GRAFT N/A 01/29/2017   Procedure: CORONARY ARTERY BYPASS GRAFTING (CABG)  x two, using left internal mammary artery and right leg greater saphenous vein harvested endoscopically;  Surgeon: Kerin Perna, MD;  Location: [facility] OR;  Service:  Open Heart Surgery;  Laterality: N/A;   KNEE ARTHROSCOPY     lap nissan     LEFT HEART CATH AND CORONARY ANGIOGRAPHY N/A 01/21/2017   Procedure: LEFT HEART CATH AND CORONARY ANGIOGRAPHY;  Surgeon: Runell Gess, MD;  Location: [facility] INVASIVE CV LAB;  Service: Cardiovascular;  Laterality: N/A;   RIGHT HEART CATH N/A 01/21/2017   Procedure: RIGHT HEART CATH;  Surgeon: Runell Gess, MD;  Location: [facility] INVASIVE CV LAB;  Service: Cardiovascular;  Laterality: N/A;   SHOULDER SURGERY     TEE WITHOUT CARDIOVERSION N/A 01/29/2017   Procedure: TRANSESOPHAGEAL ECHOCARDIOGRAM (TEE);  Surgeon: Donata Clay, Theron Arista, MD;  Location: [facility] OR;  Service: Open Heart Surgery;  Laterality: N/A;    Social History   Socioeconomic  History   Marital status: Married    Spouse name: Not on file   Number of children: Not on file   Years of education: Not on file   Highest education level: Not on file  Occupational History   Not on file  Tobacco Use   Smoking status: Never   Smokeless tobacco: Never  Vaping Use   Vaping status: Never Used  Substance and Sexual Activity   Alcohol use: No   Drug use: No   Sexual activity: Yes  Other Topics Concern   Not on file  Social History Narrative   Divorced, remarried.  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 Strain: Not on [employer] Insecurity: Not on file  Transportation Needs: Not on file  Physical Activity: Not on file  Stress: No Stress Concern Present (09/28/2021)   Received from [facility], [facility]   [employer] of Occupational Health - Occupational Stress Questionnaire    Feeling of Stress : Not at all  Social Connections: Unknown (10/09/2021)   Received from [facility], [facility]   Social Network    Social Network: Not on file  Intimate Partner Violence: Unknown (09/12/2021)   Received from [employer], [facility]   HITS    Physically Hurt: Not on file    Insult or Talk Down To: Not on file    Threaten Physical Harm: Not on file    Scream or Curse: Not on file    Family History  Problem Relation Age of Onset   CAD Father        s/p triple bypass   Dementia Mother     ROS: no fevers or chills, productive cough, hemoptysis, dysphasia, odynophagia, melena, hematochezia, dysuria, hematuria, rash, seizure activity, orthopnea, PND, pedal edema, claudication. Remaining systems are negative.  Physical Exam: Well-developed well-nourished in no acute distress.  Skin is warm and dry.  HEENT is normal.  Neck is supple.  Chest is clear to auscultation with normal expansion.  Cardiovascular exam is regular rate and rhythm.  Abdominal exam nontender  or distended. No masses palpated. Extremities show no edema. neuro grossly intact   A/P  1 coronary artery disease status post coronary bypass graft-patient denies chest pain.  Continue aspirin and statin.  2 history of aortic valve replacement-most recent echocardiogram showed normally functioning valve.  Continue SBE prophylaxis.  3 hyperlipidemia-continue statin.  Olga Millers, MD

## 2023-03-04 ENCOUNTER — Encounter: Payer: Self-pay | Admitting: Cardiology

## 2023-03-04 ENCOUNTER — Ambulatory Visit: Payer: Medicare HMO | Attending: Cardiology | Admitting: Cardiology

## 2023-03-04 VITALS — BP 112/78 | HR 74 | Ht 72.0 in | Wt 193.4 lb

## 2023-03-04 DIAGNOSIS — E785 Hyperlipidemia, unspecified: Secondary | ICD-10-CM | POA: Diagnosis not present

## 2023-03-04 DIAGNOSIS — Z9889 Other specified postprocedural states: Secondary | ICD-10-CM

## 2023-03-04 DIAGNOSIS — I25118 Atherosclerotic heart disease of native coronary artery with other forms of angina pectoris: Secondary | ICD-10-CM

## 2023-03-04 DIAGNOSIS — R001 Bradycardia, unspecified: Secondary | ICD-10-CM

## 2023-03-04 NOTE — Patient Instructions (Addendum)
Follow-Up: At [facility], you and your health needs are our priority.  As part of our continuing mission to provide you with exceptional heart care, we have created designated Provider Care Teams.  These Care Teams include your primary Cardiologist (physician) and Advanced Practice Providers (APPs -  Physician Assistants and Nurse Practitioners) who all work together to provide you with the care you need, when you need it.  We recommend signing up for the patient portal called "MyChart".  Sign up information is provided on this After Visit Summary.  MyChart is used to connect with patients for Virtual Visits (Telemedicine).  Patients are able to view lab/test results, encounter notes, upcoming appointments, etc.  Non-urgent messages can be sent to your provider as well.   To learn more about what you can do with MyChart, go to [email or web address].    Your next appointment:   6 month(s)  Provider:   Olga Millers, MD

## 2023-03-11 DIAGNOSIS — M545 Low back pain, unspecified: Secondary | ICD-10-CM | POA: Diagnosis not present

## 2023-05-29 DIAGNOSIS — R051 Acute cough: Secondary | ICD-10-CM | POA: Diagnosis not present

## 2023-05-29 DIAGNOSIS — R0981 Nasal congestion: Secondary | ICD-10-CM | POA: Diagnosis not present

## 2023-05-29 DIAGNOSIS — R509 Fever, unspecified: Secondary | ICD-10-CM | POA: Diagnosis not present

## 2023-06-22 DIAGNOSIS — R079 Chest pain, unspecified: Secondary | ICD-10-CM | POA: Diagnosis not present

## 2023-06-22 DIAGNOSIS — R059 Cough, unspecified: Secondary | ICD-10-CM | POA: Diagnosis not present

## 2023-06-22 DIAGNOSIS — R0602 Shortness of breath: Secondary | ICD-10-CM | POA: Diagnosis not present

## 2023-06-22 DIAGNOSIS — I2510 Atherosclerotic heart disease of native coronary artery without angina pectoris: Secondary | ICD-10-CM | POA: Diagnosis not present

## 2023-06-22 DIAGNOSIS — Z79899 Other long term (current) drug therapy: Secondary | ICD-10-CM | POA: Diagnosis not present

## 2023-06-22 DIAGNOSIS — Z9889 Other specified postprocedural states: Secondary | ICD-10-CM | POA: Diagnosis not present

## 2023-06-22 DIAGNOSIS — R051 Acute cough: Secondary | ICD-10-CM | POA: Diagnosis not present

## 2023-06-30 DIAGNOSIS — R0982 Postnasal drip: Secondary | ICD-10-CM | POA: Diagnosis not present

## 2023-06-30 DIAGNOSIS — H9203 Otalgia, bilateral: Secondary | ICD-10-CM | POA: Diagnosis not present

## 2023-07-13 DIAGNOSIS — H6691 Otitis media, unspecified, right ear: Secondary | ICD-10-CM | POA: Diagnosis not present

## 2023-07-13 DIAGNOSIS — H6692 Otitis media, unspecified, left ear: Secondary | ICD-10-CM | POA: Diagnosis not present

## 2023-08-15 NOTE — Progress Notes (Signed)
 HPI: Follow-up coronary artery disease, aortic stenosis status post AVR and hyperlipidemia. Patient is status post coronary artery bypass and graft with a LIMA to the LAD, saphenous vein graft to the obtuse marginal and aortic valve replacement with pericardial tissue valve in 2018. Nuclear study November 2023 showed no ischemia or infarction and normal LV function. Echocardiogram December 2023 showed normal LV function, previous aortic valve replacement with mean gradient 10 mmHg and no aortic insufficiency, mildly dilated aortic root at 40 mm. Monitor December 2023 showed sinus rhythm with occasional PAC, brief PAT and rare PVC. Since last seen the patient has dyspnea with more extreme activities but not with routine activities. It is relieved with rest. It is not associated with chest pain. There is no orthopnea, PND or pedal edema. There is no syncope or palpitations. There is no exertional chest pain.   Current Outpatient Medications  Medication Sig Dispense Refill   ACETAMINOPHEN PO Take 1,300 mg by mouth daily.     aspirin EC 81 MG EC tablet Take 1 tablet (81 mg total) by mouth daily.     atorvastatin (LIPITOR) 20 MG tablet Take 1 tablet (20 mg total) by mouth daily. 90 tablet 0   calcium carbonate (CALCIUM 600) 600 MG TABS tablet Take 600 mg by mouth daily.     Cholecalciferol (VITAMIN D3) 5000 units CAPS Take 5,000 Units by mouth daily.     clonazePAM (KLONOPIN) 1 MG tablet Take 1 mg by mouth at bedtime.     Coenzyme Q10-Vitamin E (QUNOL ULTRA COQ10 PO) Take 1 tablet by mouth daily.     diphenhydrAMINE (BENADRYL) 50 MG tablet Take 25 mg by mouth at bedtime.     docusate sodium (COLACE) 100 MG capsule Take 200 mg by mouth daily.     famotidine (PEPCID) 40 MG tablet Take 40 mg by mouth daily.     Flaxseed Oil OIL Take 1,000 mg by mouth daily.     gabapentin (NEURONTIN) 300 MG capsule Take 300 mg by mouth daily.     levothyroxine (SYNTHROID) 50 MCG tablet Take 50 mcg by mouth daily  before breakfast.     MAGNESIUM PO Take 1 tablet by mouth daily.     nitroGLYCERIN (NITROSTAT) 0.4 MG SL tablet Place 0.4 mg under the tongue every 5 (five) minutes as needed for chest pain.     sildenafil (VIAGRA) 100 MG tablet Take 100 mg by mouth as needed for erectile dysfunction.     Simethicone (GAS FREE EXTRA STRENGTH PO) Take 1 tablet by mouth as needed (gas).     vitamin B-12 (CYANOCOBALAMIN) 1000 MCG tablet Take 5,000 mcg by mouth daily.     No current facility-administered medications for this visit.     Past Medical History:  Diagnosis Date   Aortic atherosclerosis (HCC) 01/20/2017   Aortic stenosis 01/20/2017   CAD (coronary artery disease), native coronary artery 01/20/2017   GERD (gastroesophageal reflux disease)    Hiatal hernia    Hyperlipidemia    Kidney stones    Restless leg syndrome     Past Surgical History:  Procedure Laterality Date   AORTIC VALVE REPLACEMENT N/A 01/29/2017   Procedure: AORTIC VALVE REPLACEMENT (AVR);  Surgeon: Donata Clay, Theron Arista, MD;  Location: [facility] OR;  Service: Open Heart Surgery;  Laterality: N/A;   APPENDECTOMY     bowel obstruction surgery     CHOLECYSTECTOMY     CORONARY ARTERY BYPASS GRAFT N/A 01/29/2017   Procedure: CORONARY ARTERY BYPASS GRAFTING (  CABG)  x two, using left internal mammary artery and right leg greater saphenous vein harvested endoscopically;  Surgeon: Kerin Perna, MD;  Location: [facility] OR;  Service: Open Heart Surgery;  Laterality: N/A;   KNEE ARTHROSCOPY     lap nissan     LEFT HEART CATH AND CORONARY ANGIOGRAPHY N/A 01/21/2017   Procedure: LEFT HEART CATH AND CORONARY ANGIOGRAPHY;  Surgeon: Runell Gess, MD;  Location: [facility] INVASIVE CV LAB;  Service: Cardiovascular;  Laterality: N/A;   RIGHT HEART CATH N/A 01/21/2017   Procedure: RIGHT HEART CATH;  Surgeon: Runell Gess, MD;  Location: [facility] INVASIVE CV LAB;  Service: Cardiovascular;  Laterality: N/A;   SHOULDER SURGERY     TEE WITHOUT CARDIOVERSION N/A 01/29/2017    Procedure: TRANSESOPHAGEAL ECHOCARDIOGRAM (TEE);  Surgeon: Donata Clay, Theron Arista, MD;  Location: [facility] OR;  Service: Open Heart Surgery;  Laterality: N/A;    Social History   Socioeconomic History   Marital status: Married    Spouse name: Not on file   Number of children: Not on file   Years of education: Not on file   Highest education level: Not on file  Occupational History   Not on file  Tobacco Use   Smoking status: Never   Smokeless tobacco: Never  Vaping Use   Vaping status: Never Used  Substance and Sexual Activity   Alcohol use: No   Drug use: No   Sexual activity: Yes  Other Topics Concern   Not on file  Social History Narrative   Divorced, remarried.  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   Social Drivers of Corporate investment banker Strain: Not on [employer] Insecurity: Not on file  Transportation Needs: Not on file  Physical Activity: Not on file  Stress: No Stress Concern Present (09/28/2021)   Received from [employer], [facility]   [employer] of Occupational Health - Occupational Stress Questionnaire    Feeling of Stress : Not at all  Social Connections: Unknown (10/09/2021)   Received from [facility], [facility]   Social Network    Social Network: Not on file  Intimate Partner Violence: Unknown (09/12/2021)   Received from [employer], [facility]   HITS    Physically Hurt: Not on file    Insult or Talk Down To: Not on file    Threaten Physical Harm: Not on file    Scream or Curse: Not on file    Family History  Problem Relation Age of Onset   CAD Father        s/p triple bypass   Dementia Mother     ROS: no fevers or chills, productive cough, hemoptysis, dysphasia, odynophagia, melena, hematochezia, dysuria, hematuria, rash, seizure activity, orthopnea, PND, pedal edema, claudication. Remaining systems are negative.  Physical Exam: Well-developed well-nourished in no acute distress.  Skin is  warm and dry.  HEENT is normal.  Neck is supple.  Chest is clear to auscultation with normal expansion.  Cardiovascular exam is regular rate and rhythm.  Abdominal exam nontender or distended. No masses palpated. Extremities show no edema. neuro grossly intact EKG Interpretation Date/Time:  Wednesday August 28 2023 10:48:02 EDT Ventricular Rate:  74 PR Interval:  162 QRS Duration:  86 QT Interval:  378 QTC Calculation: 419 R Axis:   26  Text Interpretation: Normal sinus rhythm Nonspecific ST abnormality When compared with ECG of 25-Apr-2022 11:37, PREVIOUS ECG IS PRESENT Confirmed by Olga Millers (  [id number]) on 08/28/2023 10:53:02 AM    A/P  1 coronary artery disease status post coronary bypass and graft-patient doing well from a symptomatic standpoint with no chest pain.  Continue aspirin and statin.  2 status post aortic valve replacement-continue SBE prophylaxis.  3 hyperlipidemia-continue statin.  Olga Millers, MD

## 2023-08-28 ENCOUNTER — Encounter: Payer: Self-pay | Admitting: Cardiology

## 2023-08-28 ENCOUNTER — Ambulatory Visit: Payer: Medicare HMO | Attending: Cardiology | Admitting: Cardiology

## 2023-08-28 VITALS — BP 122/79 | HR 74 | Ht 72.0 in | Wt 194.0 lb

## 2023-08-28 DIAGNOSIS — I25118 Atherosclerotic heart disease of native coronary artery with other forms of angina pectoris: Secondary | ICD-10-CM | POA: Diagnosis not present

## 2023-08-28 DIAGNOSIS — R001 Bradycardia, unspecified: Secondary | ICD-10-CM | POA: Diagnosis not present

## 2023-08-28 DIAGNOSIS — Z9889 Other specified postprocedural states: Secondary | ICD-10-CM

## 2023-08-28 NOTE — Patient Instructions (Signed)
   Follow-Up: At [facility], you and your health needs are our priority.  As part of our continuing mission to provide you with exceptional heart care, we have created designated Provider Care Teams.  These Care Teams include your primary Cardiologist (physician) and Advanced Practice Providers (APPs -  Physician Assistants and Nurse Practitioners) who all work together to provide you with the care you need, when you need it.  We recommend signing up for the patient portal called "MyChart".  Sign up information is provided on this After Visit Summary.  MyChart is used to connect with patients for Virtual Visits (Telemedicine).  Patients are able to view lab/test results, encounter notes, upcoming appointments, etc.  Non-urgent messages can be sent to your provider as well.   To learn more about what you can do with MyChart, go to [email or web address].    Your next appointment:   12 month(s)  Provider:   Olga Millers, MD

## 2023-09-24 ENCOUNTER — Telehealth: Payer: Self-pay | Admitting: Cardiology

## 2023-09-24 NOTE — Telephone Encounter (Signed)
 Pt c/o Shortness Of Breath: STAT if SOB developed within the last 24 hours or pt is noticeably SOB on the phone  1. Are you currently SOB (can you hear that pt is SOB on the phone)?   No  2. How long have you been experiencing SOB?   A couple of weeks  3. Are you SOB when sitting or when up moving around?   When up and moving around  4. Are you currently experiencing any other symptoms?   Fatigue   Wife Cornelius Dill) stated patient has been having SOB off and on for a couple of weeks.  Wife noted they have moved and patient has been picking up and moving stuff.

## 2023-09-24 NOTE — Telephone Encounter (Signed)
 Attempted to call patient, no answer left message requesting a call back.

## 2023-09-25 ENCOUNTER — Telehealth: Payer: Self-pay | Admitting: Cardiology

## 2023-09-25 NOTE — Telephone Encounter (Signed)
 Pt calling to get advise on what he could do about being weak and tired from doing mostly everything. Please advise

## 2023-09-25 NOTE — Telephone Encounter (Signed)
 Please see documentation in 4/15 telephone encounter

## 2023-09-25 NOTE — Telephone Encounter (Signed)
 Patient identification verified by 2 forms. Hilton Lucky, RN    Called and spoke to patient  Patient states:   -he is experiencing symptoms of: dizziness, weakness and SOB  -becomes dizzy when bending down to pick up things   -was previously advised to increase fluid intake   -he has SOB with activity/exertion   -SOB does not occur at rest   -SOB on going for a while, has worsened over the past couple of months  -weakness/tiredness occurs with movement/activity  Patient denies:   -LOC/fall  Patient scheduled for OV 4/17 at 8:00am with PA [facility]  Patient has no further questions at this time

## 2023-09-26 ENCOUNTER — Ambulatory Visit: Attending: Physician Assistant | Admitting: Physician Assistant

## 2023-09-26 VITALS — Ht 72.0 in | Wt 195.0 lb

## 2023-09-26 DIAGNOSIS — I25709 Atherosclerosis of coronary artery bypass graft(s), unspecified, with unspecified angina pectoris: Secondary | ICD-10-CM

## 2023-09-26 DIAGNOSIS — R42 Dizziness and giddiness: Secondary | ICD-10-CM

## 2023-09-26 DIAGNOSIS — Z79899 Other long term (current) drug therapy: Secondary | ICD-10-CM

## 2023-09-26 DIAGNOSIS — E785 Hyperlipidemia, unspecified: Secondary | ICD-10-CM | POA: Diagnosis not present

## 2023-09-26 DIAGNOSIS — Z952 Presence of prosthetic heart valve: Secondary | ICD-10-CM

## 2023-09-26 DIAGNOSIS — R079 Chest pain, unspecified: Secondary | ICD-10-CM

## 2023-09-26 DIAGNOSIS — R0602 Shortness of breath: Secondary | ICD-10-CM

## 2023-09-26 NOTE — Patient Instructions (Signed)
 Medication Instructions:  NO CHANGES *If you need a refill on your cardiac medications before your next appointment, please call your pharmacy*  Lab Work: CBC, BMP, AND TSH TODAY If you have labs (blood work) drawn today and your tests are completely normal, you will receive your results only by: MyChart Message (if you have MyChart) OR A paper copy in the mail If you have any lab test that is abnormal or we need to change your treatment, we will call you to review the results.  Testing/Procedures: Your physician has requested that you have an echocardiogram. Echocardiography is a painless test that uses sound waves to create images of your heart. It provides your doctor with information about the size and shape of your heart and how well your heart's chambers and valves are working. This procedure takes approximately one hour. There are no restrictions for this procedure. Please do NOT wear cologne, perfume, aftershave, or lotions (deodorant is allowed). Please arrive 15 minutes prior to your appointment time.  Please note: We ask at that you not bring children with you during ultrasound (echo/ vascular) testing. Due to room size and safety concerns, children are not allowed in the ultrasound rooms during exams. Our front office staff cannot provide observation of children in our lobby area while testing is being conducted. An adult accompanying a patient to their appointment will only be allowed in the ultrasound room at the discretion of the ultrasound technician under special circumstances. We apologize for any inconvenience.      Please report to Radiology at the [facility] Main Entrance 30 minutes early for your test.  [address] [place], [place] [address]  How to Prepare for Your Cardiac PET/CT Stress Test:  Nothing to eat or drink, except water, 3 hours prior to arrival time.  NO caffeine/decaffeinated products, or chocolate 12 hours prior to arrival. (Please note  decaffeinated beverages (teas/coffees) still contain caffeine).  If you have caffeine within 12 hours prior, the test will need to be rescheduled.  Medication instructions: Do not take erectile dysfunction medications for 72 hours prior to test (sildenafil, tadalafil) Do not take nitrates (isosorbide mononitrate, Ranexa) the day before or day of test Do not take tamsulosin the day before or morning of test Hold theophylline containing medications for 12 hours. Hold Dipyridamole 48 hours prior to the test.  You may take your remaining medications with water.  NO perfume, cologne or lotion on chest or abdomen area. FEMALES - Please avoid wearing dresses to this appointment.  Total time is 1 to 2 hours; you may want to bring reading material for the waiting time.  In preparation for your appointment, medication and supplies will be purchased.  Appointment availability is limited, so if you need to cancel or reschedule, please call the Radiology Department Scheduler at [phone] 24 hours in advance to avoid a cancellation fee of $100.00  What to Expect When you Arrive:  Once you arrive and check in for your appointment, you will be taken to a preparation room within the Radiology Department.  A technologist or Nurse will obtain your medical history, verify that you are correctly prepped for the exam, and explain the procedure.  Afterwards, an IV will be started in your arm and electrodes will be placed on your skin for EKG monitoring during the stress portion of the exam. Then you will be escorted to the PET/CT scanner.  There, staff will get you positioned on the scanner and obtain a blood pressure  and EKG.  During the exam, you will continue to be connected to the EKG and blood pressure machines.  A small, safe amount of a radioactive tracer will be injected in your IV to obtain a series of pictures of your heart along with an injection of a stress agent.    After your Exam:  It is  recommended that you eat a meal and drink a caffeinated beverage to counter act any effects of the stress agent.  Drink plenty of fluids for the remainder of the day and urinate frequently for the first couple of hours after the exam.  Your doctor will inform you of your test results within 7-10 business days.  For more information and frequently asked questions, please visit our website: [email or web address]  For questions about your test or how to prepare for your test, please call: Cardiac Imaging Nurse Navigators Office: [phone]   Follow-Up: At [facility], you and your health needs are our priority.  As part of our continuing mission to provide you with exceptional heart care, our providers are all part of one team.  This team includes your primary Cardiologist (physician) and Advanced Practice Providers or APPs (Physician Assistants and Nurse Practitioners) who all work together to provide you with the care you need, when you need it.  Your next appointment:   2 month(s)  Provider:   Ervin Heath, PA  Other Instructions   1st Floor: - Lobby - Registration  - Pharmacy  - Lab - Cafe  2nd Floor: - PV Lab - Diagnostic Testing (echo, CT, nuclear med)  3rd Floor: - Vacant  4th Floor: - TCTS (cardiothoracic surgery) - AFib Clinic - Structural Heart Clinic - Vascular Surgery  - Vascular Ultrasound  5th Floor: - HeartCare Cardiology (general and EP) - Clinical Pharmacy for coumadin, hypertension, lipid, weight-loss medications, and med management appointments    Valet parking services will be available as well.

## 2023-09-26 NOTE — Progress Notes (Signed)
 Cardiology Office Note:  .   Date:  09/26/2023  ID:  Jordan Hammond, DOB 08-27-1940, MRN [id number] PCP: Jordan Last, FNP  [facility] Providers Cardiologist:  Jordan Angel, MD     History of Present Illness: .   Jordan Hammond is Jordan 83 y.o. male with past medical history of CAD s/p CABG with LIMA-LAD, SVG-OM in 2018, aortic stenosis s/p AVR at the time of CABG and hyperlipidemia.  Myoview in November 2023 showed no ischemia and infarction, normal EF.  Echocardiogram in December 2023 showed normal LV function, previous aortic valve replacement with mean gradient 10 mmHg and no aortic insufficiency, mildly dilated aortic root measuring 40 mm.  Heart monitor in December 2023 showed sinus rhythm with occasional PACs, brief PAT and rare PVCs.  Patient was Hammond seen by Dr. Audery Hammond in March 2025 at which time she was having shortness of breath with more extreme activities but not with her routine activities.  EKG at the time showed normal sinus rhythm, no significant ST-T wave changes.  Patient presented today with 2 main complaints.  For the past several months, he has been having increased dizzy spell when he bent over and try to get up.  Symptom is clearly orthostatic.  He is not on any blood pressure medication.  He try to drink more fluid.  Orthostatic vital sign in the office was positive.  The second issue his wife mentioned was he has been having increasing shortness of breath with activity including walking from the parking lot to the front entrance of [employer] and also some soreness under the chest as well.  I recommended CBC, basic metabolic panel and TSH.  We will obtain an echocardiogram given his prior history of AVR and also Jordan PET stress test.  ROS:   Patient has been complaining of dizziness with body position changes, shortness of breath with exertion and chest tightness.  Studies Reviewed: .         Cardiac Studies & Procedures    ______________________________________________________________________________________________ CARDIAC CATHETERIZATION  CARDIAC CATHETERIZATION 01/21/2017  Conclusion Images from the original result were not included.   Ost LM lesion, 70 %stenosed.  Mid LAD lesion, 70 %stenosed.  Ost Cx to Prox Cx lesion, 50 %stenosed.  Ost 1st Mrg to 1st Mrg lesion, 80 %stenosed.  Jordan Hammond is Jordan 83 y.o. male   [id number] LOCATION:  FACILITY: [facility] PHYSICIAN: Jordan Hammond, M.D. 05/21/1941   DATE OF PROCEDURE:  01/21/2017  DATE OF DISCHARGE:     CARDIAC CATHETERIZATION    History obtained from chart review. Jordan Hammond was admitted with unstable angina. He does have Jordan history of hyperlipidemia and aortic stenosis by 2-D echo which point his admission was shown to have progressed to the moderately severe range with Jordan valve area 0.65 cm. His enzymes were negative. He presents now for right left heart cath to define his anatomy and physiology.  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 I believe this is hemodynamic significant. His 2-D echo showed at least moderately severe aortic stenosis however this was not confirmed by hemodynamic findings with Jordan minimal gradient across the aortic valve of 4 mmHg. Is likely need transesophageal echo. He will be coronary artery  bypass grafting plus or minus aortic valve replacement. The sheaths will be removed and pressure held on the groin to achieve hemostasis. The patient left the lab in stable condition.  Jordan Hammond. MD, [facility] 01/21/2017 2:49 PM  Findings Coronary Findings Diagnostic  Dominance: Right  Left  Main The lesion is ulcerative.  Left Anterior Descending  Left Circumflex  First Obtuse Marginal Branch  Intervention  No interventions have been documented.   STRESS TESTS  MYOCARDIAL PERFUSION IMAGING 05/10/2022  Narrative   The study is normal. The study is low risk.   No ST deviation was noted.   LV perfusion is normal. There is no evidence of ischemia. There is no evidence of infarction.   Left ventricular function is normal. End diastolic cavity size is normal. End systolic cavity size is normal.   Prior study not available for comparison.   ECHOCARDIOGRAM  ECHOCARDIOGRAM COMPLETE 05/18/2022  Narrative ECHOCARDIOGRAM REPORT    Patient Name:   Jordan Hammond Jordan Hammond Date of Exam: 05/18/2022 Medical Rec #:  [id number]      Height:       72.0 in Accession #:    [id number]     Weight:       194.6 lb Date of Birth:  10/23/1940      BSA:          2.106 m Patient Age:    81 years       BP:           122/62 mmHg Patient Gender: M              HR:           61 bpm. Exam Location:  [address]  Procedure: 2D Echo, Cardiac Doppler and Color Doppler  Indications:    Z95.2 AVR  History:        Patient has prior history of Echocardiogram examinations, most recent 01/20/2017. S/p AVR (pericardial tissue valve), CAD, Prior CABG, Arrythmias:Bradycardia; Risk Factors:Dyslipidemia. Aortic Valve: bioprosthetic valve is present in the aortic position.  Sonographer:    Jordan Hammond RDCS Referring Phys: [id number] Jordan Hammond  IMPRESSIONS   1. Left ventricular ejection fraction, by estimation, is 60 to 65%. The left ventricle has normal function. The left ventricle has no regional wall motion abnormalities. Left ventricular diastolic parameters were normal. 2. Right ventricular systolic function is normal. The right ventricular size is normal. There is normal pulmonary artery systolic pressure. The estimated right ventricular systolic pressure is 22.9 mmHg. 3. The mitral valve is  normal in structure. No evidence of mitral valve regurgitation. No evidence of mitral stenosis. 4. The aortic valve has been repaired/replaced. Aortic valve regurgitation is not visualized. No aortic stenosis is present. There is Jordan bioprosthetic valve present in the aortic position. Echo findings are consistent with normal structure and function of the aortic valve prosthesis. Aortic valve mean gradient measures 10.0 mmHg. Aortic valve Vmax measures 2.26 m/s. Aortic valve acceleration time measures 82 msec. 5. There is mild dilatation of the aortic root, measuring 40 mm. There is Moderate (Grade III) protruding plaque involving the aortic arch. 6. The inferior vena cava is normal in size with greater than 50% respiratory variability, suggesting right atrial pressure of 3 mmHg.  Comparison(s): Prior images unable to be directly viewed, comparison made by report only.  FINDINGS Left Ventricle: Left ventricular ejection fraction, by estimation, is 60 to 65%. The left ventricle has normal function. The left ventricle has no regional wall motion abnormalities. The left ventricular  internal cavity size was normal in size. There is no left ventricular hypertrophy. Left ventricular diastolic parameters were normal. Normal left ventricular filling pressure.  Right Ventricle: The right ventricular size is normal. No increase in right ventricular wall thickness. Right ventricular systolic function is normal. There is normal pulmonary artery systolic pressure. The tricuspid regurgitant velocity is 2.23 m/s, and with an assumed right atrial pressure of 3 mmHg, the estimated right ventricular systolic pressure is 22.9 mmHg.  Left Atrium: Left atrial size was normal in size.  Right Atrium: Right atrial size was normal in size.  Pericardium: There is no evidence of pericardial effusion.  Mitral Valve: The mitral valve is normal in structure. There is mild thickening of the anterior mitral valve leaflet(s).  There is mild calcification of the anterior mitral valve leaflet(s). No evidence of mitral valve regurgitation. No evidence of mitral valve stenosis.  Tricuspid Valve: The tricuspid valve is normal in structure. Tricuspid valve regurgitation is not demonstrated. No evidence of tricuspid stenosis.  Aortic Valve: The aortic valve has been repaired/replaced. Aortic valve regurgitation is not visualized. No aortic stenosis is present. Aortic valve mean gradient measures 10.0 mmHg. Aortic valve peak gradient measures 20.4 mmHg. Aortic valve area, by VTI measures 1.46 cm. There is Jordan bioprosthetic valve present in the aortic position. Echo findings are consistent with normal structure and function of the aortic valve prosthesis.  Pulmonic Valve: The pulmonic valve was normal in structure. Pulmonic valve regurgitation is not visualized. No evidence of pulmonic stenosis.  Aorta: The aortic root is normal in size and structure. There is mild dilatation of the aortic root, measuring 40 mm. There is moderate (Grade III) protruding plaque involving the aortic arch.  Venous: The inferior vena cava is normal in size with greater than 50% respiratory variability, suggesting right atrial pressure of 3 mmHg.  IAS/Shunts: No atrial level shunt detected by color flow Doppler.   LEFT VENTRICLE PLAX 2D LVIDd:         4.20 cm   Diastology LVIDs:         2.80 cm   LV e' medial:    9.03 cm/s LV PW:         1.10 cm   LV E/e' medial:  12.0 LV IVS:        1.10 cm   LV e' lateral:   11.30 cm/s LVOT diam:     2.10 cm   LV E/e' lateral: 9.6 LV SV:         68 LV SV Index:   32 LVOT Area:     3.46 cm   RIGHT VENTRICLE             IVC RV S prime:     10.40 cm/s  IVC diam: 1.40 cm TAPSE (M-mode): 1.7 cm RVSP:           22.9 mmHg  LEFT ATRIUM             Index        RIGHT ATRIUM           Index LA diam:        3.50 cm 1.66 cm/m   RA Pressure: 3.00 mmHg LA Vol (A2C):   56.8 ml 26.97 ml/m  RA Area:     13.80  cm LA Vol (A4C):   54.9 ml 26.07 ml/m  RA Volume:   37.90 ml  18.00 ml/m LA Biplane Vol: 57.0 ml 27.07 ml/m AORTIC VALVE AV Area (Vmax):  1.30 cm AV Area (Vmean):   1.42 cm AV Area (VTI):     1.46 cm AV Vmax:           225.67 cm/s AV Vmean:          144.000 cm/s AV VTI:            0.464 m AV Peak Grad:      20.4 mmHg AV Mean Grad:      10.0 mmHg LVOT Vmax:         84.60 cm/s LVOT Vmean:        59.100 cm/s LVOT VTI:          0.196 m LVOT/AV VTI ratio: 0.42  AORTA Ao Root diam: 4.00 cm Ao Asc diam:  2.80 cm  MITRAL VALVE                TRICUSPID VALVE MV Area (PHT): 2.33 cm     TR Peak grad:   19.9 mmHg MV Decel Time: 326 msec     TR Vmax:        223.00 cm/s MV E velocity: 108.00 cm/s  Estimated RAP:  3.00 mmHg MV Jordan velocity: 106.00 cm/s  RVSP:           22.9 mmHg MV E/Jordan ratio:  1.02 SHUNTS Systemic VTI:  0.20 m Systemic Diam: 2.10 cm  Luana Rumple MD Electronically signed by Luana Rumple MD Signature Date/Time: 05/18/2022/5:12:29 PM    Final   TEE  ECHO TEE 01/29/2017  Narrative *[place]* *[facility]* [address] [place], [place] [address] [phone]  ------------------------------------------------------------------- Intraoperative Transesophageal Echocardiography  Patient:    Jamaul, Heist MR #:       [id number] Study Date: 01/29/2017 Gender:     M Age:        37 Height:     182.9 cm Weight:     81 kg BSA:        2.03 m^2 Pt. Status: Room:       [facility]  ATTENDING    VanTrigt, Nonie Beady REFERRING    VanTrigt, Peter PERFORMING   Jonne Netters, MD ADMITTING    Rosezetta Contras SONOGRAPHER  Raynelle Callow  cc:  ------------------------------------------------------------------- LV EF: 55% -   60%  ------------------------------------------------------------------- Indications:      Aortic stenosis 424.1.  ------------------------------------------------------------------- History:    PMH:   Coronary artery disease.  Angina pectoris.  ------------------------------------------------------------------- Study Conclusions  - Left ventricle: Systolic function was normal. The estimated ejection fraction was in the range of 55% to 60%. Wall motion was normal; there were no regional wall motion abnormalities. - Aortic valve: Normal-sized, mildly calcified annulus. Trileaflet; mildly thickened leaflets. Cusp separation was moderately reduced. Valve mobility was restricted. Transvalvular velocity was increased less than expected. There was moderate stenosis. There was mild regurgitation originating from the central coaptation point and directed towards the mitral anterior leaflet. Peak velocity (S): 294 cm/s. Mean gradient (S): 22 mm Hg. Peak gradient (S): 35 mm Hg. Valve area (VTI): 0.71 cm^2. Valve area (Vmax): 0.93 cm^2. Valve area (Vmean): 0.83 cm^2. - Mitral valve: Normal-sized annulus. Mildly thickened leaflets . There was trivial regurgitation. 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 - Staged echo: Limited post-CPB exam: Good , vigorous LV function. EF 55-60%. Wall motion appears normal. Prosthetic aortic valve is well-seated in the Aortic annulus.  No AI seen in LV outflow tract. Post replacement images demonstrate no residual aortic valvular insufficiency or perivalvular leak. No change post bypass in mitral valve function.  Impressions:  - Post aortic valve replacement surgery, the prosthetic valve appears to be functioning normally. Excellent prosthetic aortic valve function without evidence for perivalvular leak. Other valves unchanged. No other significant changes from pre-bypass images. Ventricular function remains normal. 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  ------------------------------------------------------------------- Study data:   Study  status:  Routine.  Consent:  The risks, benefits, and alternatives to the procedure were explained to the patient and informed consent was obtained.  Procedure:  Sedation. General anesthesia was administered by anesthesiology staff. Transesophageal echocardiography. An adult multiplane transesophageal probe was inserted by the anesthesiologistwithout difficulty. Image quality was adequate.  Study completion:  There were no complications.          Intraoperative transesophageal echocardiography.  Birthdate:  Patient birthdate: February 09, 1941.  Age: Patient is 83 yr old.  Sex:  Gender: male.    BMI: 24.2 kg/m^2. Blood pressure:     118/72  Patient status:  Inpatient.  Study date:  Study date: 01/29/2017. Study time: 07:33 AM.  Location: Operating room.  -------------------------------------------------------------------  ------------------------------------------------------------------- Left ventricle:  Systolic function was normal. The estimated ejection fraction was in the range of 55% to 60%. Wall motion was normal; there were no regional wall motion abnormalities.  ------------------------------------------------------------------- Aortic valve:   Normal-sized, mildly calcified annulus. Trileaflet; mildly thickened leaflets. Cusp separation was moderately reduced. Valve mobility was restricted.  Doppler:  Transvalvular velocity was increased less than expected. There was moderate stenosis. There was mild regurgitation originating from the central coaptation point and directed towards the mitral anterior leaflet. VTI ratio of LVOT to aortic valve: 0.19. Valve area (VTI): 0.71 cm^2. Indexed valve area (VTI): 0.35 cm^2/m^2. Peak velocity ratio of LVOT to aortic valve: 0.24. Valve area (Vmax): 0.93 cm^2. Indexed valve area (Vmax): 0.46 cm^2/m^2. Mean velocity ratio of LVOT to aortic valve: 0.22. Valve area (Vmean): 0.83 cm^2. Indexed valve area (Vmean): 0.41 cm^2/m^2.    Mean gradient  (S): 22 mm Hg. Peak gradient (S): 35 mm Hg.  ------------------------------------------------------------------- Aorta:  The aorta was normal, not dilated, and only minimally diseased.  ------------------------------------------------------------------- Mitral valve:   Normal-sized annulus. Mildly thickened leaflets . Leaflet separation was normal. No echocardiographic evidence for prolapse.  No evidence of vegetation.  Doppler:   There was no evidence for stenosis.   There was trivial regurgitation.  ------------------------------------------------------------------- Left atrium:  The atrium was normal in size.  No evidence of thrombus in the atrial cavity or appendage.  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 There was Jordan tiny left-to-right shunt.  ------------------------------------------------------------------- Pulmonary veins:  Visualization of the pulmonary venous anatomy is incomplete, but Jordan significant abnormality is unlikely.  ------------------------------------------------------------------- Right ventricle:  The cavity size was normal. Wall thickness was normal. Systolic function was normal.  ------------------------------------------------------------------- Pulmonic valve:    Structurally normal valve.   Cusp separation was normal.  No evidence of vegetation.  Doppler:  There was trivial regurgitation around the PA catheter.  ------------------------------------------------------------------- Tricuspid valve:   Structurally normal valve.   Leaflet separation was normal.  No evidence of vegetation.  Doppler:  There was no regurgitation.  ------------------------------------------------------------------- Pulmonary artery:   The main pulmonary artery was  normal-sized.  ------------------------------------------------------------------- Right atrium:  The atrium was normal in size.  No evidence of thrombus.  ------------------------------------------------------------------- Pre  bypass:  Post bypass:  ------------------------------------------------------------------- Post procedure conclusions Left ventricle:  Limited post-CPB exam: Good , vigorous LV function. EF 55-60%. Wall motion appears normal. Aortic valve:  - Prosthetic aortic valve is well-seated in the Aortic annulus. No AI seen in LV outflow tract. Post replacement images demonstrate no residual aortic valvular insufficiency or perivalvular leak.  Mitral valve:  - No change post bypass in mitral valve function.  Ascending Aorta:  - The aorta was normal, not dilated, and only minimally diseased.  ------------------------------------------------------------------- Measurements  Left ventricle                           Value Stroke volume, 2D                        57    ml Stroke volume/bsa, 2D                    28    ml/m^2  LVOT                                     Value LVOT ID, S                               22    mm LVOT area                                3.8   cm^2 LVOT peak velocity, S                    72    cm/s LVOT mean velocity, S                    47.5  cm/s LVOT VTI, S                              14.9  cm  Aortic valve                             Value Aortic valve peak velocity, S            294   cm/s Aortic valve mean velocity, S            217   cm/s Aortic valve VTI, S                      80.3  cm Aortic mean gradient, S                  22    mm Hg Aortic peak gradient, S                  35    mm Hg VTI ratio, LVOT/AV                       0.19 Aortic valve area, VTI                   0.71  cm^2 Aortic valve area/bsa, VTI  0.35  cm^2/m^2 Velocity ratio, peak, LVOT/AV            0.24 Aortic valve area, peak velocity          0.93  cm^2 Aortic valve area/bsa, peak velocity     0.46  cm^2/m^2 Velocity ratio, mean, LVOT/AV            0.22 Aortic valve area, mean velocity         0.83  cm^2 Aortic valve area/bsa, mean velocity     0.41  cm^2/m^2  Legend: (L)  and  (H)  mark values outside specified reference range.  ------------------------------------------------------------------- Prepared and Electronically Authenticated by  Jonne Netters, MD 2018-08-21T18:26:22  MONITORS  LONG TERM MONITOR (3-14 DAYS) 06/06/2022  Narrative Patch Wear Time:  2 days and 10 hours (2023-12-19T08:38:02-499 to 2023-12-21T19:13:12-498)  Patient had Jordan min HR of 50 bpm, max HR of 133 bpm, and avg HR of 71 bpm. Predominant underlying rhythm was Sinus Rhythm. 2 Supraventricular Tachycardia runs occurred, the run with the fastest interval lasting 10 beats with Jordan max rate of 133 bpm (avg 122 bpm); the run with the fastest interval was also the longest. Isolated SVEs were rare (<1.0%), SVE Couplets were rare (<1.0%), and no SVE Triplets were present. Isolated VEs were rare (<1.0%), and no VE Couplets or VE Triplets were present.  Sinus bradycardia, NSR, sinus tachycardia, occasional PAC, brief PAT, rare PVC Jordan Hammond       ______________________________________________________________________________________________      Risk Assessment/Calculations:             Physical Exam:   VS:  BP 118/70 (BP Location: Right Arm, Patient Position: Sitting, Cuff Size: Normal)   Pulse 74   Ht 6' (1.829 m)   Wt 195 lb (88.5 kg)   SpO2 98%   BMI 26.45 kg/m    Wt Readings from Hammond 3 Encounters:  09/26/23 195 lb (88.5 kg)  08/28/23 194 lb (88 kg)  03/04/23 193 lb 6.4 oz (87.7 kg)    GEN: Well nourished, well developed in no acute distress NECK: No JVD; No carotid bruits CARDIAC: RRR, no murmurs, rubs, gallops RESPIRATORY:  Clear to auscultation without rales, wheezing or rhonchi  ABDOMEN: Soft, non-tender,  non-distended EXTREMITIES:  No edema; No deformity   ASSESSMENT AND PLAN: .    Exertional Dyspnea  - Order CBC, basic metabolic panel, and TSH. - obtained echocardiogram  Chest Tightness Symptoms suggest potential cardiac issues given coronary artery disease, aortic valve replacement, and previous bypass surgery. Differential includes anemia, thyroid dysfunction, electrolyte imbalances, or issues with heart valve or bypass grafts.  - Order PET stress test.  CAD s/p CABG: see #2. Continue aspirin and atorvastatin  History of AVR: Obtain repeat echocardiogram  Hyperlipidemia: On atorvastatin  Orthostatic Hypotension Confirmed with systolic pressure drop from 136 mmHg lying down to 111 mmHg standing. Common in older patients, not linked to antihypertensive medication. - Advise increased fluid and salt intake. - Suggest thigh-high compression stockings     Informed Consent   Shared Decision Making/Informed Consent The risks [chest pain, shortness of breath, cardiac arrhythmias, dizziness, blood pressure fluctuations, myocardial infarction, stroke/transient ischemic attack, nausea, vomiting, allergic reaction, radiation exposure, metallic taste sensation and life-threatening complications (estimated to be 1 in 10,000)], benefits (risk stratification, diagnosing coronary artery disease, treatment guidance) and alternatives of Jordan cardiac PET stress test were discussed in detail with Jordan Hammond and he agrees to proceed.     Dispo: Follow-up in 2 months  Signed,  Kirsi Hugh, [place]

## 2023-09-27 LAB — CBC
Hematocrit: 47 % (ref 37.5–51.0)
Hemoglobin: 15.5 g/dL (ref 13.0–17.7)
MCH: 30.9 pg (ref 26.6–33.0)
MCHC: 33 g/dL (ref 31.5–35.7)
MCV: 94 fL (ref 79–97)
Platelets: 206 10*3/uL (ref 150–450)
RBC: 5.01 x10E6/uL (ref 4.14–5.80)
RDW: 12.7 % (ref 11.6–15.4)
WBC: 4.8 10*3/uL (ref 3.4–10.8)

## 2023-09-27 LAB — BASIC METABOLIC PANEL WITH GFR
BUN/Creatinine Ratio: 17 (ref 10–24)
BUN: 22 mg/dL (ref 8–27)
CO2: 26 mmol/L (ref 20–29)
Calcium: 10.4 mg/dL — ABNORMAL HIGH (ref 8.6–10.2)
Chloride: 102 mmol/L (ref 96–106)
Creatinine, Ser: 1.29 mg/dL — ABNORMAL HIGH (ref 0.76–1.27)
Glucose: 92 mg/dL (ref 70–99)
Potassium: 5.2 mmol/L (ref 3.5–5.2)
Sodium: 142 mmol/L (ref 134–144)
eGFR: 55 mL/min/{1.73_m2} — ABNORMAL LOW (ref >59)

## 2023-09-27 LAB — TSH: TSH: 2.76 u[IU]/mL (ref 0.450–4.500)

## 2023-10-03 NOTE — Telephone Encounter (Signed)
 Pt unable to get in my chart. Wants a call with lab results

## 2023-10-23 ENCOUNTER — Other Ambulatory Visit: Payer: Self-pay | Admitting: Physician Assistant

## 2023-10-23 MED ORDER — NITROGLYCERIN 0.4 MG SL SUBL: 0.4000 mg | SUBLINGUAL_TABLET | SUBLINGUAL | 3 refills | Status: DC | PRN

## 2023-10-24 DIAGNOSIS — H9193 Unspecified hearing loss, bilateral: Secondary | ICD-10-CM | POA: Diagnosis not present

## 2023-10-24 DIAGNOSIS — E782 Mixed hyperlipidemia: Secondary | ICD-10-CM | POA: Diagnosis not present

## 2023-10-24 DIAGNOSIS — Z7689 Persons encountering health services in other specified circumstances: Secondary | ICD-10-CM | POA: Diagnosis not present

## 2023-10-24 DIAGNOSIS — G4700 Insomnia, unspecified: Secondary | ICD-10-CM | POA: Diagnosis not present

## 2023-10-24 DIAGNOSIS — I2510 Atherosclerotic heart disease of native coronary artery without angina pectoris: Secondary | ICD-10-CM | POA: Diagnosis not present

## 2023-10-24 DIAGNOSIS — B351 Tinea unguium: Secondary | ICD-10-CM | POA: Diagnosis not present

## 2023-10-24 DIAGNOSIS — E039 Hypothyroidism, unspecified: Secondary | ICD-10-CM | POA: Diagnosis not present

## 2023-10-24 DIAGNOSIS — R413 Other amnesia: Secondary | ICD-10-CM | POA: Diagnosis not present

## 2023-10-24 DIAGNOSIS — N1831 Chronic kidney disease, stage 3a: Secondary | ICD-10-CM | POA: Diagnosis not present

## 2023-10-28 ENCOUNTER — Telehealth: Payer: Self-pay | Admitting: Cardiology

## 2023-10-28 ENCOUNTER — Ambulatory Visit (HOSPITAL_COMMUNITY)
Admission: RE | Admit: 2023-10-28 | Discharge: 2023-10-28 | Disposition: A | Discharge status: Home / Self Care | Source: Ambulatory Visit | Attending: Internal Medicine | Admitting: Internal Medicine

## 2023-10-28 DIAGNOSIS — I3481 Nonrheumatic mitral (valve) annulus calcification: Secondary | ICD-10-CM | POA: Diagnosis not present

## 2023-10-28 DIAGNOSIS — R0602 Shortness of breath: Secondary | ICD-10-CM | POA: Insufficient documentation

## 2023-10-28 LAB — ECHOCARDIOGRAM COMPLETE
AV Mean grad: 6 mmHg
AV Peak grad: 11.8 mmHg
Ao pk vel: 1.72 m/s
Area-P 1/2: 2.34 cm2
S' Lateral: 3.31 cm

## 2023-10-28 NOTE — Telephone Encounter (Signed)
 New Message:     Patient's wife is calling. She is very concerned about the Hematoma patient has, where he had his Cath. He  said it is going always down in his legs. Very fatigued and short of breath.   Pt c/o Shortness Of Breath: STAT if SOB developed within the last 24 hours or pt is noticeably SOB on the phone  1. Are you currently SOB (can you hear that pt is SOB on the phone)? Not at this time 2. How long have you been experiencing SOB?  Since he saw  Hao  3. Are you SOB when sitting or when up moving around? When he moves around  4.  Are you currently experiencing any other symptoms? Fatigued- she thinks he needs to be seen

## 2023-10-28 NOTE — Telephone Encounter (Signed)
 Patient identification verified by 2 forms.   Called and spoke to patient  Patient states:  -SOB -Pain -Dizziness yesterday  -Bruising on left leg and in groin area.  -These symptoms have gotten worse since I was seen.               Interventions/Plan: -Appt made with Dr. Audery Blazing and placed on wait list  -Will get message to Dr. Audery Blazing for review.   Reviewed ED warning signs/precautions  Patient agrees with plan, no questions at this time

## 2023-10-29 ENCOUNTER — Ambulatory Visit (HOSPITAL_COMMUNITY)
Admission: RE | Admit: 2023-10-29 | Discharge: 2023-10-29 | Disposition: A | Discharge status: Home / Self Care | Source: Ambulatory Visit | Attending: Physician Assistant

## 2023-10-29 ENCOUNTER — Ambulatory Visit: Payer: Self-pay | Admitting: Physician Assistant

## 2023-10-29 ENCOUNTER — Ambulatory Visit (HOSPITAL_COMMUNITY)

## 2023-10-29 DIAGNOSIS — Z952 Presence of prosthetic heart valve: Secondary | ICD-10-CM | POA: Insufficient documentation

## 2023-10-29 DIAGNOSIS — R079 Chest pain, unspecified: Secondary | ICD-10-CM | POA: Insufficient documentation

## 2023-10-29 DIAGNOSIS — I700 Atherosclerosis of aorta: Secondary | ICD-10-CM | POA: Insufficient documentation

## 2023-10-29 DIAGNOSIS — I2510 Atherosclerotic heart disease of native coronary artery without angina pectoris: Secondary | ICD-10-CM | POA: Insufficient documentation

## 2023-10-29 MED ORDER — RUBIDIUM RB82 GENERATOR (RUBYFILL)
22.8500 | PACK | Freq: Once | INTRAVENOUS | Status: AC
  Administered 2023-10-29: 22.85 via INTRAVENOUS

## 2023-10-29 MED ORDER — RUBIDIUM RB82 GENERATOR (RUBYFILL)
22.7300 | PACK | Freq: Once | INTRAVENOUS | Status: AC
  Administered 2023-10-29: 22.73 via INTRAVENOUS

## 2023-10-29 MED ORDER — REGADENOSON 0.4 MG/5ML IV SOLN
0.4000 mg | Freq: Once | INTRAVENOUS | Status: AC
  Administered 2023-10-29: 0.4 mg via INTRAVENOUS

## 2023-10-29 MED ORDER — REGADENOSON 0.4 MG/5ML IV SOLN
INTRAVENOUS | Status: AC
  Filled 2023-10-29: qty 5

## 2023-10-29 NOTE — Telephone Encounter (Signed)
 Will send message to Dr. Audery Blazing.

## 2023-10-29 NOTE — Progress Notes (Signed)
 Pt. Tolerated lexi scan well.

## 2023-10-30 ENCOUNTER — Other Ambulatory Visit: Payer: Self-pay | Admitting: Physician Assistant

## 2023-10-30 LAB — NM PET CT CARDIAC PERFUSION MULTI W/ABSOLUTE BLOODFLOW
LV dias vol: 83 mL (ref 62–150)
LV sys vol: 37 mL
MBFR: 2.13
Nuc Rest EF: 55 %
Nuc Stress EF: 59 %
Peak HR: 83 {beats}/min
Rest HR: 67 {beats}/min
Rest MBF: 0.72 ml/g/min
Rest Nuclear Isotope Dose: 22.7 mCi
ST Depression (mm): 0 mm
Stress MBF: 1.53 ml/g/min
Stress Nuclear Isotope Dose: 22.9 mCi

## 2023-10-30 MED ORDER — NITROGLYCERIN 0.4 MG SL SUBL: 0.4000 mg | SUBLINGUAL_TABLET | SUBLINGUAL | 3 refills | Status: AC | PRN

## 2023-10-30 NOTE — Progress Notes (Signed)
 HPI: Follow-up coronary artery disease, aortic stenosis status post AVR and hyperlipidemia. Patient is status post coronary artery bypass and graft with a LIMA to the LAD, saphenous vein graft to the obtuse marginal and aortic valve replacement with pericardial tissue valve in 2018. Monitor December 2023 showed sinus rhythm with occasional PAC, brief PAT and rare PVC.  Echocardiogram repeated May 2025 and showed normal LV function, prior aortic valve replacement with mean gradient 6 mmHg, no aortic insufficiency and trace mitral regurgitation.  Stress PET May 2025 showed ejection fraction 55% and normal perfusion.  Patient seen recently with complaints of dyspnea, weakness and chest pain.  Added to my schedule today.  Laboratories April 2025 showed TSH 2.76, hemoglobin 15.5, and creatinine 1.29.  Since last seen patient has multiple complaints.  He complains of fatigue, weakness, increased dyspnea, dizziness with standing.  He also complains of chest pain.  It is in the substernal area without radiation or associated symptoms.  It can occur either at rest or with activities.  Can last 30 minutes to 30 seconds.  Current Outpatient Medications  Medication Sig Dispense Refill   ACETAMINOPHEN  PO Take 1,300 mg by mouth daily.     aspirin  EC 81 MG EC tablet Take 1 tablet (81 mg total) by mouth daily.     atorvastatin  (LIPITOR) 20 MG tablet Take 1 tablet (20 mg total) by mouth daily. 90 tablet 0   calcium  carbonate (CALCIUM  600) 600 MG TABS tablet Take 600 mg by mouth daily.     Cholecalciferol (VITAMIN D3) 5000 units CAPS Take 5,000 Units by mouth daily.     clonazePAM  (KLONOPIN ) 1 MG tablet Take 1 mg by mouth at bedtime.     Coenzyme Q10-Vitamin E (QUNOL ULTRA COQ10 PO) Take 1 tablet by mouth daily.     diphenhydrAMINE (BENADRYL) 50 MG tablet Take 25 mg by mouth at bedtime.     docusate sodium  (COLACE) 100 MG capsule Take 200 mg by mouth daily.     famotidine  (PEPCID ) 40 MG tablet Take 40 mg by mouth  daily.     Flaxseed Oil OIL Take 1,000 mg by mouth daily.     levothyroxine  (SYNTHROID ) 50 MCG tablet Take 50 mcg by mouth daily before breakfast.     MAGNESIUM  PO Take 1 tablet by mouth daily.     nitroGLYCERIN  (NITROSTAT ) 0.4 MG SL tablet Place 1 tablet (0.4 mg total) under the tongue every 5 (five) minutes as needed for chest pain. Do not take nitroglycerine within 48 hours of taking viagra 25 tablet 3   sildenafil (VIAGRA) 100 MG tablet Take 100 mg by mouth as needed for erectile dysfunction.     Simethicone  (GAS FREE EXTRA STRENGTH PO) Take 1 tablet by mouth as needed (gas).     traZODone (DESYREL) 50 MG tablet Take 50 mg by mouth at bedtime.     vitamin B-12 (CYANOCOBALAMIN ) 1000 MCG tablet Take 5,000 mcg by mouth daily.     No current facility-administered medications for this visit.     Past Medical History:  Diagnosis Date   Aortic atherosclerosis (HCC) 01/20/2017   Aortic stenosis 01/20/2017   CAD (coronary artery disease), native coronary artery 01/20/2017   GERD (gastroesophageal reflux disease)    Hiatal hernia    Hyperlipidemia    Kidney stones    Restless leg syndrome     Past Surgical History:  Procedure Laterality Date   AORTIC VALVE REPLACEMENT N/A 01/29/2017   Procedure: AORTIC VALVE REPLACEMENT (AVR);  Surgeon: Matt Song, Donata Fryer, MD;  Location: [facility] OR;  Service: Open Heart Surgery;  Laterality: N/A;   APPENDECTOMY     bowel obstruction surgery     CHOLECYSTECTOMY     CORONARY ARTERY BYPASS GRAFT N/A 01/29/2017   Procedure: CORONARY ARTERY BYPASS GRAFTING (CABG)  x two, using left internal mammary artery and right leg greater saphenous vein harvested endoscopically;  Surgeon: Heriberto London, MD;  Location: [facility] OR;  Service: Open Heart Surgery;  Laterality: N/A;   KNEE ARTHROSCOPY     lap nissan     LEFT HEART CATH AND CORONARY ANGIOGRAPHY N/A 01/21/2017   Procedure: LEFT HEART CATH AND CORONARY ANGIOGRAPHY;  Surgeon: Avanell Leigh, MD;  Location: [facility] INVASIVE CV  LAB;  Service: Cardiovascular;  Laterality: N/A;   RIGHT HEART CATH N/A 01/21/2017   Procedure: RIGHT HEART CATH;  Surgeon: Avanell Leigh, MD;  Location: [facility] INVASIVE CV LAB;  Service: Cardiovascular;  Laterality: N/A;   SHOULDER SURGERY     TEE WITHOUT CARDIOVERSION N/A 01/29/2017   Procedure: TRANSESOPHAGEAL ECHOCARDIOGRAM (TEE);  Surgeon: Matt Song, Donata Fryer, MD;  Location: [facility] OR;  Service: Open Heart Surgery;  Laterality: N/A;    Social History   Socioeconomic History   Marital status: Married    Spouse name: Not on file   Number of children: Not on file   Years of education: Not on file   Highest education level: Not on file  Occupational History   Not on file  Tobacco Use   Smoking status: Never   Smokeless tobacco: Never  Vaping Use   Vaping status: Never Used  Substance and Sexual Activity   Alcohol use: No   Drug use: No   Sexual activity: Yes  Other Topics Concern   Not on file  Social History Narrative   Divorced, remarried.  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 : No  Physical Activity: Not on file  Stress: No Stress Concern Present (09/28/2021)   Received from [facility], [facility]   [employer] of Occupational Health - Occupational Stress Questionnaire    Feeling of Stress : Not at all  Social Connections: Unknown (10/09/2021)   Received from [facility], [facility]   Social  Network    Social Network: Not on file  Intimate Partner Violence: Unknown (09/12/2021)   Received from [facility], [facility]   HITS    Physically Hurt: Not on file    Insult or Talk Down To: Not on file    Threaten Physical Harm: Not on file    Scream or Curse: Not on file    Family History  Problem Relation Age of Onset   CAD Father        s/p triple bypass   Dementia Mother     ROS: no fevers or chills, productive cough, hemoptysis, dysphasia, odynophagia, melena,  hematochezia, dysuria, hematuria, rash, seizure activity, orthopnea, PND, pedal edema, claudication. Remaining systems are negative.  Physical Exam: Well-developed well-nourished in no acute distress.  Skin is warm and dry.  HEENT is normal.  Neck is supple.  Chest is clear to auscultation with normal expansion.  Cardiovascular exam is regular rate and rhythm. 2/6 systolic murmur Abdominal exam nontender or distended. No masses palpated. Extremities show no edema. neuro grossly intact  EKG Interpretation Date/Time:  Thursday Oct 31 2023 16:04:47 EDT Ventricular Rate:  75 PR Interval:  180 QRS Duration:  90 QT Interval:  374 QTC Calculation: 417 R Axis:   32  Text Interpretation: Normal sinus rhythm Nonspecific ST abnormality Confirmed by Alexandria Angel ([id number]) on 10/31/2023 4:06:50 PM    A/P  1 chest pain, dyspnea, fatigue-recent echocardiogram showed normally functioning aortic valve and normal LV function.  Stress PET normal.  However patient and wife are extremely concerned about his symptoms.  His chest pain has been persistent.  We will therefore plan to proceed with definitive evaluation.  Plan cardiac catheterization.  The risk and benefits including myocardial infarction, CVA and death discussed and he agrees to proceed.  2 coronary artery disease status post coronary bypass graft-continue aspirin  and statin.  3 Hyperlipidemia-continue statin.  4 prior aortic valve replacement-normally  functioning valve on recent echocardiogram.  Continue SBE prophylaxis.  5 dizziness-patient describes orthostatic symptoms.  I have asked him to stay hydrated and increase sodium intake and we will follow.  These have improved recently.  Alexandria Angel, MD

## 2023-10-30 NOTE — Telephone Encounter (Signed)
 Spoke with pt, Follow up scheduled

## 2023-10-30 NOTE — H&P (View-Only) (Signed)
 HPI: Follow-up coronary artery disease, aortic stenosis status post AVR and hyperlipidemia. Patient is status post coronary artery bypass and graft with a LIMA to the LAD, saphenous vein graft to the obtuse marginal and aortic valve replacement with pericardial tissue valve in 2018. Monitor December 2023 showed sinus rhythm with occasional PAC, brief PAT and rare PVC.  Echocardiogram repeated May 2025 and showed normal LV function, prior aortic valve replacement with mean gradient 6 mmHg, no aortic insufficiency and trace mitral regurgitation.  Stress PET May 2025 showed ejection fraction 55% and normal perfusion.  Patient seen recently with complaints of dyspnea, weakness and chest pain.  Added to my schedule today.  Laboratories April 2025 showed TSH 2.76, hemoglobin 15.5, and creatinine 1.29.  Since last seen patient has multiple complaints.  He complains of fatigue, weakness, increased dyspnea, dizziness with standing.  He also complains of chest pain.  It is in the substernal area without radiation or associated symptoms.  It can occur either at rest or with activities.  Can last 30 minutes to 30 seconds.  Current Outpatient Medications  Medication Sig Dispense Refill   ACETAMINOPHEN  PO Take 1,300 mg by mouth daily.     aspirin  EC 81 MG EC tablet Take 1 tablet (81 mg total) by mouth daily.     atorvastatin  (LIPITOR) 20 MG tablet Take 1 tablet (20 mg total) by mouth daily. 90 tablet 0   calcium  carbonate (CALCIUM  600) 600 MG TABS tablet Take 600 mg by mouth daily.     Cholecalciferol (VITAMIN D3) 5000 units CAPS Take 5,000 Units by mouth daily.     clonazePAM  (KLONOPIN ) 1 MG tablet Take 1 mg by mouth at bedtime.     Coenzyme Q10-Vitamin E (QUNOL ULTRA COQ10 PO) Take 1 tablet by mouth daily.     diphenhydrAMINE (BENADRYL) 50 MG tablet Take 25 mg by mouth at bedtime.     docusate sodium  (COLACE) 100 MG capsule Take 200 mg by mouth daily.     famotidine  (PEPCID ) 40 MG tablet Take 40 mg by mouth  daily.     Flaxseed Oil OIL Take 1,000 mg by mouth daily.     levothyroxine  (SYNTHROID ) 50 MCG tablet Take 50 mcg by mouth daily before breakfast.     MAGNESIUM  PO Take 1 tablet by mouth daily.     nitroGLYCERIN  (NITROSTAT ) 0.4 MG SL tablet Place 1 tablet (0.4 mg total) under the tongue every 5 (five) minutes as needed for chest pain. Do not take nitroglycerine within 48 hours of taking viagra 25 tablet 3   sildenafil (VIAGRA) 100 MG tablet Take 100 mg by mouth as needed for erectile dysfunction.     Simethicone  (GAS FREE EXTRA STRENGTH PO) Take 1 tablet by mouth as needed (gas).     traZODone (DESYREL) 50 MG tablet Take 50 mg by mouth at bedtime.     vitamin B-12 (CYANOCOBALAMIN ) 1000 MCG tablet Take 5,000 mcg by mouth daily.     No current facility-administered medications for this visit.     Past Medical History:  Diagnosis Date   Aortic atherosclerosis (HCC) 01/20/2017   Aortic stenosis 01/20/2017   CAD (coronary artery disease), native coronary artery 01/20/2017   GERD (gastroesophageal reflux disease)    Hiatal hernia    Hyperlipidemia    Kidney stones    Restless leg syndrome     Past Surgical History:  Procedure Laterality Date   AORTIC VALVE REPLACEMENT N/A 01/29/2017   Procedure: AORTIC VALVE REPLACEMENT (AVR);  Surgeon: Matt Song, Donata Fryer, MD;  Location: [facility] OR;  Service: Open Heart Surgery;  Laterality: N/A;   APPENDECTOMY     bowel obstruction surgery     CHOLECYSTECTOMY     CORONARY ARTERY BYPASS GRAFT N/A 01/29/2017   Procedure: CORONARY ARTERY BYPASS GRAFTING (CABG)  x two, using left internal mammary artery and right leg greater saphenous vein harvested endoscopically;  Surgeon: Heriberto London, MD;  Location: [facility] OR;  Service: Open Heart Surgery;  Laterality: N/A;   KNEE ARTHROSCOPY     lap nissan     LEFT HEART CATH AND CORONARY ANGIOGRAPHY N/A 01/21/2017   Procedure: LEFT HEART CATH AND CORONARY ANGIOGRAPHY;  Surgeon: Avanell Leigh, MD;  Location: [facility] INVASIVE CV  LAB;  Service: Cardiovascular;  Laterality: N/A;   RIGHT HEART CATH N/A 01/21/2017   Procedure: RIGHT HEART CATH;  Surgeon: Avanell Leigh, MD;  Location: [facility] INVASIVE CV LAB;  Service: Cardiovascular;  Laterality: N/A;   SHOULDER SURGERY     TEE WITHOUT CARDIOVERSION N/A 01/29/2017   Procedure: TRANSESOPHAGEAL ECHOCARDIOGRAM (TEE);  Surgeon: Matt Song, Donata Fryer, MD;  Location: [facility] OR;  Service: Open Heart Surgery;  Laterality: N/A;    Social History   Socioeconomic History   Marital status: Married    Spouse name: Not on file   Number of children: Not on file   Years of education: Not on file   Highest education level: Not on file  Occupational History   Not on file  Tobacco Use   Smoking status: Never   Smokeless tobacco: Never  Vaping Use   Vaping status: Never Used  Substance and Sexual Activity   Alcohol use: No   Drug use: No   Sexual activity: Yes  Other Topics Concern   Not on file  Social History Narrative   Divorced, remarried.  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 : No  Physical Activity: Not on file  Stress: No Stress Concern Present (09/28/2021)   Received from [facility], [facility]   [employer] of Occupational Health - Occupational Stress Questionnaire    Feeling of Stress : Not at all  Social Connections: Unknown (10/09/2021)   Received from [facility], [facility]   Social  Network    Social Network: Not on file  Intimate Partner Violence: Unknown (09/12/2021)   Received from [facility], [facility]   HITS    Physically Hurt: Not on file    Insult or Talk Down To: Not on file    Threaten Physical Harm: Not on file    Scream or Curse: Not on file    Family History  Problem Relation Age of Onset   CAD Father        s/p triple bypass   Dementia Mother     ROS: no fevers or chills, productive cough, hemoptysis, dysphasia, odynophagia, melena,  hematochezia, dysuria, hematuria, rash, seizure activity, orthopnea, PND, pedal edema, claudication. Remaining systems are negative.  Physical Exam: Well-developed well-nourished in no acute distress.  Skin is warm and dry.  HEENT is normal.  Neck is supple.  Chest is clear to auscultation with normal expansion.  Cardiovascular exam is regular rate and rhythm. 2/6 systolic murmur Abdominal exam nontender or distended. No masses palpated. Extremities show no edema. neuro grossly intact  EKG Interpretation Date/Time:  Thursday Oct 31 2023 16:04:47 EDT Ventricular Rate:  75 PR Interval:  180 QRS Duration:  90 QT Interval:  374 QTC Calculation: 417 R Axis:   32  Text Interpretation: Normal sinus rhythm Nonspecific ST abnormality Confirmed by Alexandria Angel ([id number]) on 10/31/2023 4:06:50 PM    A/P  1 chest pain, dyspnea, fatigue-recent echocardiogram showed normally functioning aortic valve and normal LV function.  Stress PET normal.  However patient and wife are extremely concerned about his symptoms.  His chest pain has been persistent.  We will therefore plan to proceed with definitive evaluation.  Plan cardiac catheterization.  The risk and benefits including myocardial infarction, CVA and death discussed and he agrees to proceed.  2 coronary artery disease status post coronary bypass graft-continue aspirin  and statin.  3 Hyperlipidemia-continue statin.  4 prior aortic valve replacement-normally  functioning valve on recent echocardiogram.  Continue SBE prophylaxis.  5 dizziness-patient describes orthostatic symptoms.  I have asked him to stay hydrated and increase sodium intake and we will follow.  These have improved recently.  Alexandria Angel, MD

## 2023-10-30 NOTE — Telephone Encounter (Signed)
 Spoke with wife and husband in detail regarding both echo and PET stress which came back normal. Wife and husband both believe there is something wrong given recent symptom, I think we may be stuck considering a left and right heart cath. Patient is currently scheduled to see you tomorrow afternoon at [facility]

## 2023-10-30 NOTE — Telephone Encounter (Signed)
 Left message for pt to call to schedule follow up tomorrow in my held spot

## 2023-10-31 ENCOUNTER — Encounter: Payer: Self-pay | Admitting: Cardiology

## 2023-10-31 ENCOUNTER — Ambulatory Visit: Attending: Cardiology | Admitting: Cardiology

## 2023-10-31 VITALS — BP 122/66 | HR 80 | Wt 196.4 lb

## 2023-10-31 DIAGNOSIS — R079 Chest pain, unspecified: Secondary | ICD-10-CM | POA: Diagnosis not present

## 2023-10-31 DIAGNOSIS — E785 Hyperlipidemia, unspecified: Secondary | ICD-10-CM | POA: Diagnosis not present

## 2023-10-31 DIAGNOSIS — Z952 Presence of prosthetic heart valve: Secondary | ICD-10-CM

## 2023-10-31 DIAGNOSIS — I25709 Atherosclerosis of coronary artery bypass graft(s), unspecified, with unspecified angina pectoris: Secondary | ICD-10-CM

## 2023-10-31 NOTE — Patient Instructions (Addendum)
   Testing/Procedures      Cardiac Catheterization   You are scheduled for a Cardiac Catheterization on Wednesday, May 28 with Dr. Peter Swaziland.  1. Please arrive at the [facility] (Main Entrance A) at [facility]: [address] [place], [place] [address] at 9:30 AM (This time is 2 hour(s) before your procedure to ensure your preparation).   Free valet parking service is available. You will check in at ADMITTING. The support person will be asked to wait in the waiting room.  It is OK to have someone drop you off and come back when you are ready to be discharged.        Special note: Every effort is made to have your procedure done on time. Please understand that emergencies sometimes delay scheduled procedures.  2. Diet: Do not eat solid foods after midnight.  You may have clear liquids until 5 AM the day of the procedure.  3. Labs: You will need to have blood drawn on today  4. Medication instructions in preparation for your procedure:  On the morning of your procedure, take Aspirin  81 mg and any morning medicines NOT listed above.  You may use sips of water.  5. Plan to go home the same day, you will only stay overnight if medically necessary. 6. You MUST have a responsible adult to drive you home. 7. An adult MUST be with you the first 24 hours after you arrive home. 8. Bring a current list of your medications, and the last time and date medication taken. 9. Bring ID and current insurance cards. 10.Please wear clothes that are easy to get on and off and wear slip-on shoes.  Thank you for allowing us  to care for you!   -- [facility]   Follow-Up: At [facility], you and your health needs are our priority.  As part of our continuing mission to provide you with exceptional heart care, our providers are all part of one team.  This team includes your primary Cardiologist (physician) and Advanced Practice Providers or APPs  (Physician Assistants and Nurse Practitioners) who all work together to provide you with the care you need, when you need it.  Your next appointment:    As scheduled  Provider:   Alexandria Angel, MD

## 2023-11-01 ENCOUNTER — Ambulatory Visit: Payer: Self-pay | Admitting: Cardiology

## 2023-11-01 LAB — BASIC METABOLIC PANEL WITH GFR
BUN/Creatinine Ratio: 18 (ref 10–24)
BUN: 22 mg/dL (ref 8–27)
CO2: 22 mmol/L (ref 20–29)
Calcium: 9.7 mg/dL (ref 8.6–10.2)
Chloride: 105 mmol/L (ref 96–106)
Creatinine, Ser: 1.23 mg/dL (ref 0.76–1.27)
Glucose: 105 mg/dL — ABNORMAL HIGH (ref 70–99)
Potassium: 4.1 mmol/L (ref 3.5–5.2)
Sodium: 140 mmol/L (ref 134–144)
eGFR: 58 mL/min/{1.73_m2} — ABNORMAL LOW (ref >59)

## 2023-11-01 LAB — CBC
Hematocrit: 43.2 % (ref 37.5–51.0)
Hemoglobin: 14.6 g/dL (ref 13.0–17.7)
MCH: 31.7 pg (ref 26.6–33.0)
MCHC: 33.8 g/dL (ref 31.5–35.7)
MCV: 94 fL (ref 79–97)
Platelets: 176 10*3/uL (ref 150–450)
RBC: 4.6 x10E6/uL (ref 4.14–5.80)
RDW: 12.1 % (ref 11.6–15.4)
WBC: 5.3 10*3/uL (ref 3.4–10.8)

## 2023-11-05 ENCOUNTER — Telehealth: Payer: Self-pay | Admitting: *Deleted

## 2023-11-05 NOTE — Telephone Encounter (Signed)
 Cardiac Catheterization scheduled at [facility] for: Wednesday Nov 06, 2023 11:30 AM Arrival time [facility] Main Entrance A at: 9:30 AM  Nothing to eat after midnight prior to procedure, clear liquids until 5 AM day of procedure.  Medication instructions: -Usual morning medications can be taken with sips of water including aspirin  81 mg.  Plan to go home the same day, you will only stay overnight if medically necessary.  You must have responsible adult to drive you home.  Someone must be with you the first 24 hours after you arrive home.  Reviewed procedure instructions with patient.

## 2023-11-06 ENCOUNTER — Encounter (HOSPITAL_COMMUNITY)
Admission: RE | Disposition: A | Discharge status: Home / Self Care | Payer: Self-pay | Source: Home / Self Care | Attending: Cardiology

## 2023-11-06 ENCOUNTER — Ambulatory Visit (HOSPITAL_COMMUNITY)
Admission: RE | Admit: 2023-11-06 | Discharge: 2023-11-06 | Disposition: A | Discharge status: Home / Self Care | Attending: Cardiology | Admitting: Cardiology

## 2023-11-06 ENCOUNTER — Other Ambulatory Visit: Payer: Self-pay

## 2023-11-06 ENCOUNTER — Encounter (HOSPITAL_COMMUNITY): Payer: Self-pay | Admitting: Cardiology

## 2023-11-06 DIAGNOSIS — I350 Nonrheumatic aortic (valve) stenosis: Secondary | ICD-10-CM | POA: Diagnosis not present

## 2023-11-06 DIAGNOSIS — Z951 Presence of aortocoronary bypass graft: Secondary | ICD-10-CM | POA: Diagnosis not present

## 2023-11-06 DIAGNOSIS — Z953 Presence of xenogenic heart valve: Secondary | ICD-10-CM | POA: Diagnosis not present

## 2023-11-06 DIAGNOSIS — E785 Hyperlipidemia, unspecified: Secondary | ICD-10-CM | POA: Insufficient documentation

## 2023-11-06 DIAGNOSIS — Z79899 Other long term (current) drug therapy: Secondary | ICD-10-CM | POA: Insufficient documentation

## 2023-11-06 DIAGNOSIS — I25118 Atherosclerotic heart disease of native coronary artery with other forms of angina pectoris: Secondary | ICD-10-CM | POA: Insufficient documentation

## 2023-11-06 DIAGNOSIS — I2582 Chronic total occlusion of coronary artery: Secondary | ICD-10-CM | POA: Insufficient documentation

## 2023-11-06 DIAGNOSIS — R079 Chest pain, unspecified: Secondary | ICD-10-CM

## 2023-11-06 DIAGNOSIS — Z7982 Long term (current) use of aspirin: Secondary | ICD-10-CM | POA: Diagnosis not present

## 2023-11-06 DIAGNOSIS — R42 Dizziness and giddiness: Secondary | ICD-10-CM | POA: Diagnosis not present

## 2023-11-06 SURGERY — LEFT HEART CATH AND CORS/GRAFTS ANGIOGRAPHY
Anesthesia: LOCAL

## 2023-11-06 MED ORDER — SODIUM CHLORIDE 0.9 % WEIGHT BASED INFUSION
3.0000 mL/kg/h | INTRAVENOUS | Status: AC
  Administered 2023-11-06: 3 mL/kg/h via INTRAVENOUS

## 2023-11-06 MED ORDER — HEPARIN (PORCINE) IN NACL 1000-0.9 UT/500ML-% IV SOLN
INTRAVENOUS | Status: DC | PRN
  Administered 2023-11-06 (×2): 500 mL

## 2023-11-06 MED ORDER — ONDANSETRON HCL 4 MG/2ML IJ SOLN: 4.0000 mg | Freq: Four times a day (QID) | INTRAMUSCULAR | Status: DC | PRN

## 2023-11-06 MED ORDER — HYDRALAZINE HCL 20 MG/ML IJ SOLN: 10.0000 mg | INTRAMUSCULAR | Status: DC | PRN

## 2023-11-06 MED ORDER — MIDAZOLAM HCL 2 MG/2ML IJ SOLN
INTRAMUSCULAR | Status: AC
  Filled 2023-11-06: qty 2

## 2023-11-06 MED ORDER — LIDOCAINE HCL (PF) 1 % IJ SOLN
INTRAMUSCULAR | Status: DC | PRN
  Administered 2023-11-06: 2 mL via INTRADERMAL

## 2023-11-06 MED ORDER — ASPIRIN 81 MG PO CHEW: 81.0000 mg | CHEWABLE_TABLET | ORAL | Status: DC

## 2023-11-06 MED ORDER — LIDOCAINE HCL (PF) 1 % IJ SOLN
INTRAMUSCULAR | Status: AC
  Filled 2023-11-06: qty 30

## 2023-11-06 MED ORDER — VERAPAMIL HCL 2.5 MG/ML IV SOLN
INTRAVENOUS | Status: DC | PRN
  Administered 2023-11-06: 10 mL via INTRA_ARTERIAL

## 2023-11-06 MED ORDER — SODIUM CHLORIDE 0.9 % WEIGHT BASED INFUSION: 1.0000 mL/kg/h | INTRAVENOUS | Status: DC

## 2023-11-06 MED ORDER — ACETAMINOPHEN 325 MG PO TABS: 650.0000 mg | ORAL_TABLET | ORAL | Status: DC | PRN

## 2023-11-06 MED ORDER — VERAPAMIL HCL 2.5 MG/ML IV SOLN
INTRAVENOUS | Status: AC
Start: 2023-11-06 — End: ?
  Filled 2023-11-06: qty 2

## 2023-11-06 MED ORDER — HEPARIN SODIUM (PORCINE) 1000 UNIT/ML IJ SOLN
INTRAMUSCULAR | Status: AC
Start: 2023-11-06 — End: ?
  Filled 2023-11-06: qty 10

## 2023-11-06 MED ORDER — HEPARIN SODIUM (PORCINE) 1000 UNIT/ML IJ SOLN
INTRAMUSCULAR | Status: DC | PRN
  Administered 2023-11-06: 4000 [IU] via INTRAVENOUS

## 2023-11-06 MED ORDER — MIDAZOLAM HCL 2 MG/2ML IJ SOLN
INTRAMUSCULAR | Status: DC | PRN
  Administered 2023-11-06: 2 mg via INTRAVENOUS

## 2023-11-06 MED ORDER — IOHEXOL 350 MG/ML SOLN
INTRAVENOUS | Status: DC | PRN
Start: 2023-11-06 — End: 2023-11-06
  Administered 2023-11-06: 45 mL

## 2023-11-06 SURGICAL SUPPLY — 9 items
CATH INFINITI 5 FR IM (CATHETERS) IMPLANT
CATH INFINITI 5FR MULTPACK ANG (CATHETERS) IMPLANT
DEVICE RAD COMP TR BAND LRG (VASCULAR PRODUCTS) IMPLANT
ELECT DEFIB PAD ADLT CADENCE (PAD) IMPLANT
GLIDESHEATH SLEND SS 6F .021 (SHEATH) IMPLANT
GUIDEWIRE INQWIRE 1.5J.035X260 (WIRE) IMPLANT
PACK CARDIAC CATHETERIZATION (CUSTOM PROCEDURE TRAY) ×1 IMPLANT
SET ATX-X65L (MISCELLANEOUS) IMPLANT
SHEATH PROBE COVER 6X72 (BAG) IMPLANT

## 2023-11-06 NOTE — Discharge Instructions (Signed)

## 2023-11-06 NOTE — Progress Notes (Signed)
 TR BAND REMOVAL  LOCATION:    left radial  DEFLATED PER PROTOCOL:    Yes.    TIME BAND OFF / DRESSING APPLIED:    1428 guaze dressing     SITE UPON ARRIVAL:    Level 0  SITE AFTER BAND REMOVAL:    Level 0  CIRCULATION SENSATION AND MOVEMENT:    Within Normal Limits   Yes.    COMMENTS:   no new issues noted

## 2023-11-06 NOTE — Interval H&P Note (Signed)
 History and Physical Interval Note:  11/06/2023 11:15 AM  Jordan Hammond  has presented today for surgery, with the diagnosis of CHEST PAIN.  The various methods of treatment have been discussed with the patient and family. After consideration of risks, benefits and other options for treatment, the patient has consented to  Procedure(s): LEFT HEART CATH AND CORS/GRAFTS ANGIOGRAPHY (N/A) as a surgical intervention.  The patient's history has been reviewed, patient examined, no change in status, stable for surgery.  I have reviewed the patient's chart and labs.  Questions were answered to the patient's satisfaction.    Cath Lab Visit (complete for each Cath Lab visit)  Clinical Evaluation Leading to the Procedure:   ACS: No.  Non-ACS:    Anginal Classification: CCS III  Anti-ischemic medical therapy: No Therapy  Non-Invasive Test Results: Low-risk stress test findings: cardiac mortality <1%/year  Prior CABG: Previous CABG       Donata Fryer [facility] 11/06/2023 11:16 AM

## 2023-11-12 ENCOUNTER — Ambulatory Visit (HOSPITAL_COMMUNITY)

## 2023-12-16 DIAGNOSIS — H02423 Myogenic ptosis of bilateral eyelids: Secondary | ICD-10-CM | POA: Diagnosis not present

## 2023-12-16 DIAGNOSIS — H02831 Dermatochalasis of right upper eyelid: Secondary | ICD-10-CM | POA: Diagnosis not present

## 2023-12-16 DIAGNOSIS — H02413 Mechanical ptosis of bilateral eyelids: Secondary | ICD-10-CM | POA: Diagnosis not present

## 2023-12-16 DIAGNOSIS — Z01818 Encounter for other preprocedural examination: Secondary | ICD-10-CM | POA: Diagnosis not present

## 2023-12-16 DIAGNOSIS — H02421 Myogenic ptosis of right eyelid: Secondary | ICD-10-CM | POA: Diagnosis not present

## 2023-12-16 DIAGNOSIS — H02412 Mechanical ptosis of left eyelid: Secondary | ICD-10-CM | POA: Diagnosis not present

## 2023-12-16 DIAGNOSIS — H57813 Brow ptosis, bilateral: Secondary | ICD-10-CM | POA: Diagnosis not present

## 2023-12-16 DIAGNOSIS — H02411 Mechanical ptosis of right eyelid: Secondary | ICD-10-CM | POA: Diagnosis not present

## 2023-12-16 DIAGNOSIS — H02422 Myogenic ptosis of left eyelid: Secondary | ICD-10-CM | POA: Diagnosis not present

## 2023-12-16 DIAGNOSIS — H02834 Dermatochalasis of left upper eyelid: Secondary | ICD-10-CM | POA: Diagnosis not present

## 2023-12-16 DIAGNOSIS — H0279 Other degenerative disorders of eyelid and periocular area: Secondary | ICD-10-CM | POA: Diagnosis not present

## 2023-12-16 DIAGNOSIS — H53483 Generalized contraction of visual field, bilateral: Secondary | ICD-10-CM | POA: Diagnosis not present

## 2023-12-24 ENCOUNTER — Other Ambulatory Visit (HOSPITAL_COMMUNITY)

## 2023-12-30 NOTE — Progress Notes (Deleted)
 HPI: Follow-up coronary artery disease, aortic stenosis status post AVR and hyperlipidemia. Patient is status post coronary artery bypass and graft with a LIMA to the LAD, saphenous vein graft to the obtuse marginal and aortic valve replacement with pericardial tissue valve in 2018. Monitor December 2023 showed sinus rhythm with occasional PAC, brief PAT and rare PVC.  Echocardiogram repeated May 2025 and showed normal LV function, prior aortic valve replacement with mean gradient 6 mmHg, no aortic insufficiency and trace mitral regurgitation.  Stress PET May 2025 showed ejection fraction 55% and normal perfusion.  Patient seen recently with complaints of dyspnea, weakness and chest pain.  Added to my schedule today.  Cardiac catheterization May 2025 showed 50% left main, 90% mid LAD, occluded circumflex and 25% right coronary artery; LIMA to the LAD patent and saphenous vein graft to first marginal patent.  Medical therapy recommended.  Since last seen   Current Outpatient Medications  Medication Sig Dispense Refill   acetaminophen  (TYLENOL ) 325 MG tablet Take 650 mg by mouth in the morning.     aspirin  EC 81 MG EC tablet Take 1 tablet (81 mg total) by mouth daily.     atorvastatin  (LIPITOR) 20 MG tablet Take 1 tablet (20 mg total) by mouth daily. (Patient taking differently: Take 20 mg by mouth at bedtime.) 90 tablet 0   calcium  carbonate (CALCIUM  600) 600 MG TABS tablet Take 600 mg by mouth daily. With D3     Cholecalciferol (VITAMIN D3) 5000 units CAPS Take 5,000 Units by mouth daily.     clonazePAM  (KLONOPIN ) 1 MG tablet Take 1 mg by mouth at bedtime.     Coenzyme Q10 (CO Q 10 PO) Take 300 mg by mouth daily.     Cyanocobalamin  (VITAMIN B-12) 5000 MCG TBDP Take 5,000 mcg by mouth in the morning.     diphenhydrAMINE (BENADRYL) 50 MG tablet Take 50 mg by mouth at bedtime. Sleepaid     docusate sodium  (COLACE) 100 MG capsule Take 100 mg by mouth 2 (two) times daily.     famotidine  (PEPCID ) 20  MG tablet Take 20 mg by mouth daily as needed for heartburn or indigestion.     Flaxseed Oil OIL Take 1,000 mg by mouth in the morning.     Homeopathic Products (LEG CRAMPS) TABS Take 1 tablet by mouth at bedtime. Magnesium      levothyroxine  (SYNTHROID ) 50 MCG tablet Take 50 mcg by mouth daily before breakfast.     nitroGLYCERIN  (NITROSTAT ) 0.4 MG SL tablet Place 1 tablet (0.4 mg total) under the tongue every 5 (five) minutes as needed for chest pain. Do not take nitroglycerine within 48 hours of taking viagra 25 tablet 3   OVER THE COUNTER MEDICATION Place 2 sprays into both nostrils as needed (Allergy). [facility] allergy relief     sildenafil (VIAGRA) 100 MG tablet Take 100 mg by mouth as needed for erectile dysfunction.     Simethicone  (GAS FREE EXTRA STRENGTH PO) Take 125 mg by mouth daily as needed (gas).     traZODone  (DESYREL ) 50 MG tablet Take 50 mg by mouth at bedtime.     No current facility-administered medications for this visit.     Past Medical History:  Diagnosis Date   Aortic atherosclerosis (HCC) 01/20/2017   Aortic stenosis 01/20/2017   CAD (coronary artery disease), native coronary artery 01/20/2017   GERD (gastroesophageal reflux disease)    Hiatal hernia    Hyperlipidemia    Kidney stones  Restless leg syndrome     Past Surgical History:  Procedure Laterality Date   AORTIC VALVE REPLACEMENT N/A 01/29/2017   Procedure: AORTIC VALVE REPLACEMENT (AVR);  Surgeon: Fleeta Ochoa, Maude, MD;  Location: [facility] OR;  Service: Open Heart Surgery;  Laterality: N/A;   APPENDECTOMY     bowel obstruction surgery     CHOLECYSTECTOMY     CORONARY ARTERY BYPASS GRAFT N/A 01/29/2017   Procedure: CORONARY ARTERY BYPASS GRAFTING (CABG)  x two, using left internal mammary artery and right leg greater saphenous vein harvested endoscopically;  Surgeon: Fleeta Ochoa Maude, MD;  Location: [facility] OR;  Service: Open Heart Surgery;  Laterality: N/A;   KNEE ARTHROSCOPY     lap nissan     LEFT HEART CATH  AND CORONARY ANGIOGRAPHY N/A 01/21/2017   Procedure: LEFT HEART CATH AND CORONARY ANGIOGRAPHY;  Surgeon: Court Dorn PARAS, MD;  Location: [facility] INVASIVE CV LAB;  Service: Cardiovascular;  Laterality: N/A;   LEFT HEART CATH AND CORS/GRAFTS ANGIOGRAPHY N/A 11/06/2023   Procedure: LEFT HEART CATH AND CORS/GRAFTS ANGIOGRAPHY;  Surgeon: Swazil[place], Peter M, MD;  Location: [facility] INVASIVE CV LAB;  Service: Cardiovascular;  Laterality: N/A;   RIGHT HEART CATH N/A 01/21/2017   Procedure: RIGHT HEART CATH;  Surgeon: Court Dorn PARAS, MD;  Location: [facility] INVASIVE CV LAB;  Service: Cardiovascular;  Laterality: N/A;   SHOULDER SURGERY     TEE WITHOUT CARDIOVERSION N/A 01/29/2017   Procedure: TRANSESOPHAGEAL ECHOCARDIOGRAM (TEE);  Surgeon: Fleeta Ochoa, Maude, MD;  Location: [facility] OR;  Service: Open Heart Surgery;  Laterality: N/A;    Social History   Socioeconomic History   Marital status: Married    Spouse name: Not on file   Number of children: Not on file   Years of education: Not on file   Highest education level: Not on file  Occupational History   Not on file  Tobacco Use   Smoking status: Never   Smokeless tobacco: Never  Vaping Use   Vaping status: Never Used  Substance and Sexual Activity   Alcohol use: No   Drug use: No   Sexual activity: Yes  Other Topics Concern   Not on file  Social History Narrative   Divorced, remarried.  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 : No  Physical Activity: Not on file  Stress: No Stress Concern Present (09/28/2021)   Received from [facility] - Occupational Stress Questionnaire    Feeling of Stress : Not at all  Social Connections: Unknown (10/09/2021)   Received from [facility]   Social Network    Social Network: Not on file  Intimate Partner Violence: Unknown (09/12/2021)   Received from [facility]   HITS    Physically Hurt: Not on file    Insult or Talk Down To: Not on file  Threaten Physical Harm: Not on file    Scream or Curse: Not on file    Family History  Problem Relation Age of Onset   CAD Father        s/p triple bypass   Dementia Mother     ROS: no fevers or chills, productive cough, hemoptysis, dysphasia, odynophagia, melena, hematochezia, dysuria, hematuria, rash, seizure activity, orthopnea, PND, pedal edema, claudication. Remaining systems are negative.  Physical Exam: Well-developed well-nourished in no acute distress.  Skin is warm and dry.  HEENT is normal.  Neck is supple.  Chest is clear to auscultation with normal expansion.  Cardiovascular exam is regular rate and rhythm.  Abdominal exam nontender or distended. No masses palpated. Extremities show no edema. neuro grossly intact  ECG- personally reviewed  A/P  1 coronary artery disease status post coronary bypass graft-recent catheterization revealed patent grafts.  Plan to continue aspirin  and statin.  2 hyperlipidemia-continue statin.  3 status post aortic valve replacement-prosthetic valve is functioning normally on most recent echocardiogram.  Continue SBE prophylaxis.  4 history of orthostasis-symptoms have improved with increased hydration and increase sodium intake.  5 history of chest pain/dyspnea/fatigue-recent catheterization revealed patent grafts and echocardiogram showed normally functioning prosthetic valve.  Redell Shallow,  MD

## 2023-12-31 ENCOUNTER — Observation Stay (HOSPITAL_COMMUNITY)
Admission: EM | Admit: 2023-12-31 | Discharge: 2024-01-01 | Disposition: A | Discharge status: Home / Self Care | Attending: Family Medicine | Admitting: Family Medicine

## 2023-12-31 ENCOUNTER — Encounter (HOSPITAL_COMMUNITY): Payer: Self-pay | Admitting: Internal Medicine

## 2023-12-31 ENCOUNTER — Other Ambulatory Visit: Payer: Self-pay

## 2023-12-31 ENCOUNTER — Emergency Department (HOSPITAL_COMMUNITY)

## 2023-12-31 ENCOUNTER — Telehealth: Payer: Self-pay | Admitting: Cardiology

## 2023-12-31 ENCOUNTER — Observation Stay (HOSPITAL_COMMUNITY)

## 2023-12-31 DIAGNOSIS — N189 Chronic kidney disease, unspecified: Secondary | ICD-10-CM | POA: Diagnosis not present

## 2023-12-31 DIAGNOSIS — K279 Peptic ulcer, site unspecified, unspecified as acute or chronic, without hemorrhage or perforation: Secondary | ICD-10-CM | POA: Diagnosis present

## 2023-12-31 DIAGNOSIS — I2510 Atherosclerotic heart disease of native coronary artery without angina pectoris: Secondary | ICD-10-CM | POA: Diagnosis not present

## 2023-12-31 DIAGNOSIS — G459 Transient cerebral ischemic attack, unspecified: Secondary | ICD-10-CM | POA: Diagnosis not present

## 2023-12-31 DIAGNOSIS — I639 Cerebral infarction, unspecified: Secondary | ICD-10-CM | POA: Diagnosis not present

## 2023-12-31 DIAGNOSIS — I25118 Atherosclerotic heart disease of native coronary artery with other forms of angina pectoris: Secondary | ICD-10-CM

## 2023-12-31 DIAGNOSIS — Z471 Aftercare following joint replacement surgery: Secondary | ICD-10-CM | POA: Diagnosis not present

## 2023-12-31 DIAGNOSIS — R9431 Abnormal electrocardiogram [ECG] [EKG]: Secondary | ICD-10-CM | POA: Diagnosis not present

## 2023-12-31 DIAGNOSIS — R2681 Unsteadiness on feet: Secondary | ICD-10-CM | POA: Insufficient documentation

## 2023-12-31 DIAGNOSIS — E039 Hypothyroidism, unspecified: Secondary | ICD-10-CM | POA: Diagnosis not present

## 2023-12-31 DIAGNOSIS — Z951 Presence of aortocoronary bypass graft: Secondary | ICD-10-CM | POA: Diagnosis not present

## 2023-12-31 DIAGNOSIS — Z952 Presence of prosthetic heart valve: Secondary | ICD-10-CM | POA: Insufficient documentation

## 2023-12-31 DIAGNOSIS — I6523 Occlusion and stenosis of bilateral carotid arteries: Secondary | ICD-10-CM | POA: Diagnosis not present

## 2023-12-31 DIAGNOSIS — K219 Gastro-esophageal reflux disease without esophagitis: Secondary | ICD-10-CM | POA: Diagnosis present

## 2023-12-31 DIAGNOSIS — I6389 Other cerebral infarction: Principal | ICD-10-CM | POA: Insufficient documentation

## 2023-12-31 DIAGNOSIS — Z9889 Other specified postprocedural states: Secondary | ICD-10-CM | POA: Diagnosis present

## 2023-12-31 DIAGNOSIS — E785 Hyperlipidemia, unspecified: Secondary | ICD-10-CM | POA: Diagnosis present

## 2023-12-31 DIAGNOSIS — R29818 Other symptoms and signs involving the nervous system: Secondary | ICD-10-CM | POA: Diagnosis not present

## 2023-12-31 DIAGNOSIS — R531 Weakness: Secondary | ICD-10-CM | POA: Diagnosis present

## 2023-12-31 DIAGNOSIS — G2581 Restless legs syndrome: Secondary | ICD-10-CM | POA: Diagnosis present

## 2023-12-31 DIAGNOSIS — Z7982 Long term (current) use of aspirin: Secondary | ICD-10-CM | POA: Diagnosis not present

## 2023-12-31 DIAGNOSIS — Z79899 Other long term (current) drug therapy: Secondary | ICD-10-CM | POA: Insufficient documentation

## 2023-12-31 DIAGNOSIS — R9082 White matter disease, unspecified: Secondary | ICD-10-CM | POA: Diagnosis not present

## 2023-12-31 DIAGNOSIS — R29705 NIHSS score 5: Secondary | ICD-10-CM

## 2023-12-31 DIAGNOSIS — I959 Hypotension, unspecified: Secondary | ICD-10-CM | POA: Diagnosis not present

## 2023-12-31 DIAGNOSIS — R9089 Other abnormal findings on diagnostic imaging of central nervous system: Secondary | ICD-10-CM | POA: Diagnosis not present

## 2023-12-31 DIAGNOSIS — R2990 Unspecified symptoms and signs involving the nervous system: Principal | ICD-10-CM

## 2023-12-31 DIAGNOSIS — R4781 Slurred speech: Secondary | ICD-10-CM | POA: Diagnosis not present

## 2023-12-31 DIAGNOSIS — R29810 Facial weakness: Secondary | ICD-10-CM | POA: Diagnosis not present

## 2023-12-31 DIAGNOSIS — I63521 Cerebral infarction due to unspecified occlusion or stenosis of right anterior cerebral artery: Secondary | ICD-10-CM | POA: Diagnosis not present

## 2023-12-31 DIAGNOSIS — I672 Cerebral atherosclerosis: Secondary | ICD-10-CM | POA: Diagnosis not present

## 2023-12-31 DIAGNOSIS — E782 Mixed hyperlipidemia: Secondary | ICD-10-CM

## 2023-12-31 LAB — DIFFERENTIAL
Abs Immature Granulocytes: 0.01 K/uL (ref 0.00–0.07)
Basophils Absolute: 0 K/uL (ref 0.0–0.1)
Basophils Relative: 1 %
Eosinophils Absolute: 0.2 K/uL (ref 0.0–0.5)
Eosinophils Relative: 4 %
Immature Granulocytes: 0 %
Lymphocytes Relative: 36 %
Lymphs Abs: 1.4 K/uL (ref 0.7–4.0)
Monocytes Absolute: 0.5 K/uL (ref 0.1–1.0)
Monocytes Relative: 12 %
Neutro Abs: 1.9 K/uL (ref 1.7–7.7)
Neutrophils Relative %: 47 %

## 2023-12-31 LAB — CBC
HCT: 37.9 % — ABNORMAL LOW (ref 39.0–52.0)
Hemoglobin: 12.7 g/dL — ABNORMAL LOW (ref 13.0–17.0)
MCH: 31.4 pg (ref 26.0–34.0)
MCHC: 33.5 g/dL (ref 30.0–36.0)
MCV: 93.6 fL (ref 80.0–100.0)
Platelets: 153 K/uL (ref 150–400)
RBC: 4.05 MIL/uL — ABNORMAL LOW (ref 4.22–5.81)
RDW: 12.8 % (ref 11.5–15.5)
WBC: 4 K/uL (ref 4.0–10.5)
nRBC: 0 % (ref 0.0–0.2)

## 2023-12-31 LAB — COMPREHENSIVE METABOLIC PANEL WITH GFR
ALT: 40 U/L (ref 0–44)
AST: 32 U/L (ref 15–41)
Albumin: 3.6 g/dL (ref 3.5–5.0)
Alkaline Phosphatase: 45 U/L (ref 38–126)
Anion gap: 10 (ref 5–15)
BUN: 20 mg/dL (ref 8–23)
CO2: 23 mmol/L (ref 22–32)
Calcium: 9 mg/dL (ref 8.9–10.3)
Chloride: 106 mmol/L (ref 98–111)
Creatinine, Ser: 1.33 mg/dL — ABNORMAL HIGH (ref 0.61–1.24)
GFR, Estimated: 53 mL/min — ABNORMAL LOW (ref >60)
Glucose, Bld: 99 mg/dL (ref 70–99)
Potassium: 3.9 mmol/L (ref 3.5–5.1)
Sodium: 139 mmol/L (ref 135–145)
Total Bilirubin: 1 mg/dL (ref 0.0–1.2)
Total Protein: 6 g/dL — ABNORMAL LOW (ref 6.5–8.1)

## 2023-12-31 LAB — PROTIME-INR
INR: 1.1 (ref 0.8–1.2)
Prothrombin Time: 14.5 s (ref 11.4–15.2)

## 2023-12-31 LAB — I-STAT CHEM 8, ED
BUN: 23 mg/dL (ref 8–23)
Calcium, Ion: 1.16 mmol/L (ref 1.15–1.40)
Chloride: 105 mmol/L (ref 98–111)
Creatinine, Ser: 1.4 mg/dL — ABNORMAL HIGH (ref 0.61–1.24)
Glucose, Bld: 92 mg/dL (ref 70–99)
HCT: 37 % — ABNORMAL LOW (ref 39.0–52.0)
Hemoglobin: 12.6 g/dL — ABNORMAL LOW (ref 13.0–17.0)
Potassium: 4 mmol/L (ref 3.5–5.1)
Sodium: 141 mmol/L (ref 135–145)
TCO2: 24 mmol/L (ref 22–32)

## 2023-12-31 LAB — APTT: aPTT: 29 s (ref 24–36)

## 2023-12-31 LAB — ETHANOL: Alcohol, Ethyl (B): <15 mg/dL (ref <15)

## 2023-12-31 LAB — CBG MONITORING, ED: Glucose-Capillary: 100 mg/dL — ABNORMAL HIGH (ref 70–99)

## 2023-12-31 MED ORDER — ACETAMINOPHEN 650 MG RE SUPP: 650.0000 mg | RECTAL | Status: DC | PRN

## 2023-12-31 MED ORDER — ACETAMINOPHEN 325 MG PO TABS
650.0000 mg | ORAL_TABLET | ORAL | Status: DC | PRN
Start: 2023-12-31 — End: 2024-01-01

## 2023-12-31 MED ORDER — SODIUM CHLORIDE 0.9% FLUSH
3.0000 mL | Freq: Once | INTRAVENOUS | Status: AC
  Administered 2023-12-31: 3 mL via INTRAVENOUS

## 2023-12-31 MED ORDER — LEVOTHYROXINE SODIUM 25 MCG PO TABS
50.0000 ug | ORAL_TABLET | Freq: Every day | ORAL | Status: DC
  Administered 2024-01-01: 50 ug via ORAL
  Filled 2023-12-31: qty 2

## 2023-12-31 MED ORDER — STROKE: EARLY STAGES OF RECOVERY BOOK
Freq: Once | Status: AC
  Filled 2023-12-31: qty 1

## 2023-12-31 MED ORDER — CLOPIDOGREL BISULFATE 300 MG PO TABS
300.0000 mg | ORAL_TABLET | Freq: Once | ORAL | Status: AC
  Administered 2023-12-31: 300 mg via ORAL
  Filled 2023-12-31: qty 1

## 2023-12-31 MED ORDER — TRAZODONE HCL 50 MG PO TABS
50.0000 mg | ORAL_TABLET | Freq: Every day | ORAL | Status: DC
Start: 2023-12-31 — End: 2024-01-01
  Administered 2023-12-31: 50 mg via ORAL
  Filled 2023-12-31: qty 1

## 2023-12-31 MED ORDER — ATORVASTATIN CALCIUM 40 MG PO TABS
20.0000 mg | ORAL_TABLET | Freq: Every day | ORAL | Status: DC
Start: 2023-12-31 — End: 2024-01-01
  Administered 2023-12-31: 20 mg via ORAL
  Filled 2023-12-31: qty 1

## 2023-12-31 MED ORDER — FAMOTIDINE 20 MG PO TABS: 20.0000 mg | ORAL_TABLET | Freq: Every day | ORAL | Status: DC | PRN

## 2023-12-31 MED ORDER — ASPIRIN 81 MG PO TBEC
81.0000 mg | DELAYED_RELEASE_TABLET | Freq: Every day | ORAL | Status: DC
Start: 2023-12-31 — End: 2023-12-31
  Administered 2023-12-31: 81 mg via ORAL
  Filled 2023-12-31: qty 1

## 2023-12-31 MED ORDER — ACETAMINOPHEN 160 MG/5ML PO SOLN: 650.0000 mg | ORAL | Status: DC | PRN

## 2023-12-31 MED ORDER — CLONAZEPAM 1 MG PO TABS
1.0000 mg | ORAL_TABLET | Freq: Every evening | ORAL | Status: DC | PRN
  Administered 2023-12-31: 1 mg via ORAL
  Filled 2023-12-31: qty 1

## 2023-12-31 MED ORDER — SENNOSIDES-DOCUSATE SODIUM 8.6-50 MG PO TABS: 1.0000 | ORAL_TABLET | Freq: Every evening | ORAL | Status: DC | PRN

## 2023-12-31 MED ORDER — CLOPIDOGREL BISULFATE 75 MG PO TABS
75.0000 mg | ORAL_TABLET | Freq: Every day | ORAL | Status: DC
  Administered 2024-01-01: 75 mg via ORAL
  Filled 2023-12-31: qty 1

## 2023-12-31 MED ORDER — ASPIRIN 81 MG PO TBEC
81.0000 mg | DELAYED_RELEASE_TABLET | Freq: Every day | ORAL | Status: DC
  Administered 2024-01-01: 81 mg via ORAL
  Filled 2023-12-31: qty 1

## 2023-12-31 MED ORDER — IOHEXOL 350 MG/ML SOLN
100.0000 mL | Freq: Once | INTRAVENOUS | Status: AC | PRN
  Administered 2023-12-31: 100 mL via INTRAVENOUS

## 2023-12-31 NOTE — ED Provider Notes (Signed)
  Physical Exam  BP 114/68 (BP Location: Right Arm)   Pulse 66   Resp 16   Ht 6' (1.829 m)   Wt 91 kg   SpO2 96%   BMI 27.21 kg/m   Physical Exam  Procedures  Procedures  ED Course / MDM   Clinical Course as of 12/31/23 1543  Tue Dec 31, 2023  1350 Met on arrival.  Last known normal around midnight last night.  Maintaining Airway. Some facial droop and aphasia noted by EMS.  Not on blood thinner. [TY]  1436 CT ANGIO HEAD NECK W WO CM W PERF (CODE STROKE) IMPRESSION: 1. Moderate calcific plaque within the carotid bulbs bilaterally, with approximately 50% stenosis on the right and less than 50% stenosis on the left. 2. Moderate calcific atheromatous disease within the cavernous and supraclinoid segments of the internal carotid arteries bilaterally, with estimated stenosis 50%. 3. No evidence of large vessel occlusion. 4. Normal CT perfusion of the head.  These results were called by telephone at the time of interpretation on 12/31/2023 at 2:18 pm to provider Dr. CAMELLIA SHARK , who verbally acknowledged these results.   Electronically Signed   By: Evalene Coho M.D.   On: 12/31/2023 14:26   [TY]  1440 Per neurology note, admit for stroke workup.  Consult placed for admission.  Lab workup reviewed no significant metabolic derangements that would explain patient's presentation today.  Minor elevation in his creatinine compared to prior, but not AKI.  No fever or tachycardia or leukocytosis to suggest systemic infection.  Minor anemia.  EKG appears to be normal sinus rhythm without arrhythmia or ST segment changes to an acute ischemia.  Will admit for stroke workup [TY]    Clinical Course User Index [TY] Neysa Caron PARAS, DO   Medical Decision Making Amount and/or Complexity of Data Reviewed Labs: ordered. Radiology: ordered. Decision-making details documented in ED Course. ECG/medicine tests: ordered.  Risk Prescription drug management. Decision regarding  hospitalization.   19M presenting as a code stroke, outside the window for TNK, waiting on callback for admit. Dr. Melvin accepted the patient in admission.        Jerrol Agent, MD 12/31/23 [phone]

## 2023-12-31 NOTE — Code Documentation (Signed)
 Stroke Response Nurse Documentation Code Documentation  Jordan Hammond is a 83 y.o. male arriving to [facility]  via [facility] on 12/31/2023 with past medical hx of CAD CKD GERD. On aspirin  81 mg daily. Code stroke was activated by EMS.   Patient from home where he was LKW at 0800 and now complaining of left droop, left leg weakness and dysarthria.   Stroke team at the bedside on patient arrival. Labs drawn and patient cleared for CT by EDP. Patient to CT with team. NIHSS 4, see documentation for details and code stroke times. Patient with left facial droop, left leg weakness, left sensory loss, dysarthria and mild aphasia on exam. The following imaging was completed:  CT Head, CTA, and CTP. Patient is not a candidate for IV Thrombolytic due to LKW is outside of treatment window. Patient is not a candidate for IR due to LVO negative on advanced imaging..   Care Plan: q 2 NIHSS and VS for 12 hrs, then q 4. Please obtain a stroke swallow screen before any po's given..     Bedside handoff with ED RN Marcelline.    Brevyn Ring Livengood  Stroke Response RN

## 2023-12-31 NOTE — ED Provider Notes (Signed)
 [place] EMERGENCY DEPARTMENT AT [facility] Provider Note   CSN: [id number] Arrival date & time: 12/31/23  1346  An emergency department physician performed an initial assessment on this suspected stroke patient at 1348.  Patient presents with: Code Stroke   Jordan Hammond is a 83 y.o. male.   83 year old male presenting emergency department as a code stroke.  Facial droop this morning 8 AM with some word finding difficulty left-sided weakness.        Prior to Admission medications   Medication Sig Start Date End Date Taking? Authorizing Provider  acetaminophen  (TYLENOL ) 325 MG tablet Take 650 mg by mouth in the morning.    [provider]  aspirin  EC 81 MG EC tablet Take 1 tablet (81 mg total) by mouth daily. 02/04/17   Dwan Kyla HERO, PA-C  atorvastatin  (LIPITOR) 20 MG tablet Take 1 tablet (20 mg total) by mouth daily. Patient taking differently: Take 20 mg by mouth at bedtime. 12/28/19   Tobb, Kardie, DO  calcium  carbonate (CALCIUM  600) 600 MG TABS tablet Take 600 mg by mouth daily. With D3    [provider]  Cholecalciferol (VITAMIN D3) 5000 units CAPS Take 5,000 Units by mouth daily.    [provider]  clonazePAM  (KLONOPIN ) 1 MG tablet Take 1 mg by mouth at bedtime.    [provider]  Coenzyme Q10 (CO Q 10 PO) Take 300 mg by mouth daily.    [provider]  Cyanocobalamin  (VITAMIN B-12) 5000 MCG TBDP Take 5,000 mcg by mouth in the morning.    [provider]  diphenhydrAMINE (BENADRYL) 50 MG tablet Take 50 mg by mouth at bedtime. Sleepaid    [provider]  docusate sodium  (COLACE) 100 MG capsule Take 100 mg by mouth 2 (two) times daily.    [provider]  famotidine  (PEPCID ) 20 MG tablet Take 20 mg by mouth daily as needed for heartburn or indigestion.    [provider]  Flaxseed Oil OIL Take 1,000 mg by mouth in the morning.    [provider]  Homeopathic  Products (LEG CRAMPS) TABS Take 1 tablet by mouth at bedtime. Magnesium     [provider]  levothyroxine  (SYNTHROID ) 50 MCG tablet Take 50 mcg by mouth daily before breakfast.    [provider]  nitroGLYCERIN  (NITROSTAT ) 0.4 MG SL tablet Place 1 tablet (0.4 mg total) under the tongue every 5 (five) minutes as needed for chest pain. Do not take nitroglycerine within 48 hours of taking viagra 10/30/23   Meng, Hao, PA  OVER THE COUNTER MEDICATION Place 2 sprays into both nostrils as needed (Allergy). [facility] allergy relief    [provider]  sildenafil (VIAGRA) 100 MG tablet Take 100 mg by mouth as needed for erectile dysfunction.    [provider]  Simethicone  (GAS FREE EXTRA STRENGTH PO) Take 125 mg by mouth daily as needed (gas).    [provider]  traZODone  (DESYREL ) 50 MG tablet Take 50 mg by mouth at bedtime.    [provider]    Allergies: Pravastatin, Aspirin , Atorvastatin , Erythromycin, Fentanyl , Ibuprofen, Nabumetone, Nsaids, Sulfa antibiotics, Tetracycline, Elemental sulfur, and Sulfamethoxazole    Review of Systems  Updated Vital Signs BP 114/68 (BP Location: Right Arm)   Pulse 66   Resp 16   Ht 6' (1.829 m)   Wt 91 kg   SpO2 96%   BMI 27.21 kg/m   Physical Exam Vitals and nursing note reviewed.  Constitutional:      General: He is not in acute distress.    Appearance: He is not toxic-appearing.  HENT:     Nose: Nose normal.  Eyes:     Conjunctiva/sclera: Conjunctivae normal.  Cardiovascular:     Rate and Rhythm: Normal rate.  Pulmonary:     Effort: Pulmonary effort is normal.  Abdominal:     General: Abdomen is flat. There is no distension.     Tenderness: There is no abdominal tenderness. There is no guarding.  Skin:    General: Skin is warm and dry.     Capillary Refill: Capillary refill takes less than 2 seconds.  Neurological:     Mental Status: He is alert and oriented to person, place, and time.      Comments: Left-sided facial droop.  Some minor aphasia/word finding difficulty.  No dysarthria. able to move all extremities, but notes that left upper extremity feels heavier and weaker  Psychiatric:        Mood and Affect: Mood normal.        Behavior: Behavior normal.     (all labs ordered are listed, but only abnormal results are displayed) Labs Reviewed  CBC - Abnormal; Notable for the following components:      Result Value   RBC 4.05 (*)    Hemoglobin 12.7 (*)    HCT 37.9 (*)    All other components within normal limits  COMPREHENSIVE METABOLIC PANEL WITH GFR - Abnormal; Notable for the following components:   Creatinine, Ser 1.33 (*)    Total Protein 6.0 (*)    GFR, Estimated 53 (*)    All other components within normal limits  I-STAT CHEM 8, ED - Abnormal; Notable for the following components:   Creatinine, Ser 1.40 (*)    Hemoglobin 12.6 (*)    HCT 37.0 (*)    All other components within normal limits  CBG MONITORING, ED - Abnormal; Notable for the following components:   Glucose-Capillary 100 (*)    All other components within normal limits  PROTIME-INR  APTT  DIFFERENTIAL  ETHANOL    EKG: None  Radiology: CT ANGIO HEAD NECK W WO CM W PERF (CODE STROKE) Result Date: 12/31/2023 CLINICAL DATA:  Neuro deficit, acute, stroke suspected EXAM: CT ANGIOGRAPHY OF THE HEAD AND NECK CT PERFUSION BRAIN TECHNIQUE: Multidetector CT imaging of the head and neck was performed using the standard protocol during bolus administration of intravenous contrast. Multiplanar CT image reconstructions and MIPs were obtained to evaluate the vascular anatomy. Carotid stenosis measurements (when applicable) are obtained utilizing NASCET criteria, using the distal internal carotid diameter as the denominator. Multiphase CT imaging of the brain was performed following IV bolus contrast injection. Subsequent parametric perfusion maps were calculated using RAPID software. RADIATION DOSE REDUCTION:  This exam was performed according to the departmental dose-optimization program which includes automated exposure control, adjustment of the mA and/or kV according to patient size and/or use of iterative reconstruction technique. CONTRAST:  OMNIPAQUE  IOHEXOL  350 MG/ML SOLN COMPARISON:  CT the head dated December 31, 2023. FINDINGS: CTA NECK Aortic arch: Normal caliber. Mild calcific atheromatous disease. Standard three-vessel origin of the great arteries. The brachiocephalic artery is widely patent. Right carotid system: There is moderate calcific plaque within the right carotid bulb and origin of the internal carotid artery, with approximately 50% luminal stenosis. The remainder of the cervical segment of the internal carotid arteries normal in caliber. Left carotid system: There is moderate calcific plaque within  the left carotid bulb with less than 50% luminal stenosis. The remainder of the cervical segment of the internal carotid arteries normal in caliber. Vertebral arteries:The vertebral arteries are codominant and normal in caliber throughout the respective courses. Skeleton: Negative. Other neck: Negative. CTA HEAD Anterior circulation: There is mild to moderate calcific atheromatous disease within the carotid siphons and supraclinoid segments of the internal carotid arteries bilaterally, with moderate stenosis, approximately 50% bilaterally. The anterior and middle cerebral arteries and their branches appear normal in caliber. There is no evidence of large vessel occlusion, flow-limiting stenosis or aneurysm. Posterior circulation: The vertebrobasilar system is unremarkable. The posterior cerebral arteries and the cerebellar arteries are normal in caliber. Venous sinuses: Patent. Anatomic variants: None. Delayed phase: Unremarkable. CT Brain Perfusion Findings: ASPECTS: 10. CBF (<30%) Volume: 0mL Perfusion (Tmax>6.0s) volume: 0mL Mismatch Volume: 0mL Infarction Location:Not applicable. IMPRESSION: 1.  Moderate calcific plaque within the carotid bulbs bilaterally, with approximately 50% stenosis on the right and less than 50% stenosis on the left. 2. Moderate calcific atheromatous disease within the cavernous and supraclinoid segments of the internal carotid arteries bilaterally, with estimated stenosis 50%. 3. No evidence of large vessel occlusion. 4. Normal CT perfusion of the head. These results were called by telephone at the time of interpretation on 12/31/2023 at 2:18 pm to provider Dr. CAMELLIA SHARK , who verbally acknowledged these results. Electronically Signed   By: Evalene Coho M.D.   On: 12/31/2023 14:26   CT HEAD CODE STROKE WO CONTRAST Result Date: 12/31/2023 CLINICAL DATA:  Code stroke.  Left facial droop. EXAM: CT HEAD WITHOUT CONTRAST TECHNIQUE: Contiguous axial images were obtained from the base of the skull through the vertex without intravenous contrast. RADIATION DOSE REDUCTION: This exam was performed according to the departmental dose-optimization program which includes automated exposure control, adjustment of the mA and/or kV according to patient size and/or use of iterative reconstruction technique. COMPARISON:  None Available. FINDINGS: Brain: Age-related atrophy. Mild cerebral white matter disease. No evidence of hemorrhage, mass, cortical infarct or hydrocephalus. Vascular: Negative. Skull: Intact and unremarkable. Sinuses/Orbits: Mild mucosal disease within the ethmoid air cells. Status post bilateral lens replacement. Other: None. IMPRESSION: 1. Mild small vessel disease. 2. ASPECTS is 10. 3. These results were communicated to Dr. Lindzen at 1:58 pm on 12/31/2023 by text page via the [facility] messaging system. Electronically Signed   By: Evalene Coho M.D.   On: 12/31/2023 14:00     Procedures   Medications Ordered in the ED  aspirin  EC tablet 81 mg (has no administration in time range)  clopidogrel  (PLAVIX ) tablet 300 mg (has no administration in time range)   clopidogrel  (PLAVIX ) tablet 75 mg (has no administration in time range)   stroke: early stages of recovery book (has no administration in time range)  sodium chloride  flush (NS) 0.9 % injection 3 mL (3 mLs Intravenous Given 12/31/23 1413)  iohexol  (OMNIPAQUE ) 350 MG/ML injection 100 mL (100 mLs Intravenous Contrast Given 12/31/23 1402)    Clinical Course as of 12/31/23 1442  Tue Dec 31, 2023  1350 Met on arrival.  Last known normal around midnight last night.  Maintaining Airway. Some facial droop and aphasia noted by EMS.  Not on blood thinner. [TY]  1436 CT ANGIO HEAD NECK W WO CM W PERF (CODE STROKE) IMPRESSION: 1. Moderate calcific plaque within the carotid bulbs bilaterally, with approximately 50% stenosis on the right and less than 50% stenosis on the left. 2. Moderate calcific atheromatous disease within the cavernous and  supraclinoid segments of the internal carotid arteries bilaterally, with estimated stenosis 50%. 3. No evidence of large vessel occlusion. 4. Normal CT perfusion of the head.  These results were called by telephone at the time of interpretation on 12/31/2023 at 2:18 pm to provider Dr. CAMELLIA SHARK , who verbally acknowledged these results.   Electronically Signed   By: Evalene Coho M.D.   On: 12/31/2023 14:26   [TY]  1440 Per neurology note, admit for stroke workup.  Consult placed for admission.  Lab workup reviewed no significant metabolic derangements that would explain patient's presentation today.  Minor elevation in his creatinine compared to prior, but not AKI.  No fever or tachycardia or leukocytosis to suggest systemic infection.  Minor anemia.  EKG appears to be normal sinus rhythm without arrhythmia or ST segment changes to an acute ischemia.  Will admit for stroke workup [TY]    Clinical Course User Index [TY] Neysa Caron PARAS, DO                                 Medical Decision Making Amount and/or Complexity of Data Reviewed Independent  Historian: EMS    Details: Provided history, last known normal when went to bed at midnight External Data Reviewed:     Details: Not on blood thinner Labs: ordered. Decision-making details documented in ED Course. Radiology: ordered and independent interpretation performed. Decision-making details documented in ED Course.    Details: Do not appreciate obvious intracranial hemorrhage on CT head ECG/medicine tests: ordered and independent interpretation performed.    Details: Sinus rhythm.  No STEMI.  No arrhythmia Discussion of management or test interpretation with external provider(s): Nuerology and hospitalist.   Risk Prescription drug management. Decision regarding hospitalization. Diagnosis or treatment significantly limited by social determinants of health. Risk Details: Poor health literacy       Final diagnoses:  None    ED Discharge Orders     None          Neysa Caron PARAS, DO 12/31/23 1442

## 2023-12-31 NOTE — ED Triage Notes (Signed)
 Pt to the ed from home via ems with a CC of L sided weakness with facial droop starting at 8 am. Pt denies blood thinners, recent trauma to had, or dizziness. Pt relays takes Asprin daily.  Pt alert and oriented.

## 2023-12-31 NOTE — Telephone Encounter (Signed)
 Pt wife called in stating that pt was having stroke like symptoms and he is currently on the way to [place] [facility] with EMS. She wanted to let Dr. Pietro know.

## 2023-12-31 NOTE — Consult Note (Addendum)
 NEUROLOGY CONSULT NOTE   Date of service: December 31, 2023 Patient Name: Jordan Hammond MRN:  [id number] DOB:  07/09/1940 Chief Complaint: Left-sided weakness, dysarthria and left facial droop Requesting Provider: Neysa Caron PARAS, DO  History of Present Illness  Jordan Hammond is a 83 y.o. male with hx of CAD status post CABG, aortic stenosis status post bioprosthetic aortic valve replacement and hyperlipidemia who presents with sudden onset left facial droop, dysarthria and left-sided weakness.  Patient noticed that he looked normal when he looked in the mirror at 8:00 in the morning and subsequently developed left facial droop.  He then tried to make breakfast and had difficulty moving his left arm.  He states that he notices his voice is different than usual and is having some trouble with word finding.  He reports some recent shortness of breath but no other symptoms.  LKW: 0800 Modified rankin score: 0-Completely asymptomatic and back to baseline post- stroke IV Thrombolysis: No, outside of window EVT: No, no LVO  NIHSS components Score: Comment  1a Level of Conscious 0[x]  1[]  2[]  3[]      1b LOC Questions 0[x]  1[]  2[]       1c LOC Commands 0[x]  1[]  2[]       2 Best Gaze 0[x]  1[]  2[]       3 Visual 0[x]  1[]  2[]  3[]      4 Facial Palsy 0[]  1[x]  2[]  3[]      5a Motor Arm - left 0[x]  1[]  2[]  3[]  4[]  UN[]    5b Motor Arm - Right 0[x]  1[]  2[]  3[]  4[]  UN[]    6a Motor Leg - Left 0[]  1[x]  2[]  3[]  4[]  UN[]    6b Motor Leg - Right 0[x]  1[]  2[]  3[]  4[]  UN[]    7 Limb Ataxia 0[x]  1[]  2[]  UN[]      8 Sensory 0[]  1[x]  2[]  UN[]      9 Best Language 0[]  1[x]  2[]  3[]      10 Dysarthria 0[]  1[x]  2[]  UN[]      11 Extinct. and Inattention 0[x]  1[]  2[]       TOTAL:5       ROS  Comprehensive ROS performed and pertinent positives documented in HPI   Past History   Past Medical History:  Diagnosis Date   Aortic atherosclerosis (HCC) 01/20/2017   Aortic stenosis 01/20/2017   CAD (coronary artery disease),  native coronary artery 01/20/2017   GERD (gastroesophageal reflux disease)    Hiatal hernia    Hyperlipidemia    Kidney stones    Restless leg syndrome     Past Surgical History:  Procedure Laterality Date   AORTIC VALVE REPLACEMENT N/A 01/29/2017   Procedure: AORTIC VALVE REPLACEMENT (AVR);  Surgeon: Fleeta Ochoa, Maude, MD;  Location: [facility] OR;  Service: Open Heart Surgery;  Laterality: N/A;   APPENDECTOMY     bowel obstruction surgery     CHOLECYSTECTOMY     CORONARY ARTERY BYPASS GRAFT N/A 01/29/2017   Procedure: CORONARY ARTERY BYPASS GRAFTING (CABG)  x two, using left internal mammary artery and right leg greater saphenous vein harvested endoscopically;  Surgeon: Fleeta Ochoa Maude, MD;  Location: [facility] OR;  Service: Open Heart Surgery;  Laterality: N/A;   KNEE ARTHROSCOPY     lap nissan     LEFT HEART CATH AND CORONARY ANGIOGRAPHY N/A 01/21/2017   Procedure: LEFT HEART CATH AND CORONARY ANGIOGRAPHY;  Surgeon: Court Dorn PARAS, MD;  Location: [facility] INVASIVE CV LAB;  Service: Cardiovascular;  Laterality: N/A;   LEFT HEART CATH AND CORS/GRAFTS ANGIOGRAPHY N/A 11/06/2023  Procedure: LEFT HEART CATH AND CORS/GRAFTS ANGIOGRAPHY;  Surgeon: [place], Peter M, MD;  Location: [facility] INVASIVE CV LAB;  Service: Cardiovascular;  Laterality: N/A;   RIGHT HEART CATH N/A 01/21/2017   Procedure: RIGHT HEART CATH;  Surgeon: Court Dorn PARAS, MD;  Location: [facility] INVASIVE CV LAB;  Service: Cardiovascular;  Laterality: N/A;   SHOULDER SURGERY     TEE WITHOUT CARDIOVERSION N/A 01/29/2017   Procedure: TRANSESOPHAGEAL ECHOCARDIOGRAM (TEE);  Surgeon: Fleeta Ochoa, Maude, MD;  Location: [facility] OR;  Service: Open Heart Surgery;  Laterality: N/A;    Family History: Family History  Problem Relation Age of Onset   CAD Father        s/p triple bypass   Dementia Mother     Social History  reports that he has never smoked. He has never used smokeless tobacco. He reports that he does not drink alcohol and does not use  drugs.  Allergies  Allergen Reactions   Pravastatin Nausea And Vomiting   Aspirin  Nausea And Vomiting    Very heavy doses   Atorvastatin  Other (See Comments)   Erythromycin Nausea And Vomiting    Unsure if there are other reactions   Fentanyl     Ibuprofen Nausea And Vomiting    Very heavy doses   Nabumetone Nausea And Vomiting   Nsaids Nausea And Vomiting    Very heavy doses   Sulfa Antibiotics    Tetracycline Nausea And Vomiting    Unsure if there are other reactions   Elemental Sulfur Itching, Rash and Swelling   Sulfamethoxazole Itching, Rash and Swelling    Medications   Current Facility-Administered Medications:    sodium chloride  flush (NS) 0.9 % injection 3 mL, 3 mL, Intravenous, Once, Young, Travis J, DO  Current Outpatient Medications:    acetaminophen  (TYLENOL ) 325 MG tablet, Take 650 mg by mouth in the morning., Disp: , Rfl:    aspirin  EC 81 MG EC tablet, Take 1 tablet (81 mg total) by mouth daily., Disp: , Rfl:    atorvastatin  (LIPITOR) 20 MG tablet, Take 1 tablet (20 mg total) by mouth daily. (Patient taking differently: Take 20 mg by mouth at bedtime.), Disp: 90 tablet, Rfl: 0   calcium  carbonate (CALCIUM  600) 600 MG TABS tablet, Take 600 mg by mouth daily. With D3, Disp: , Rfl:    Cholecalciferol (VITAMIN D3) 5000 units CAPS, Take 5,000 Units by mouth daily., Disp: , Rfl:    clonazePAM  (KLONOPIN ) 1 MG tablet, Take 1 mg by mouth at bedtime., Disp: , Rfl:    Coenzyme Q10 (CO Q 10 PO), Take 300 mg by mouth daily., Disp: , Rfl:    Cyanocobalamin  (VITAMIN B-12) 5000 MCG TBDP, Take 5,000 mcg by mouth in the morning., Disp: , Rfl:    diphenhydrAMINE (BENADRYL) 50 MG tablet, Take 50 mg by mouth at bedtime. Sleepaid, Disp: , Rfl:    docusate sodium  (COLACE) 100 MG capsule, Take 100 mg by mouth 2 (two) times daily., Disp: , Rfl:    famotidine  (PEPCID ) 20 MG tablet, Take 20 mg by mouth daily as needed for heartburn or indigestion., Disp: , Rfl:    Flaxseed Oil OIL, Take  1,000 mg by mouth in the morning., Disp: , Rfl:    Homeopathic Products (LEG CRAMPS) TABS, Take 1 tablet by mouth at bedtime. Magnesium , Disp: , Rfl:    levothyroxine  (SYNTHROID ) 50 MCG tablet, Take 50 mcg by mouth daily before breakfast., Disp: , Rfl:    nitroGLYCERIN  (NITROSTAT ) 0.4 MG SL tablet, Place 1  tablet (0.4 mg total) under the tongue every 5 (five) minutes as needed for chest pain. Do not take nitroglycerine within 48 hours of taking viagra, Disp: 25 tablet, Rfl: 3   OVER THE COUNTER MEDICATION, Place 2 sprays into both nostrils as needed (Allergy). [facility] allergy relief, Disp: , Rfl:    sildenafil (VIAGRA) 100 MG tablet, Take 100 mg by mouth as needed for erectile dysfunction., Disp: , Rfl:    Simethicone  (GAS FREE EXTRA STRENGTH PO), Take 125 mg by mouth daily as needed (gas)., Disp: , Rfl:    traZODone  (DESYREL ) 50 MG tablet, Take 50 mg by mouth at bedtime., Disp: , Rfl:   Vitals   Vitals:   12/31/23 1352  Weight: 91.8 kg    Body mass index is 27.45 kg/m.   Physical Exam   Constitutional: Appears well-developed and well-nourished.  Psych: Affect appropriate to situation.  Eyes: No scleral injection.  HENT: No OP obstruction.  Head: Normocephalic.  Cardiovascular: Normal rate and regular rhythm.  Respiratory: Effort normal, non-labored breathing.  Skin: WDI.   Neurologic Examination    NEURO:  Mental Status: AA&Ox3, able to give clear and coherent history of present illness Speech/Language: speech is with mild dysarthria and mild word finding difficulties, able to name most but not all objects, able to repeat phrases  Cranial Nerves:  II: PERRL. Visual fields full.  III, IV, VI: EOMI. Eyelids elevate symmetrically.  V: Sensation is intact to light touch and symmetrical to face.  VII: Left-sided facial droop VIII: hearing intact to voice. IX, X: Voice is slightly dysarthric XII: tongue is midline without fasciculations. Motor: Able to move all 4 extremities  with good antigravity strength but states left arm feels heavy and uncoordinated and motion is slower on the left than on the right.  Lifts both lower extremities with antigravity strength with some drift present in the left lower extremity Tone: is normal and bulk is normal Sensation- Intact to light touch bilaterally.   Coordination: FTN intact bilaterally Gait- deferred   Labs/Imaging/Neurodiagnostic studies   CBC: No results for input(s): WBC, NEUTROABS, HGB, HCT, MCV, PLT in the last 168 hours. Basic Metabolic Panel:  Lab Results  Component Value Date   NA 140 10/31/2023   K 4.1 10/31/2023   CO2 22 10/31/2023   GLUCOSE 105 (H) 10/31/2023   BUN 22 10/31/2023   CREATININE 1.23 10/31/2023   CALCIUM  9.7 10/31/2023   GFRNONAA 55 (L) 04/25/2022   GFRAA >60 02/03/2017   Lipid Panel:  Lab Results  Component Value Date   LDLCALC 103 (H) 01/20/2017   HgbA1c:  Lab Results  Component Value Date   HGBA1C 5.3 01/24/2017     ASSESSMENT  Seab P Pelzer is a 83 y.o. male with history of CAD status post CABG, aortic stenosis status post aortic valve replacement and hyperlipidemia who presents with sudden onset left facial droop, dysarthria and left-sided weakness.  He is outside the window for TNK.  Initial head CT reveals no acute abnormality, and CTA of the head and neck reveals no LVO.  Will perform brain MRI and admit for stroke workup.  Will load with Plavix  300 mg and start on Plavix  75 mg daily as patient is already on low-dose aspirin  daily - Exam reveals findings best referable to the right cerebral hemisphere.  - CT head: No acute abnormality, mild chronic microvascular ischemic disease - CTA of head and neck: No LVO, moderate calcific plaque within carotid bulbs bilaterally with 50% stenosis on  the right and less than 50% stenosis on the left, moderate calcific atheromatous disease within the cavernous and supraclinoid segments of bilateral internal carotid arteries  with estimated stenosis of 50% - CTP: No core or penumbra infarct seen - Impression: Stroke versus TIA, localizable to the right cerebral hemisphere based on symptoms and signs.   RECOMMENDATIONS  Stroke/TIA Workup: - Admit for stroke workup - Permissive HTN x48 hrs from sx onset or until stroke ruled out by MRI goal BP <220/110. PRN labetalol or hydralazine  if BP above these parameters. Avoid oral antihypertensives. - MRI brain wo contrast - TTE performed within the last 3 months - Check A1c and LDL + add statin per guidelines - Load with 300 mg of Plavix  then begin Plavix  75 mg a day and aspirin  81 mg a day antiplt/anticoag - q4 hr neuro checks - STAT head CT for any change in neuro exam - PT/OT/SLP - Stroke education - Continue his home low-dose atorvastatin  regimen, but consider stopping if he has any adverse effects (has listed allergy) - Cardiac telemetry - Amb referral to neurology upon discharge   ______________________________________________________________________  Patient seen by NP with MD, MD to edit note as needed. Cortney E Everitt Clint Kill , MSN, AGACNP-BC [facility] See Amion for schedule and pager information 12/31/2023 2:30 PM    Signed, MERRIANNE Anila Bojarski, MD [facility]

## 2023-12-31 NOTE — Plan of Care (Signed)
  Problem: Education: Goal: Knowledge of disease or condition will improve Outcome: Progressing   Problem: Coping: Goal: Will verbalize positive feelings about self Outcome: Progressing   Problem: Coping: Goal: Will identify appropriate support needs Outcome: Progressing

## 2023-12-31 NOTE — H&P (Signed)
 History and Physical   DARRIAN GRZELAK FMW:[id number] DOB: May 26, 1941 DOA: 12/31/2023  PCP: Mercer Garnette Barter, FNP   Patient coming from: Home  Chief Complaint: Focal neurologic deficits  HPI: Jordan Hammond is a 83 y.o. male with medical history significant of hyperlipidemia, hypothyroidism, CKD, CAD status post CABG, GERD status post Nissen fundoplication, aortic stenosis status post aortic valve replacement, RLS presenting as a code stroke.  Around 8 AM today patient reportedly had onset of facial droop, left-sided weakness, word finding difficulty.  Symptoms persist in the ED.  Patient denies fevers, chills, chest pain, shortness of breath, abdominal pain, constipation, diarrhea, nausea, vomiting.  ED Course: Vital signs in the ED stable.  Lab workup included CMP with creatinine stable at 1.32, protein 6.0.  CBC with hemoglobin 12.7 near baseline of 14.  PT, PTT, INR within normal limits.  Ethanol level negative.  Lipid panel and A1c pending.  CT head showed no acute dramality.  CTA head and neck with perfusion study showed moderate carotid plaque bilaterally as well as moderate internal carotid artery disease.  No evidence of LVO and normal perfusion component.  Patient received ASA, Plavix  in the ED.  Neurology saw patient on arrival and is getting stroke workup.  Review of Systems: As per HPI otherwise all other systems reviewed and are negative.  Past Medical History:  Diagnosis Date   Aortic atherosclerosis (HCC) 01/20/2017   Aortic stenosis 01/20/2017   CAD (coronary artery disease), native coronary artery 01/20/2017   GERD (gastroesophageal reflux disease)    Hiatal hernia    Hyperlipidemia    Kidney stones    Restless leg syndrome     Past Surgical History:  Procedure Laterality Date   AORTIC VALVE REPLACEMENT N/A 01/29/2017   Procedure: AORTIC VALVE REPLACEMENT (AVR);  Surgeon: Fleeta Ochoa, Maude, MD;  Location: [facility] OR;  Service: Open Heart Surgery;  Laterality: N/A;    APPENDECTOMY     bowel obstruction surgery     CHOLECYSTECTOMY     CORONARY ARTERY BYPASS GRAFT N/A 01/29/2017   Procedure: CORONARY ARTERY BYPASS GRAFTING (CABG)  x two, using left internal mammary artery and right leg greater saphenous vein harvested endoscopically;  Surgeon: Fleeta Ochoa Maude, MD;  Location: [facility] OR;  Service: Open Heart Surgery;  Laterality: N/A;   KNEE ARTHROSCOPY     lap nissan     LEFT HEART CATH AND CORONARY ANGIOGRAPHY N/A 01/21/2017   Procedure: LEFT HEART CATH AND CORONARY ANGIOGRAPHY;  Surgeon: Court Dorn PARAS, MD;  Location: [facility] INVASIVE CV LAB;  Service: Cardiovascular;  Laterality: N/A;   LEFT HEART CATH AND CORS/GRAFTS ANGIOGRAPHY N/A 11/06/2023   Procedure: LEFT HEART CATH AND CORS/GRAFTS ANGIOGRAPHY;  Surgeon: [place], Peter M, MD;  Location: [facility] INVASIVE CV LAB;  Service: Cardiovascular;  Laterality: N/A;   RIGHT HEART CATH N/A 01/21/2017   Procedure: RIGHT HEART CATH;  Surgeon: Court Dorn PARAS, MD;  Location: [facility] INVASIVE CV LAB;  Service: Cardiovascular;  Laterality: N/A;   SHOULDER SURGERY     TEE WITHOUT CARDIOVERSION N/A 01/29/2017   Procedure: TRANSESOPHAGEAL ECHOCARDIOGRAM (TEE);  Surgeon: Fleeta Ochoa, Maude, MD;  Location: [facility] OR;  Service: Open Heart Surgery;  Laterality: N/A;    Social History  reports that he has never smoked. He has never used smokeless tobacco. He reports that he does not drink alcohol and does not use drugs.  Allergies  Allergen Reactions   Pravastatin Nausea And Vomiting   Aspirin  Nausea And Vomiting    Very heavy  doses   Atorvastatin  Other (See Comments)   Erythromycin Nausea And Vomiting    Unsure if there are other reactions   Fentanyl     Ibuprofen Nausea And Vomiting    Very heavy doses   Nabumetone Nausea And Vomiting   Nsaids Nausea And Vomiting    Very heavy doses   Sulfa Antibiotics    Tetracycline Nausea And Vomiting    Unsure if there are other reactions   Elemental Sulfur Itching, Rash and Swelling    Sulfamethoxazole Itching, Rash and Swelling    Family History  Problem Relation Age of Onset   CAD Father        s/p triple bypass   Dementia Mother   Reviewed on admission  Prior to Admission medications   Medication Sig Start Date End Date Taking? Authorizing Provider  acetaminophen  (TYLENOL ) 325 MG tablet Take 650 mg by mouth in the morning.    [provider]  aspirin  EC 81 MG EC tablet Take 1 tablet (81 mg total) by mouth daily. 02/04/17   Dwan Kyla HERO, PA-C  atorvastatin  (LIPITOR) 20 MG tablet Take 1 tablet (20 mg total) by mouth daily. Patient taking differently: Take 20 mg by mouth at bedtime. 12/28/19   Tobb, Kardie, DO  calcium  carbonate (CALCIUM  600) 600 MG TABS tablet Take 600 mg by mouth daily. With D3    [provider]  Cholecalciferol (VITAMIN D3) 5000 units CAPS Take 5,000 Units by mouth daily.    [provider]  clonazePAM  (KLONOPIN ) 1 MG tablet Take 1 mg by mouth at bedtime.    [provider]  Coenzyme Q10 (CO Q 10 PO) Take 300 mg by mouth daily.    [provider]  Cyanocobalamin  (VITAMIN B-12) 5000 MCG TBDP Take 5,000 mcg by mouth in the morning.    [provider]  diphenhydrAMINE (BENADRYL) 50 MG tablet Take 50 mg by mouth at bedtime. Sleepaid    [provider]  docusate sodium  (COLACE) 100 MG capsule Take 100 mg by mouth 2 (two) times daily.    [provider]  famotidine  (PEPCID ) 20 MG tablet Take 20 mg by mouth daily as needed for heartburn or indigestion.    [provider]  Flaxseed Oil OIL Take 1,000 mg by mouth in the morning.    [provider]  Homeopathic Products (LEG CRAMPS) TABS Take 1 tablet by mouth at bedtime. Magnesium     [provider]  levothyroxine  (SYNTHROID ) 50 MCG tablet Take 50 mcg by mouth daily before breakfast.    [provider]  nitroGLYCERIN  (NITROSTAT ) 0.4 MG SL tablet Place 1 tablet (0.4 mg total) under the tongue  every 5 (five) minutes as needed for chest pain. Do not take nitroglycerine within 48 hours of taking viagra 10/30/23   Meng, Hao, PA  OVER THE COUNTER MEDICATION Place 2 sprays into both nostrils as needed (Allergy). [facility] allergy relief    [provider]  sildenafil (VIAGRA) 100 MG tablet Take 100 mg by mouth as needed for erectile dysfunction.    [provider]  Simethicone  (GAS FREE EXTRA STRENGTH PO) Take 125 mg by mouth daily as needed (gas).    [provider]  traZODone  (DESYREL ) 50 MG tablet Take 50 mg by mouth at bedtime.    [provider]    Physical Exam: Vitals:   12/31/23 1352 12/31/23 1400 12/31/23 1412  BP:  114/68   Pulse:  66   Resp:  16  SpO2:  96%   Weight: 91.8 kg  91 kg  Height:   6' (1.829 m)    Physical Exam Constitutional:      General: He is not in acute distress.    Appearance: Normal appearance.  HENT:     Head: Normocephalic and atraumatic.     Mouth/Throat:     Mouth: Mucous membranes are moist.     Pharynx: Oropharynx is clear.  Eyes:     Extraocular Movements: Extraocular movements intact.     Pupils: Pupils are equal, round, and reactive to light.  Cardiovascular:     Rate and Rhythm: Normal rate and regular rhythm.     Pulses: Normal pulses.     Heart sounds: Normal heart sounds.  Pulmonary:     Effort: Pulmonary effort is normal. No respiratory distress.     Breath sounds: Normal breath sounds.  Abdominal:     General: Bowel sounds are normal. There is no distension.     Palpations: Abdomen is soft.     Tenderness: There is no abdominal tenderness.  Musculoskeletal:        General: No swelling or deformity.  Skin:    General: Skin is warm and dry.  Neurological:     Comments: Mental Status: Patient is awake, alert, oriented x3 No signs of neglect, has had some word finding difficulty but not noticeable on my exam Cranial Nerves: II: Pupils equal, round, and reactive to light.   III,IV, VI:  EOMI without ptosis or diploplia.  V: Facial sensation is symmetric to light touch. VII: Facial droop on left VIII: hearing is intact to voice X: Uvula elevates symmetrically XI: Shoulder shrug is symmetric. XII: tongue is midline without atrophy or fasciculations.  Motor: Good effort thorughout, at Least 4/5 LUE, 5/5 RUE, 5/5 bilateral lower extremitiy  Sensory: Sensation is reduced/abnormal in LUE, otherwise intact. Cerebellar: Finger-Nose intact bilalat    Labs on Admission: I have personally reviewed following labs and imaging studies  CBC: Recent Labs  Lab 12/31/23 1351 12/31/23 1353  WBC 4.0  --   NEUTROABS 1.9  --   HGB 12.7* 12.6*  HCT 37.9* 37.0*  MCV 93.6  --   PLT 153  --     Basic Metabolic Panel: Recent Labs  Lab 12/31/23 1351 12/31/23 1353  NA 139 141  K 3.9 4.0  CL 106 105  CO2 23  --   GLUCOSE 99 92  BUN 20 23  CREATININE 1.33* 1.40*  CALCIUM  9.0  --     GFR: Estimated Creatinine Clearance: 43.9 mL/min (A) (by C-G formula based on SCr of 1.4 mg/dL (H)).  Liver Function Tests: Recent Labs  Lab 12/31/23 1351  AST 32  ALT 40  ALKPHOS 45  BILITOT 1.0  PROT 6.0*  ALBUMIN  3.6    Urine analysis:    Component Value Date/Time   COLORURINE YELLOW 04/25/2022 0918   APPEARANCEUR CLEAR 04/25/2022 0918   LABSPEC 1.015 04/25/2022 0918   PHURINE 6.0 04/25/2022 0918   GLUCOSEU NEGATIVE 04/25/2022 0918   HGBUR NEGATIVE 04/25/2022 0918   BILIRUBINUR NEGATIVE 04/25/2022 0918   KETONESUR NEGATIVE 04/25/2022 0918   PROTEINUR NEGATIVE 04/25/2022 0918   NITRITE NEGATIVE 04/25/2022 0918   LEUKOCYTESUR NEGATIVE 04/25/2022 0918    Radiological Exams on Admission: CT ANGIO HEAD NECK W WO CM W PERF (CODE STROKE) Result Date: 12/31/2023 CLINICAL DATA:  Neuro deficit, acute, stroke suspected EXAM: CT ANGIOGRAPHY OF THE HEAD AND NECK CT PERFUSION BRAIN TECHNIQUE: Multidetector CT  imaging of the head and neck was performed using the standard protocol during  bolus administration of intravenous contrast. Multiplanar CT image reconstructions and MIPs were obtained to evaluate the vascular anatomy. Carotid stenosis measurements (when applicable) are obtained utilizing NASCET criteria, using the distal internal carotid diameter as the denominator. Multiphase CT imaging of the brain was performed following IV bolus contrast injection. Subsequent parametric perfusion maps were calculated using RAPID software. RADIATION DOSE REDUCTION: This exam was performed according to the departmental dose-optimization program which includes automated exposure control, adjustment of the mA and/or kV according to patient size and/or use of iterative reconstruction technique. CONTRAST:  OMNIPAQUE  IOHEXOL  350 MG/ML SOLN COMPARISON:  CT the head dated December 31, 2023. FINDINGS: CTA NECK Aortic arch: Normal caliber. Mild calcific atheromatous disease. Standard three-vessel origin of the great arteries. The brachiocephalic artery is widely patent. Right carotid system: There is moderate calcific plaque within the right carotid bulb and origin of the internal carotid artery, with approximately 50% luminal stenosis. The remainder of the cervical segment of the internal carotid arteries normal in caliber. Left carotid system: There is moderate calcific plaque within the left carotid bulb with less than 50% luminal stenosis. The remainder of the cervical segment of the internal carotid arteries normal in caliber. Vertebral arteries:The vertebral arteries are codominant and normal in caliber throughout the respective courses. Skeleton: Negative. Other neck: Negative. CTA HEAD Anterior circulation: There is mild to moderate calcific atheromatous disease within the carotid siphons and supraclinoid segments of the internal carotid arteries bilaterally, with moderate stenosis, approximately 50% bilaterally. The anterior and middle cerebral arteries and their branches appear normal in caliber. There  is no evidence of large vessel occlusion, flow-limiting stenosis or aneurysm. Posterior circulation: The vertebrobasilar system is unremarkable. The posterior cerebral arteries and the cerebellar arteries are normal in caliber. Venous sinuses: Patent. Anatomic variants: None. Delayed phase: Unremarkable. CT Brain Perfusion Findings: ASPECTS: 10. CBF (<30%) Volume: 0mL Perfusion (Tmax>6.0s) volume: 0mL Mismatch Volume: 0mL Infarction Location:Not applicable. IMPRESSION: 1. Moderate calcific plaque within the carotid bulbs bilaterally, with approximately 50% stenosis on the right and less than 50% stenosis on the left. 2. Moderate calcific atheromatous disease within the cavernous and supraclinoid segments of the internal carotid arteries bilaterally, with estimated stenosis 50%. 3. No evidence of large vessel occlusion. 4. Normal CT perfusion of the head. These results were called by telephone at the time of interpretation on 12/31/2023 at 2:18 pm to provider Dr. CAMELLIA SHARK , who verbally acknowledged these results. Electronically Signed   By: Evalene Coho M.D.   On: 12/31/2023 14:26   CT HEAD CODE STROKE WO CONTRAST Result Date: 12/31/2023 CLINICAL DATA:  Code stroke.  Left facial droop. EXAM: CT HEAD WITHOUT CONTRAST TECHNIQUE: Contiguous axial images were obtained from the base of the skull through the vertex without intravenous contrast. RADIATION DOSE REDUCTION: This exam was performed according to the departmental dose-optimization program which includes automated exposure control, adjustment of the mA and/or kV according to patient size and/or use of iterative reconstruction technique. COMPARISON:  None Available. FINDINGS: Brain: Age-related atrophy. Mild cerebral white matter disease. No evidence of hemorrhage, mass, cortical infarct or hydrocephalus. Vascular: Negative. Skull: Intact and unremarkable. Sinuses/Orbits: Mild mucosal disease within the ethmoid air cells. Status post bilateral lens  replacement. Other: None. IMPRESSION: 1. Mild small vessel disease. 2. ASPECTS is 10. 3. These results were communicated to Dr. Lindzen at 1:58 pm on 12/31/2023 by text page via the [facility] messaging system. Electronically Signed  By: Evalene Coho M.D.   On: 12/31/2023 14:00   EKG: Independently reviewed.  Sinus rhythm at 59 bpm.  Nonspecific T wave changes.  Assessment/Plan Principal Problem:   Acute CVA (cerebrovascular accident) [facility]) Active Problems:   Restless leg syndrome   CAD (coronary artery disease), native coronary artery   S/P AVR (aortic valve replacement)   Hyperlipidemia   Status post coronary artery bypass graft   Chronic kidney disease   GERD (gastroesophageal reflux disease)   PUD (peptic ulcer disease)   S/P Nissen fundoplication (without gastrostomy tube) procedure   Hypothyroidism  Acute CVA > Presenting with facial droop, left-sided weakness, word finding difficulty. > CT head without acute abnormality.  CTA head and neck and perfusion study showed carotid disease that was moderate but no LVO and normal perfusion study. > Neurology consulted in the ED, stroke workup begun, MRI brain pending. - Appreciate neurology recommendations and assistance - Allow for permissive HTN (systolic < 220 and diastolic < 120)  - Continue aspirin  and Plavix  - Continue home statin - Echocardiogram recently performed - Follow-up MRI - A1C  - Lipid panel  - Tele monitoring  - SLP eval - PT/OT  Hyperlipidemia - Continue home atorvastatin   Hypothyroidism - Continue home Synthroid   CKD - Creatinine stable at 1.32, trend renal function and electrolytes  CAD > Status post CABG - Continue home ASA, atorvastatin  - Plavix  as above  GERD - Status post Nissen fundoplication - Continue Pepcid   DVT prophylaxis: SCDs for now Code Status:   Full Family Communication:  None on admission Disposition Plan:   Patient is from:  Home  Anticipated DC to:  Home  Anticipated DC  date:  1 to 2 days  Anticipated DC barriers: None  Consults called:  Neurology Admission status:  Observation, telemetry  Severity of Illness: The appropriate patient status for this patient is OBSERVATION. Observation status is judged to be reasonable and necessary in order to provide the required intensity of service to ensure the patient's safety. The patient's presenting symptoms, physical exam findings, and initial radiographic and laboratory data in the context of their medical condition is felt to place them at decreased risk for further clinical deterioration. Furthermore, it is anticipated that the patient will be medically stable for discharge from the hospital within 2 midnights of admission.    Marsa KATHEE Scurry MD [facility]  How to contact the [facility] Attending or Consulting provider 7A - 7P or covering provider during after hours 7P -7A, for this patient?   Check the care team in [facility] and look for a) attending/consulting [facility] provider listed and b) the [facility] team listed Log into [email or web address] and use 's universal password to access. If you do not have the password, please contact the hospital operator. Locate the [facility] provider you are looking for under [facility] and page to a number that you can be directly reached. If you still have difficulty reaching the provider, please page the [facility] (Director on Call) for the Hospitalists listed on amion for assistance.  12/31/2023, 3:56 PM

## 2024-01-01 ENCOUNTER — Observation Stay (HOSPITAL_BASED_OUTPATIENT_CLINIC_OR_DEPARTMENT_OTHER)

## 2024-01-01 ENCOUNTER — Other Ambulatory Visit (HOSPITAL_COMMUNITY): Payer: Self-pay

## 2024-01-01 DIAGNOSIS — I6521 Occlusion and stenosis of right carotid artery: Secondary | ICD-10-CM

## 2024-01-01 DIAGNOSIS — I6340 Cerebral infarction due to embolism of unspecified cerebral artery: Secondary | ICD-10-CM

## 2024-01-01 DIAGNOSIS — I639 Cerebral infarction, unspecified: Secondary | ICD-10-CM | POA: Diagnosis not present

## 2024-01-01 DIAGNOSIS — I6523 Occlusion and stenosis of bilateral carotid arteries: Secondary | ICD-10-CM | POA: Diagnosis not present

## 2024-01-01 DIAGNOSIS — I6389 Other cerebral infarction: Secondary | ICD-10-CM | POA: Diagnosis not present

## 2024-01-01 DIAGNOSIS — R29703 NIHSS score 3: Secondary | ICD-10-CM | POA: Diagnosis not present

## 2024-01-01 DIAGNOSIS — I739 Peripheral vascular disease, unspecified: Secondary | ICD-10-CM

## 2024-01-01 DIAGNOSIS — Z7982 Long term (current) use of aspirin: Secondary | ICD-10-CM | POA: Diagnosis not present

## 2024-01-01 LAB — COMPREHENSIVE METABOLIC PANEL WITH GFR
ALT: 38 U/L (ref 0–44)
AST: 28 U/L (ref 15–41)
Albumin: 3.5 g/dL (ref 3.5–5.0)
Alkaline Phosphatase: 42 U/L (ref 38–126)
Anion gap: 9 (ref 5–15)
BUN: 16 mg/dL (ref 8–23)
CO2: 24 mmol/L (ref 22–32)
Calcium: 9.1 mg/dL (ref 8.9–10.3)
Chloride: 106 mmol/L (ref 98–111)
Creatinine, Ser: 0.97 mg/dL (ref 0.61–1.24)
GFR, Estimated: >60 mL/min (ref >60)
Glucose, Bld: 97 mg/dL (ref 70–99)
Potassium: 4 mmol/L (ref 3.5–5.1)
Sodium: 139 mmol/L (ref 135–145)
Total Bilirubin: 1 mg/dL (ref 0.0–1.2)
Total Protein: 5.9 g/dL — ABNORMAL LOW (ref 6.5–8.1)

## 2024-01-01 LAB — HEMOGLOBIN A1C
Hgb A1c MFr Bld: 5.3 % (ref 4.8–5.6)
Mean Plasma Glucose: 105.41 mg/dL

## 2024-01-01 LAB — CBC
HCT: 38.3 % — ABNORMAL LOW (ref 39.0–52.0)
Hemoglobin: 12.9 g/dL — ABNORMAL LOW (ref 13.0–17.0)
MCH: 30.9 pg (ref 26.0–34.0)
MCHC: 33.7 g/dL (ref 30.0–36.0)
MCV: 91.6 fL (ref 80.0–100.0)
Platelets: 153 K/uL (ref 150–400)
RBC: 4.18 MIL/uL — ABNORMAL LOW (ref 4.22–5.81)
RDW: 13 % (ref 11.5–15.5)
WBC: 4.5 K/uL (ref 4.0–10.5)
nRBC: 0 % (ref 0.0–0.2)

## 2024-01-01 LAB — LIPID PANEL
Cholesterol: 107 mg/dL (ref 0–200)
HDL: 43 mg/dL (ref >40)
LDL Cholesterol: 51 mg/dL (ref 0–99)
Total CHOL/HDL Ratio: 2.5 ratio
Triglycerides: 66 mg/dL (ref <150)
VLDL: 13 mg/dL (ref 0–40)

## 2024-01-01 MED ORDER — SODIUM CHLORIDE 0.9 % IV BOLUS
1000.0000 mL | Freq: Once | INTRAVENOUS | Status: AC
  Administered 2024-01-01: 1000 mL via INTRAVENOUS

## 2024-01-01 MED ORDER — CLOPIDOGREL BISULFATE 75 MG PO TABS
75.0000 mg | ORAL_TABLET | Freq: Every day | ORAL | 0 refills | Status: DC
  Filled 2024-01-01: qty 21, 21d supply, fill #0

## 2024-01-01 NOTE — Evaluation (Signed)
 Occupational Therapy Evaluation Patient Details Name: Jordan Hammond MRN: [id number] DOB: 17-May-1941 Today's Date: 01/01/2024   History of Present Illness   83 yo male presents to [facility] on 12/31/23 as a code stroke for L sided weakness and word finding difficulty. 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 PMH includes CABG x 2, HLD, AVR, GERD, CAD, hypothyroidism, anxiety.     Clinical Impressions Pt admitted for above, PTA pt reports being fully ind with ADLs/iADLs, driving and working without any deficits. Pt does note a recent hx of memory deficits as well as vision deficits in his R eye, naturally his eyelids are more drooped at baseline. His spouse has medical issues of her own and is not able to provide physical assist. Pt currently amb with CGA no AD, anticipate him to improve with more OOB attempts, completes ADLs with CGA to setup A. He does demonstrate impaired STM and would benefit from further assessment of cognitive abilities to ensure safety at home. His L side is mildly weaker than his R but he demonstrates impaired LUE coordination. OT to continue following pt acutely to address listed deficits. Recommend outpatient OT services for L weakness/coordination deficits.      If plan is discharge home, recommend the following:   Assistance with cooking/housework;Assist for transportation;Direct supervision/assist for medications management;Direct supervision/assist for financial management     Functional Status Assessment   Patient has had a recent decline in their functional status and demonstrates the ability to make significant improvements in function in a reasonable and predictable amount of time.     Equipment Recommendations   Other (comment)     Recommendations for Other Services   Speech consult (for cognition)     Precautions/Restrictions   Precautions Precautions: Fall Recall of Precautions/Restrictions:  Intact Restrictions Weight Bearing Restrictions Per Provider Order: No     Mobility Bed Mobility Overal bed mobility: Needs Assistance Bed Mobility: Supine to Sit     Supine to sit: Supervision          Transfers Overall transfer level: Needs assistance Equipment used: None Transfers: Sit to/from Stand Sit to Stand: Contact guard assist           General transfer comment: CGA for safety.      Balance Overall balance assessment: Needs assistance Sitting-balance support: No upper extremity supported, Feet supported Sitting balance-Leahy Scale: Good       Standing balance-Leahy Scale: Fair Standing balance comment: no AD                           ADL either performed or assessed with clinical judgement   ADL Overall ADL's : Needs assistance/impaired Eating/Feeding: Independent;Sitting   Grooming: Standing;Wash/dry face;Contact guard assist   Upper Body Bathing: Sitting;Set up   Lower Body Bathing: Set up;Sitting/lateral leans   Upper Body Dressing : Sitting;Set up   Lower Body Dressing: Contact guard assist;Sit to/from stand   Toilet Transfer: Contact guard Marine scientist Details (indicate cue type and reason): no AD Toileting- Clothing Manipulation and Hygiene: Contact guard assist;Sit to/from stand       Functional mobility during ADLs: Contact guard assist       Vision Baseline Vision/History:  (decreased vision in L eye, more blurry) Patient Visual Report: Blurring of vision Vision Assessment?: Yes Eye Alignment: Within Functional Limits Ocular Range of Motion: Within Functional Limits Alignment/Gaze Preference: Within Defined Limits Tracking/Visual Pursuits: Able to track stimulus in all  quads without difficulty Convergence: Impaired (comment) (limited convergence) Visual Fields: Other (comment) (Pt inconsistent with responses, made 2 errors to which he would state he saw 3 fingers despite only 1-2 being held.  This was noted typically in L eye peripheral field but he also denies double vision throughout) Depth Perception:  (no deficits noted) Additional Comments: Pt notes some baseline eye deficits, impaired vision in L eye.     Perception Perception: Not tested       Praxis Praxis: [facility]       Pertinent Vitals/Pain Pain Assessment Pain Assessment: Faces Faces Pain Scale: No hurt     Extremity/Trunk Assessment Upper Extremity Assessment Upper Extremity Assessment: LUE deficits/detail (RUE WFL for strength, coord, ROM and sensation) LUE Deficits / Details: L with some mild weakness compared to R, scored 4/5 on MMTs as R was full 5/5. decreased coordination with FTN and rapid alt movements. ROM WFLs, weak grip LUE Coordination: decreased fine motor   Lower Extremity Assessment Lower Extremity Assessment: Defer to PT evaluation (R > L)       Communication     Cognition Arousal: Alert                 OT - Cognition Comments: Pt reports increased challenges with memory as of late, scored 0/3 on short term word recall. pt performed clock draw with accuracy just needed an additional cue to recall what to set the time to. Had to perform cognitive testing while pt in transport chair en route to vascular. OT to follow-up as able to assess med management.                         Cueing  General Comments      VSS RA   Exercises     Shoulder Instructions      Home Living Family/patient expects to be discharged to:: Private residence Living Arrangements: Spouse/significant other Available Help at Discharge: Family;Available 24 hours/day Type of Home: House (townhouse) Home Access: Stairs to enter [employer] of Steps: 2 Entrance Stairs-Rails: None Home Layout: One level     Bathroom Shower/Tub: Chief Strategy Officer: Standard     Home Equipment: Grab bars - tub/shower;Rolling Environmental consultant (2 wheels);Rollator (4 wheels);[place] - single point  (spouse has a Occupational hygienist and RW for herself)   Additional Comments: wife has parkinson like symptoms, wife had a fall 7/22. Pt reports 1 fall a couple of weeks ago from tripping      Prior Functioning/Environment Prior Level of Function : Working/employed;Independent/Modified Independent;Driving             Mobility Comments: no AD ADLs Comments: ind    OT Problem List: Decreased strength;Impaired balance (sitting and/or standing);Decreased cognition   OT Treatment/Interventions: Self-care/ADL training;Balance training;Patient/family education;Therapeutic activities;DME and/or AE instruction      OT Goals(Current goals can be found in the care plan section)   Acute Rehab OT Goals Patient Stated Goal: To get health issues addressed prior to return home OT Goal Formulation: With patient Time For Goal Achievement: 01/15/24 Potential to Achieve Goals: Good ADL Goals Pt Will Perform Grooming: with modified independence;standing Pt Will Perform Lower Body Dressing: with modified independence;sit to/from stand Pt Will Transfer to Toilet: with modified independence;ambulating Pt Will Perform Tub/Shower Transfer: Tub transfer;with modified independence;ambulating Additional ADL Goal #1: Pt will score WNL on a medication management assessment to demonstrate ind with med management at home Additional ADL Goal #2: Pt will demonstrate  ind ability to complete a mutlistep iADL   OT Frequency:  Min 2X/week    Co-evaluation              AM-PAC OT 6 Clicks Daily Activity     Outcome Measure Help from another person eating meals?: None Help from another person taking care of personal grooming?: A Little Help from another person toileting, which includes using toliet, bedpan, or urinal?: A Little Help from another person bathing (including washing, rinsing, drying)?: A Little Help from another person to put on and taking off regular upper body clothing?: A Little Help from another  person to put on and taking off regular lower body clothing?: A Little 6 Click Score: 19   End of Session Equipment Utilized During Treatment: Gait belt  Activity Tolerance: Patient tolerated treatment well Patient left: in chair;with call bell/phone within reach;Other (comment) (in transport chair with transport staff)  OT Visit Diagnosis: Unsteadiness on feet (R26.81);Muscle weakness (generalized) (M62.81)                Time: 8973-8944 OT Time Calculation (min): 29 min Charges:  OT General Charges $OT Visit: 1 Visit OT Evaluation $OT Eval Low Complexity: 1 Low OT Treatments $Self Care/Home Management : 8-22 mins  01/01/2024  AB, OTR/L  Acute Rehabilitation Services  Office: [phone]   Curtistine JONETTA Das 01/01/2024, 12:05 PM

## 2024-01-01 NOTE — Plan of Care (Signed)

## 2024-01-01 NOTE — TOC Transition Note (Signed)
 Transition of Care [facility]) - Discharge Note   Patient Details  Name: Jordan Hammond MRN: [id number] Date of Birth: [birth year]  [facility] CM/SW Contact:  Andrez JULIANNA George, RN Phone Number: 01/01/2024, 3:56 PM   Clinical Narrative:     Pt is from home with his spouse. They are together most of the time.  Pt drives but spouse to provide transportation after d/c.  Pt manages his medications at home but wife to oversee them for at least a few days to make sure he is taking them correctly. Outpatient therapy referral sent to [facility]. Pt will call to schedule the first appointment. Information is on the AVS. Pt to return next week for TCAR.  Pt has transportation home.  Final next level of care: OP Rehab Barriers to Discharge: No Barriers Identified   Patient Goals and CMS Choice     Choice offered to / list presented to : Patient, Spouse      Discharge Placement                       Discharge Plan and Services Additional resources added to the After Visit Summary for                                       Social Drivers of Health (SDOH) Interventions SDOH Screenings   Food Insecurity: No Food Insecurity (12/31/2023)  Housing: Low Risk  (12/31/2023)  Transportation Needs: No Transportation Needs (12/31/2023)  Utilities: Not At Risk (12/31/2023)  Social Connections: Unknown (12/31/2023)  Stress: No Stress Concern Present (09/28/2021)   Received from [facility]  Tobacco Use: Low Risk  (12/31/2023)     Readmission Risk Interventions     No data to display

## 2024-01-01 NOTE — Evaluation (Signed)
 Physical Therapy Evaluation Patient Details Name: Jordan Hammond MRN: [id number] DOB: 1940/10/12 Today's Date: 01/01/2024  History of Present Illness  83 yo male presents to [facility] on 12/31/23 as a code stroke for L sided weakness and word finding difficulty. 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 PMH includes CABG x 2, HLD, AVR, GERD, CAD, hypothyroidism, anxiety.   Clinical Impression  Pt in bed upon arrival with wife present and agreeable to PT eval. PTA, pt was independent for mobility with no AD. In today's session, pt was ModI for bed mobility and supervision to stand with no AD. When ambulating with no challenges to balance, pt required supervision for safety. Pt had one loss of balance while performing horizontal head turns with lateral side-step recovery and CGA. Pt has 24/7 supervision available at home. Recommending post-acute OP PT to address balance deficits and improve mobility. Pt would benefit from acute skilled PT with current functional limitations listed below (see PT Problem List). Acute PT to follow.         If plan is discharge home, recommend the following: Assist for transportation;Help with stairs or ramp for entrance   Can travel by private vehicle    Yes    Equipment Recommendations None recommended by PT     Functional Status Assessment Patient has had a recent decline in their functional status and demonstrates the ability to make significant improvements in function in a reasonable and predictable amount of time.     Precautions / Restrictions Precautions Precautions: Fall Recall of Precautions/Restrictions: Intact Restrictions Weight Bearing Restrictions Per Provider Order: No      Mobility  Bed Mobility Overal bed mobility: Modified Independent Bed Mobility: Supine to Sit, Sit to Supine    Supine to sit: Modified independent (Device/Increase time) Sit to supine: Modified independent (Device/Increase time)        Transfers Overall transfer level: Needs assistance Equipment used: None Transfers: Sit to/from Stand Sit to Stand: Supervision    General transfer comment: supervision for safety, no physical assist needed    Ambulation/Gait Ambulation/Gait assistance: Contact guard assist, Supervision Gait Distance (Feet): 250 Feet Assistive device: None Gait Pattern/deviations: Step-through pattern, Decreased stride length Gait velocity: decr     General Gait Details: steady with no challenges to balance, slightly unsteady with head turns with CGA for safety and lateral side-step for recovery  Stairs Stairs: Yes Stairs assistance: Contact guard assist Stair Management: No rails, Forwards Number of Stairs: 5 General stair comments: CGA for safety with no handrails     Modified Rankin (Stroke Patients Only) Modified Rankin (Stroke Patients Only) Pre-Morbid Rankin Score: No symptoms Modified Rankin: Moderately severe disability     Balance Overall balance assessment: Needs assistance Sitting-balance support: No upper extremity supported, Feet supported Sitting balance-Leahy Scale: Good     Standing balance support: No upper extremity supported Standing balance-Leahy Scale: Fair      Standardized Balance Assessment Standardized Balance Assessment : Dynamic Gait Index   Dynamic Gait Index Level Surface: Mild Impairment Change in Gait Speed: Normal Gait with Horizontal Head Turns: Moderate Impairment Gait with Vertical Head Turns: Mild Impairment Gait and Pivot Turn: Normal Step Over Obstacle: Normal Step Around Obstacles: Normal Steps: Normal Total Score: 20       Pertinent Vitals/Pain Pain Assessment Pain Assessment: No/denies pain    Home Living Family/patient expects to be discharged to:: Private residence Living Arrangements: Spouse/significant other Available Help at Discharge: Family;Available 24 hours/day Type of Home: House (townhouse)  Home Access: Stairs  to enter Entrance Stairs-Rails: None Entrance Stairs-Number of Steps: 2   Home Layout: One level Home Equipment: Grab bars - tub/shower;Rolling Walker (2 wheels);Rollator (4 wheels);[place] - single point (spouse has RW and rollator for self) Additional Comments: wife has parkinson like symptoms, wife had a fall 7/22. Pt reports 1 fall a couple of weeks ago from tripping    Prior Function Prior Level of Function : Working/employed;Independent/Modified Independent;Driving    Mobility Comments: no AD ADLs Comments: ind     Extremity/Trunk Assessment   Upper Extremity Assessment Upper Extremity Assessment: Defer to OT evaluation LUE Deficits / Details: L with some mild weakness compared to R, scored 4/5 on MMTs as R was full 5/5. decreased coordination with FTN and rapid alt movements. ROM WFLs, weak grip LUE Coordination: decreased fine motor    Lower Extremity Assessment Lower Extremity Assessment: LLE deficits/detail LLE Deficits / Details: grossly 4+/5    Cervical / Trunk Assessment Cervical / Trunk Assessment: Normal  Communication   Communication Communication: No apparent difficulties    Cognition Arousal: Alert Behavior During Therapy: WFL for tasks assessed/performed   PT - Cognitive impairments: No apparent impairments    Following commands: Intact       Cueing Cueing Techniques: Verbal cues     General Comments General comments (skin integrity, edema, etc.): VSS RA     PT Assessment Patient needs continued PT services  PT Problem List Decreased strength;Decreased activity tolerance;Decreased balance;Decreased mobility       PT Treatment Interventions DME instruction;Gait training;Stair training;Functional mobility training;Therapeutic activities;Therapeutic exercise;Balance training;Neuromuscular re-education;Patient/family education    PT Goals (Current goals can be found in the Care Plan section)  Acute Rehab PT Goals Patient Stated Goal: to keep  being active PT Goal Formulation: With patient Time For Goal Achievement: 01/15/24 Potential to Achieve Goals: Good    Frequency Min 2X/week        AM-PAC PT 6 Clicks Mobility  Outcome Measure Help needed turning from your back to your side while in a flat bed without using bedrails?: None Help needed moving from lying on your back to sitting on the side of a flat bed without using bedrails?: None Help needed moving to and from a bed to a chair (including a wheelchair)?: A Little Help needed standing up from a chair using your arms (e.g., wheelchair or bedside chair)?: A Little Help needed to walk in hospital room?: A Little Help needed climbing 3-5 steps with a railing? : A Little 6 Click Score: 20    End of Session Equipment Utilized During Treatment: Gait belt Activity Tolerance: Patient tolerated treatment well Patient left: in bed;with call bell/phone within reach;with family/visitor present Nurse Communication: Mobility status PT Visit Diagnosis: Unsteadiness on feet (R26.81);Other abnormalities of gait and mobility (R26.89);Muscle weakness (generalized) (M62.81)    Time: 1447-1510 PT Time Calculation (min) (ACUTE ONLY): 23 min   Charges:   PT Evaluation $PT Eval Low Complexity: 1 Low   PT General Charges $$ ACUTE PT VISIT: 1 Visit       Kate ORN, PT, DPT Secure Chat Preferred  Rehab Office [phone]   Kate BRAVO Wendolyn 01/01/2024, 3:54 PM

## 2024-01-01 NOTE — Progress Notes (Addendum)
 STROKE TEAM PROGRESS NOTE    INTERIM HISTORY/SUBJECTIVE  Patient has remained hemodynamically stable and afebrile overnight.  He reports that his dysarthria has improved but continues to have left facial droop and left-sided weakness.  Vascular surgery has been consulted for right ICA stenosis and high risk plaque.  OBJECTIVE  CBC    Component Value Date/Time   WBC 4.5 01/01/2024 0359   RBC 4.18 (L) 01/01/2024 0359   HGB 12.9 (L) 01/01/2024 0359   HGB 14.6 10/31/2023 1650   HCT 38.3 (L) 01/01/2024 0359   HCT 43.2 10/31/2023 1650   PLT 153 01/01/2024 0359   PLT 176 10/31/2023 1650   MCV 91.6 01/01/2024 0359   MCV 94 10/31/2023 1650   MCH 30.9 01/01/2024 0359   MCHC 33.7 01/01/2024 0359   RDW 13.0 01/01/2024 0359   RDW 12.1 10/31/2023 1650   LYMPHSABS 1.4 12/31/2023 1351   MONOABS 0.5 12/31/2023 1351   EOSABS 0.2 12/31/2023 1351   BASOSABS 0.0 12/31/2023 1351    BMET    Component Value Date/Time   NA 139 01/01/2024 0359   NA 140 10/31/2023 1650   K 4.0 01/01/2024 0359   CL 106 01/01/2024 0359   CO2 24 01/01/2024 0359   GLUCOSE 97 01/01/2024 0359   BUN 16 01/01/2024 0359   BUN 22 10/31/2023 1650   CREATININE 0.97 01/01/2024 0359   CALCIUM  9.1 01/01/2024 0359   EGFR 58 (L) 10/31/2023 1650   GFRNONAA >60 01/01/2024 0359    IMAGING past 24 hours MR BRAIN WO CONTRAST Result Date: 12/31/2023 CLINICAL DATA:  Stroke follow-up.  Facial drooping. EXAM: MRI HEAD WITHOUT CONTRAST TECHNIQUE: Multiplanar, multiecho pulse sequences of the brain and surrounding structures were obtained without intravenous contrast. COMPARISON:  CT angiogram of the head and neck and is CT the head dated December 31, 2023. FINDINGS: Brain: There is a small streaky band of restricted diffusion within the cortex and sub cortical white matter of the right posterior frontal lobe, compatible with acute nonhemorrhagic focal cortical/subcortical infarct. There is age related cerebral volume loss and minimal  periventricular white matter disease. There is no evidence of hemorrhage, mass or hydrocephalus. Vascular: Normal vascular flow voids. Skull and upper cervical spine: Normal marrow signal. No bony lesions. Sinuses/Orbits: Mucosal disease within the ethmoid and sphenoid sinuses. Status post bilateral lens replacement. Other: None. IMPRESSION: 1. Acute small focal cortical/subcortical infarct in the right posterior frontal lobe. Electronically Signed   By: Evalene Coho M.D.   On: 12/31/2023 16:49   CT ANGIO HEAD NECK W WO CM W PERF (CODE STROKE) Result Date: 12/31/2023 CLINICAL DATA:  Neuro deficit, acute, stroke suspected EXAM: CT ANGIOGRAPHY OF THE HEAD AND NECK CT PERFUSION BRAIN TECHNIQUE: Multidetector CT imaging of the head and neck was performed using the standard protocol during bolus administration of intravenous contrast. Multiplanar CT image reconstructions and MIPs were obtained to evaluate the vascular anatomy. Carotid stenosis measurements (when applicable) are obtained utilizing NASCET criteria, using the distal internal carotid diameter as the denominator. Multiphase CT imaging of the brain was performed following IV bolus contrast injection. Subsequent parametric perfusion maps were calculated using RAPID software. RADIATION DOSE REDUCTION: This exam was performed according to the departmental dose-optimization program which includes automated exposure control, adjustment of the mA and/or kV according to patient size and/or use of iterative reconstruction technique. CONTRAST:  100mL OMNIPAQUE  IOHEXOL  350 MG/ML SOLN COMPARISON:  CT the head dated December 31, 2023. FINDINGS: CTA NECK Aortic arch: Normal caliber. Mild calcific atheromatous  disease. Standard three-vessel origin of the great arteries. The brachiocephalic artery is widely patent. Right carotid system: There is moderate calcific plaque within the right carotid bulb and origin of the internal carotid artery, with approximately 50%  luminal stenosis. The remainder of the cervical segment of the internal carotid arteries normal in caliber. Left carotid system: There is moderate calcific plaque within the left carotid bulb with less than 50% luminal stenosis. The remainder of the cervical segment of the internal carotid arteries normal in caliber. Vertebral arteries:The vertebral arteries are codominant and normal in caliber throughout the respective courses. Skeleton: Negative. Other neck: Negative. CTA HEAD Anterior circulation: There is mild to moderate calcific atheromatous disease within the carotid siphons and supraclinoid segments of the internal carotid arteries bilaterally, with moderate stenosis, approximately 50% bilaterally. The anterior and middle cerebral arteries and their branches appear normal in caliber. There is no evidence of large vessel occlusion, flow-limiting stenosis or aneurysm. Posterior circulation: The vertebrobasilar system is unremarkable. The posterior cerebral arteries and the cerebellar arteries are normal in caliber. Venous sinuses: Patent. Anatomic variants: None. Delayed phase: Unremarkable. CT Brain Perfusion Findings: ASPECTS: 10. CBF (<30%) Volume: 0mL Perfusion (Tmax>6.0s) volume: 0mL Mismatch Volume: 0mL Infarction Location:Not applicable. IMPRESSION: 1. Moderate calcific plaque within the carotid bulbs bilaterally, with approximately 50% stenosis on the right and less than 50% stenosis on the left. 2. Moderate calcific atheromatous disease within the cavernous and supraclinoid segments of the internal carotid arteries bilaterally, with estimated stenosis 50%. 3. No evidence of large vessel occlusion. 4. Normal CT perfusion of the head. These results were called by telephone at the time of interpretation on 12/31/2023 at 2:18 pm to provider Dr. CAMELLIA SHARK , who verbally acknowledged these results. Electronically Signed   By: Evalene Coho M.D.   On: 12/31/2023 14:26   CT HEAD CODE STROKE WO  CONTRAST Result Date: 12/31/2023 CLINICAL DATA:  Code stroke.  Left facial droop. EXAM: CT HEAD WITHOUT CONTRAST TECHNIQUE: Contiguous axial images were obtained from the base of the skull through the vertex without intravenous contrast. RADIATION DOSE REDUCTION: This exam was performed according to the departmental dose-optimization program which includes automated exposure control, adjustment of the mA and/or kV according to patient size and/or use of iterative reconstruction technique. COMPARISON:  None Available. FINDINGS: Brain: Age-related atrophy. Mild cerebral white matter disease. No evidence of hemorrhage, mass, cortical infarct or hydrocephalus. Vascular: Negative. Skull: Intact and unremarkable. Sinuses/Orbits: Mild mucosal disease within the ethmoid air cells. Status post bilateral lens replacement. Other: None. IMPRESSION: 1. Mild small vessel disease. 2. ASPECTS is 10. 3. These results were communicated to Dr. Lindzen at 1:58 pm on 12/31/2023 by text page via the [facility] messaging system. Electronically Signed   By: Evalene Coho M.D.   On: 12/31/2023 14:00    Vitals:   12/31/23 2346 01/01/24 0355 01/01/24 0755 01/01/24 0930  BP: 103/65 100/66 108/71 107/69  Pulse: (!) 58 68 72   Resp: 16 16 18    Temp: 97.6 F (36.4 C) (!) 97.5 F (36.4 C) 98.4 F (36.9 C)   TempSrc: Oral Oral Oral   SpO2: 96% 95% 94%   Weight:      Height:         PHYSICAL EXAM General:  Alert, well-nourished, well-developed patient in no acute distress Psych:  Mood and affect appropriate for situation Respiratory:  Regular, unlabored respirations on room air   NEURO:  Mental Status: AA&Ox3, patient is able to give clear and coherent history Speech/Language:  speech is with mild dysarthria but no aphasia, able to name objects without difficulty  Cranial Nerves:  II: PERRL. Visual fields full.  III, IV, VI: EOMI. Eyelids elevate symmetrically.  V: Sensation is intact to light touch and slightly  diminished on the left VII: Left facial droop VIII: hearing intact to voice. IX, X: Voice is slightly dysarthric KP:Dynloizm shrug 5/5. XII: tongue is midline without fasciculations. Motor: Able to move all 4 extremities with good antigravity strength, no drift present today Tone: is normal and bulk is normal Sensation- Intact to light touch bilaterally. Coordination: FTN intact bilaterally Gait- deferred  Most Recent NIH  1a Level of Conscious.: 0 1b LOC Questions: 0 1c LOC Commands: 0 2 Best Gaze: 0 3 Visual: 0 4 Facial Palsy: 1 5a Motor Arm - left: 0 5b Motor Arm - Right: 0 6a Motor Leg - Left: 0 6b Motor Leg - Right: 0 7 Limb Ataxia: 0 8 Sensory: 1 9 Best Language: 0 10 Dysarthria: 1 11 Extinct. and Inatten.: 0 TOTAL: 3   ASSESSMENT/PLAN  Jordan Hammond is a 83 y.o. male with history of CAD status post CABG, aortic stenosis post bioprosthetic aortic valve replacement and hyperlipidemia admitted for sudden onset left facial droop, dysarthria and left-sided weakness.  He was found to have an acute right posterior frontal lobe infarct on MRI.  Vascular surgery was consulted for high risk plaque in the right ICA.  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 Small vessel disease. ASPECTS 10.    CTA head & neck no LVO, moderate calcific plaque within bilateral carotid bulbs with 50% stenosis on the right, moderate calcific atheromatous disease within cavernous and supraclinoid segments of internal carotid arteries with about 50% bilateral stenosis CT perfusion no core or penumbra infarct seen MRI acute small focal cortical and subcortical infarct in right posterior frontal lobe Carotid Doppler R 40-59% stenosis 2D Echo EF 60 to 65%, no PFO Recommend 30 day cardiac event monitoring to rule out  afib (hx of CABG, AVR and follows with Dr. Almeta) LDL 51 HgbA1c 5.3 VTE prophylaxis - SCDs aspirin  81 mg daily prior to admission, now on aspirin  81 mg daily and clopidogrel  75 mg daily. 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 Lorena 05/2022 holter monitor no afib (2-3 days) monitoring On ASA PTA Now on DAPT Pt complained some heart palpitation for the last 2-3 weeks Recommend 30 day cardiac event monitoring to rule out afib   Hypertension/hypotension Home meds: None Stable on the low end S/p IV bolus Encourage po intake Avoid low BP given R carotid stenosis Long term BP goal normotensive  Hyperlipidemia Home meds: Atorvastatin  20 mg daily, resumed in hospital LDL 51, goal < 70 High intensity statin not indicated as LDL below goal Continue statin at discharge  Other Stroke Risk Factors Advanced age  Other Active Problems AKI, Cre 1.40-->0.97, s/p IVF  Hospital day # 0  Patient seen by NP and then by MD, MD to edit note as needed. Jordan Hammond , MSN, AGACNP-BC [facility] See Amion  for schedule and pager information 01/01/2024 10:50 AM   ATTENDING NOTE: I reviewed above note and agree with the assessment and plan. Pt was seen and examined.   No family at the bedside.  Patient sitting in chair, still has mild right facial droop and mild dysarthria, but otherwise neurologically intact.  MRI showed right frontal to small infarct, CT head and neck showed right ICA 50% stenosis with high risk plaque, carotid Doppler right ICA 40 to 59% stenosis.  Vascular surgery consulted.  Patient also has history of CABG and AVR, following with Dr. Pietro at cardiology.  Recommend 30-day  cardiac event monitoring as outpatient to rule out A-fib.  Currently on DAPT and statin. BP on the lower end, status post IV fluid.  Could be due to AKI, now creatinine normalized. PT and OT pending, will follow.  Discussed with Dr. Vonzell.  For detailed assessment and plan, please refer to above as I have made changes wherever appropriate.   Ary Cummins, MD PhD Stroke Neurology 01/01/2024 12:06 PM   To contact Stroke Continuity provider, please refer to [email or web address]. After hours, contact General Neurology

## 2024-01-01 NOTE — Progress Notes (Signed)
 Discussed risks / benefits / alternatives with patient and wife at bedside. I think the best course of action would be R TCAR. Reviewed low but real risk of stroke, CN injury, bleeding. Plan R TCAR next week. Safe for discharge.  Debby SAILOR. Magda, MD [facility] Phone Number: [phone] 01/01/2024 3:17 PM

## 2024-01-01 NOTE — H&P (View-Only) (Signed)
 Hospital Consult    Reason for Consult:  possible symptomatic carotid artery stenosis Requesting Physician:  Jerri MRN #:  [id number]  History of Present Illness: This is a 83 y.o. male who is being worked up for stroke.   Pt states that yesterday morning, he looked in the mirror and noticed the left side of his mouth was drooping.  He states his wife was a Charity fundraiser and felt they should call 911 and he was brought to the hospital.   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   He states that over the past 3 months, he has noticed that when he speaks, he feels like he has a mouth full of food and sometimes not able to speak what he wants to say.  He states that it has gotten worse and probably happens every day.  He states that a few weeks ago, he was having some fatigue and given his cardiac history, he was worked up for that and he states those tests turned out ok.  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   He does have left arm weakness, however, this has been present since 2012 and he does not think it has changed.  He has hx of shoulder issues and weakness due to that and this is unchanged.    He states that he was on Lipitor for 2-3 months after his heart surgery but has not been on that at home.  He has been on daily asa.    He has hx of bowel obstruction in 2006 with laparotomy.  He states he coded after that surgery.   He has hx of CABG and AVR.  The pt is on a statin for cholesterol management.  The pt is on a daily aspirin .   Other AC:  Plavix  The pt is not on medication for hypertension.   The pt is not on medication for diabetes PTA. Tobacco hx:  never  He denies family hx of AAA but there is family hx of brain aneurysms. His dad had CABG at young age and states he had carotid blockages as well.   Past Medical History:  Diagnosis Date   Aortic atherosclerosis  (HCC) 01/20/2017   Aortic stenosis 01/20/2017   CAD (coronary artery disease), native coronary artery 01/20/2017   GERD (gastroesophageal reflux disease)    Hiatal hernia    Hyperlipidemia    Kidney stones    Restless leg syndrome     Past Surgical History:  Procedure Laterality Date   AORTIC VALVE REPLACEMENT N/A 01/29/2017   Procedure: AORTIC VALVE REPLACEMENT (AVR);  Surgeon: Fleeta Ochoa, Maude, MD;  Location: [facility] OR;  Service: Open Heart Surgery;  Laterality: N/A;   APPENDECTOMY     bowel obstruction surgery     CHOLECYSTECTOMY     CORONARY ARTERY BYPASS GRAFT N/A 01/29/2017   Procedure: CORONARY ARTERY BYPASS GRAFTING (CABG)  x two, using left internal mammary artery and right leg greater saphenous vein harvested endoscopically;  Surgeon: Fleeta Ochoa Maude, MD;  Location: [facility] OR;  Service: Open Heart Surgery;  Laterality: N/A;   KNEE ARTHROSCOPY     lap nissan     LEFT HEART CATH AND CORONARY ANGIOGRAPHY N/A 01/21/2017   Procedure: LEFT HEART CATH AND CORONARY ANGIOGRAPHY;  Surgeon: Court Dorn PARAS, MD;  Location: [facility] INVASIVE CV LAB;  Service: Cardiovascular;  Laterality: N/A;   LEFT HEART CATH AND CORS/GRAFTS ANGIOGRAPHY N/A 11/06/2023   Procedure: LEFT HEART CATH AND CORS/GRAFTS ANGIOGRAPHY;  Surgeon: Sw[place], Peter M, MD;  Location: [facility] INVASIVE CV LAB;  Service: Cardiovascular;  Laterality: N/A;   RIGHT HEART CATH N/A 01/21/2017   Procedure: RIGHT HEART CATH;  Surgeon: Court Dorn PARAS, MD;  Location: [facility] INVASIVE CV LAB;  Service: Cardiovascular;  Laterality: N/A;   SHOULDER SURGERY     TEE WITHOUT CARDIOVERSION N/A 01/29/2017   Procedure: TRANSESOPHAGEAL ECHOCARDIOGRAM (TEE);  Surgeon: Fleeta Ochoa, Maude, MD;  Location: [facility] OR;  Service: Open Heart Surgery;  Laterality: N/A;    Allergies  Allergen Reactions   Pravastatin Nausea And Vomiting   Aspirin  Nausea And Vomiting    Very heavy doses   Atorvastatin  Other (See Comments)   Erythromycin Nausea And Vomiting    Unsure if there are  other reactions   Fentanyl     Ibuprofen Nausea And Vomiting    Very heavy doses   Nabumetone Nausea And Vomiting   Nsaids Nausea And Vomiting    Very heavy doses   Sulfa Antibiotics    Tetracycline Nausea And Vomiting    Unsure if there are other reactions   Elemental Sulfur Itching, Rash and Swelling   Sulfamethoxazole Itching, Rash and Swelling    Prior to Admission medications   Medication Sig Start Date End Date Taking? Authorizing Provider  acetaminophen  (TYLENOL ) 325 MG tablet Take 650 mg by mouth in the morning.    [provider]  aspirin  EC 81 MG EC tablet Take 1 tablet (81 mg total) by mouth daily. 02/04/17   Dwan Kyla HERO, PA-C  atorvastatin  (LIPITOR) 20 MG tablet Take 1 tablet (20 mg total) by mouth daily. Patient taking differently: Take 20 mg by mouth at bedtime. 12/28/19   Tobb, Kardie, DO  calcium  carbonate (CALCIUM  600) 600 MG TABS tablet Take 600 mg by mouth daily. With D3    [provider]  Cholecalciferol (VITAMIN D3) 5000 units CAPS Take 5,000 Units by mouth daily.    [provider]  clonazePAM  (KLONOPIN ) 1 MG tablet Take 1 mg by mouth at bedtime.    [provider]  Coenzyme Q10 (CO Q 10 PO) Take 300 mg by mouth daily.    [provider]  Cyanocobalamin  (VITAMIN B-12) 5000 MCG TBDP Take 5,000 mcg by mouth in the morning.    [provider]  diphenhydrAMINE (BENADRYL) 50 MG tablet Take 50 mg by mouth at bedtime. Sleepaid    [provider]  docusate sodium  (COLACE) 100 MG capsule Take 100 mg by mouth 2 (two) times daily.    [provider]  famotidine  (PEPCID ) 20 MG tablet Take 20 mg by mouth daily as needed for heartburn or indigestion.    [provider]  Flaxseed Oil OIL Take 1,000 mg by mouth in the morning.    [provider]  Homeopathic Products (LEG CRAMPS) TABS Take 1 tablet by mouth at bedtime. Magnesium     [provider]  levothyroxine  (SYNTHROID )  50 MCG tablet Take 50 mcg by mouth daily before breakfast.    [provider]  nitroGLYCERIN  (NITROSTAT ) 0.4 MG SL tablet Place 1 tablet (0.4 mg total) under the tongue every 5 (five) minutes as needed for chest pain. Do not take nitroglycerine within 48 hours of taking viagra 10/30/23   Meng, Hao, PA  OVER THE COUNTER MEDICATION Place 2 sprays into both nostrils as needed (Allergy). [facility] allergy relief  [provider]  sildenafil (VIAGRA) 100 MG tablet Take 100 mg by mouth as needed for erectile dysfunction.    [provider]  Simethicone  (GAS FREE EXTRA STRENGTH PO) Take 125 mg by mouth daily as needed (gas).    [provider]  traZODone  (DESYREL ) 50 MG tablet Take 50 mg by mouth at bedtime.    [provider]    Social History   Socioeconomic History   Marital status: Married    Spouse name: Not on file   Number of children: Not on file   Years of education: Not on file   Highest education level: Not on file  Occupational History   Not on file  Tobacco Use   Smoking status: Never   Smokeless tobacco: Never  Vaping Use   Vaping status: Never Used  Substance and Sexual Activity   Alcohol use: No   Drug use: No   Sexual activity: Yes  Other Topics Concern   Not on file  Social History Narrative   Divorced, remarried.  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   Social Drivers of Corporate investment banker Strain: Not on [employer] Insecurity: No Food Insecurity (12/31/2023)   Hunger Vital Sign    Worried About Running Out of Food in the Last Year: Never true    Ran Out of Food in the Last Year: Never true  Transportation Needs: No Transportation Needs (12/31/2023)   PRAPARE - Administrator, Civil Service (Medical): No    Lack of Transportation (Non-Medical): No  Physical Activity: Not on file  Stress: No Stress Concern Present (09/28/2021)   Received from [facility] of Occupational Health - Occupational Stress Questionnaire    Feeling of Stress : Not at all  Social Connections: Unknown (12/31/2023)   Social Connection and Isolation Panel    Frequency of Communication with Friends and Family: More than three times a week    Frequency of Social Gatherings with Friends and Family: More than three times a week    Attends Religious Services: More than 4 times per year    Active Member of [employer] or Organizations: Patient declined    Attends Banker Meetings: Patient declined    Marital Status: Married  Catering manager Violence: Not At Risk (12/31/2023)   Humiliation, Afraid, Rape, and Kick questionnaire    Fear of Current or Ex-Partner: No    Emotionally Abused: No    Physically Abused: No    Sexually Abused: No     Family History  Problem Relation Age of Onset   CAD Father        s/p triple bypass   Dementia Mother     ROS: [x]  Positive   [ ]  Negative   [ ]  All sytems reviewed and are negative  Cardiac: [x]  hx CABG, AVR (prosthetic)  Vascular: []  pain in legs while walking []  pain in legs at rest []  pain in legs at night []  non-healing ulcers []  hx of DVT []  swelling in legs  Pulmonary: []  asthma/wheezing []  home O2  Neurologic: [x]  hx of CVA []  mini stroke   Hematologic: []  hx of cancer  Endocrine:   []  diabetes [x]  thyroid  disease  GI [x]  GERD with hx of Nissen fundoplication  GU: []  CKD/renal failure []  HD--[]  M/W/F or []  T/T/S  Psychiatric: []  anxiety []  depression  Musculoskeletal: []  arthritis []  joint pain  Integumentary: []   rashes []  ulcers  Constitutional: []  fever  []  chills  Physical Examination  Vitals:   12/31/23 2346 01/01/24 0355  BP: 103/65 100/66  Pulse: (!) 58 68  Resp: 16 16  Temp: 97.6 F (36.4 C) (!) 97.5 F (36.4 C)  SpO2: 96% 95%   Body mass index is 27.21 kg/m.  General:  WDWN in NAD Gait: Not observed HENT: WNL, normocephalic Pulmonary: normal  non-labored breathing Cardiac: regular Abdomen:  soft, NT; aortic pulse is not palpable Skin: without rashes Vascular Exam/Pulses:  Right Left  Radial 2+ (normal) 2+ (normal)  DP 2+ (normal) 2+ (normal)   Extremities: left hand grip is weak otherwise, no deficits Musculoskeletal: no muscle wasting or atrophy  Neurologic: A&O X 3; speech is articulate but slow at times Psychiatric:  The pt has Normal affect.   CBC    Component Value Date/Time   WBC 4.5 01/01/2024 0359   RBC 4.18 (L) 01/01/2024 0359   HGB 12.9 (L) 01/01/2024 0359   HGB 14.6 10/31/2023 1650   HCT 38.3 (L) 01/01/2024 0359   HCT 43.2 10/31/2023 1650   PLT 153 01/01/2024 0359   PLT 176 10/31/2023 1650   MCV 91.6 01/01/2024 0359   MCV 94 10/31/2023 1650   MCH 30.9 01/01/2024 0359   MCHC 33.7 01/01/2024 0359   RDW 13.0 01/01/2024 0359   RDW 12.1 10/31/2023 1650   LYMPHSABS 1.4 12/31/2023 1351   MONOABS 0.5 12/31/2023 1351   EOSABS 0.2 12/31/2023 1351   BASOSABS 0.0 12/31/2023 1351    BMET    Component Value Date/Time   NA 139 01/01/2024 0359   NA 140 10/31/2023 1650   K 4.0 01/01/2024 0359   CL 106 01/01/2024 0359   CO2 24 01/01/2024 0359   GLUCOSE 97 01/01/2024 0359   BUN 16 01/01/2024 0359   BUN 22 10/31/2023 1650   CREATININE 0.97 01/01/2024 0359   CALCIUM  9.1 01/01/2024 0359   GFRNONAA >60 01/01/2024 0359   GFRAA >60 02/03/2017 0527    COAGS: Lab Results  Component Value Date   INR 1.1 12/31/2023   INR 1.46 01/29/2017   INR 0.98 01/24/2017     Non-Invasive Vascular Imaging:   CTA head/neck 01/01/2024 IMPRESSION: 1. Moderate calcific plaque within the carotid bulbs bilaterally, with approximately 50% stenosis on the right and less than 50% stenosis on the left. 2. Moderate calcific atheromatous disease within the cavernous and supraclinoid segments of the internal carotid arteries bilaterally, with estimated stenosis 50%. 3. No evidence of large vessel occlusion. 4. Normal CT  perfusion of the head.  MRI 01/01/2024 IMPRESSION: 1. Acute small focal cortical/subcortical infarct in the right posterior frontal lobe.  ASSESSMENT/PLAN: This is a 83 y.o. male with new right posterior frontal lobe infarct.     -CTA reveals ~ 50% bilateral stenosis (less on the left).  He has left facial droop and hx of left arm weakness.  After speaking with him, his left arm weakness has been present since 2012 and is unchanged.  He has had some speech issues over the past 2-3 months that he states has gotten worse with time.   -will get carotid duplex for more information-this order has been placed.  -he is on daily asa and statin and plavix  have been ordered and started.  -Dr. Magda to evaluate pt and determine further plan   Lucie Apt, PA-C [facility] [phone]  VASCULAR STAFF ADDENDUM: I have independently interviewed and examined the patient. I agree with the above.  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 Checking carotid duplex. Recommend DAPT, high intensity statin. Plan R TCAR in near future unless carotid duplex is highly discordant.  Debby SAILOR. Magda, MD [facility] Phone Number: [phone] 01/01/2024 11:14 AM

## 2024-01-01 NOTE — Consult Note (Addendum)
 Hospital Consult    Reason for Consult:  possible symptomatic carotid artery stenosis Requesting Physician:  Jerri MRN #:  [id number]  History of Present Illness: This is a 83 y.o. male who is being worked up for stroke.   Pt states that yesterday morning, he looked in the mirror and noticed the left side of his mouth was drooping.  He states his wife was a Charity fundraiser and felt they should call 911 and he was brought to the hospital.   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   He states that over the past 3 months, he has noticed that when he speaks, he feels like he has a mouth full of food and sometimes not able to speak what he wants to say.  He states that it has gotten worse and probably happens every day.  He states that a few weeks ago, he was having some fatigue and given his cardiac history, he was worked up for that and he states those tests turned out ok.  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   He does have left arm weakness, however, this has been present since 2012 and he does not think it has changed.  He has hx of shoulder issues and weakness due to that and this is unchanged.    He states that he was on Lipitor for 2-3 months after his heart surgery but has not been on that at home.  He has been on daily asa.    He has hx of bowel obstruction in 2006 with laparotomy.  He states he coded after that surgery.   He has hx of CABG and AVR.  The pt is on a statin for cholesterol management.  The pt is on a daily aspirin .   Other AC:  Plavix  The pt is not on medication for hypertension.   The pt is not on medication for diabetes PTA. Tobacco hx:  never  He denies family hx of AAA but there is family hx of brain aneurysms. His dad had CABG at young age and states he had carotid blockages as well.   Past Medical History:  Diagnosis Date   Aortic atherosclerosis  (HCC) 01/20/2017   Aortic stenosis 01/20/2017   CAD (coronary artery disease), native coronary artery 01/20/2017   GERD (gastroesophageal reflux disease)    Hiatal hernia    Hyperlipidemia    Kidney stones    Restless leg syndrome     Past Surgical History:  Procedure Laterality Date   AORTIC VALVE REPLACEMENT N/A 01/29/2017   Procedure: AORTIC VALVE REPLACEMENT (AVR);  Surgeon: Fleeta Ochoa, Maude, MD;  Location: [facility] OR;  Service: Open Heart Surgery;  Laterality: N/A;   APPENDECTOMY     bowel obstruction surgery     CHOLECYSTECTOMY     CORONARY ARTERY BYPASS GRAFT N/A 01/29/2017   Procedure: CORONARY ARTERY BYPASS GRAFTING (CABG)  x two, using left internal mammary artery and right leg greater saphenous vein harvested endoscopically;  Surgeon: Fleeta Ochoa Maude, MD;  Location: [facility] OR;  Service: Open Heart Surgery;  Laterality: N/A;   KNEE ARTHROSCOPY     lap nissan     LEFT HEART CATH AND CORONARY ANGIOGRAPHY N/A 01/21/2017   Procedure: LEFT HEART CATH AND CORONARY ANGIOGRAPHY;  Surgeon: Court Dorn PARAS, MD;  Location: [facility] INVASIVE CV LAB;  Service: Cardiovascular;  Laterality: N/A;   LEFT HEART CATH AND CORS/GRAFTS ANGIOGRAPHY N/A 11/06/2023   Procedure: LEFT HEART CATH AND CORS/GRAFTS ANGIOGRAPHY;  Surgeon: Swazil[place], Peter M, MD;  Location: [facility] INVASIVE CV LAB;  Service: Cardiovascular;  Laterality: N/A;   RIGHT HEART CATH N/A 01/21/2017   Procedure: RIGHT HEART CATH;  Surgeon: Court Dorn PARAS, MD;  Location: [facility] INVASIVE CV LAB;  Service: Cardiovascular;  Laterality: N/A;   SHOULDER SURGERY     TEE WITHOUT CARDIOVERSION N/A 01/29/2017   Procedure: TRANSESOPHAGEAL ECHOCARDIOGRAM (TEE);  Surgeon: Fleeta Ochoa, Maude, MD;  Location: [facility] OR;  Service: Open Heart Surgery;  Laterality: N/A;    Allergies  Allergen Reactions   Pravastatin Nausea And Vomiting   Aspirin  Nausea And Vomiting    Very heavy doses   Atorvastatin  Other (See Comments)   Erythromycin Nausea And Vomiting    Unsure if there are  other reactions   Fentanyl     Ibuprofen Nausea And Vomiting    Very heavy doses   Nabumetone Nausea And Vomiting   Nsaids Nausea And Vomiting    Very heavy doses   Sulfa Antibiotics    Tetracycline Nausea And Vomiting    Unsure if there are other reactions   Elemental Sulfur Itching, Rash and Swelling   Sulfamethoxazole Itching, Rash and Swelling    Prior to Admission medications   Medication Sig Start Date End Date Taking? Authorizing Provider  acetaminophen  (TYLENOL ) 325 MG tablet Take 650 mg by mouth in the morning.    [provider]  aspirin  EC 81 MG EC tablet Take 1 tablet (81 mg total) by mouth daily. 02/04/17   Dwan Kyla HERO, PA-C  atorvastatin  (LIPITOR) 20 MG tablet Take 1 tablet (20 mg total) by mouth daily. Patient taking differently: Take 20 mg by mouth at bedtime. 12/28/19   Tobb, Kardie, DO  calcium  carbonate (CALCIUM  600) 600 MG TABS tablet Take 600 mg by mouth daily. With D3    [provider]  Cholecalciferol (VITAMIN D3) 5000 units CAPS Take 5,000 Units by mouth daily.    [provider]  clonazePAM  (KLONOPIN ) 1 MG tablet Take 1 mg by mouth at bedtime.    [provider]  Coenzyme Q10 (CO Q 10 PO) Take 300 mg by mouth daily.    [provider]  Cyanocobalamin  (VITAMIN B-12) 5000 MCG TBDP Take 5,000 mcg by mouth in the morning.    [provider]  diphenhydrAMINE (BENADRYL) 50 MG tablet Take 50 mg by mouth at bedtime. Sleepaid    [provider]  docusate sodium  (COLACE) 100 MG capsule Take 100 mg by mouth 2 (two) times daily.    [provider]  famotidine  (PEPCID ) 20 MG tablet Take 20 mg by mouth daily as needed for heartburn or indigestion.    [provider]  Flaxseed Oil OIL Take 1,000 mg by mouth in the morning.    [provider]  Homeopathic Products (LEG CRAMPS) TABS Take 1 tablet by mouth at bedtime. Magnesium     [provider]  levothyroxine  (SYNTHROID )  50 MCG tablet Take 50 mcg by mouth daily before breakfast.    [provider]  nitroGLYCERIN  (NITROSTAT ) 0.4 MG SL tablet Place 1 tablet (0.4 mg total) under the tongue every 5 (five) minutes as needed for chest pain. Do not take nitroglycerine within 48 hours of taking viagra 10/30/23   Meng, Hao, PA  OVER THE COUNTER MEDICATION Place 2 sprays into both nostrils as needed (Allergy). [facility] allergy relief  [provider]  sildenafil (VIAGRA) 100 MG tablet Take 100 mg by mouth as needed for erectile dysfunction.    [provider]  Simethicone  (GAS FREE EXTRA STRENGTH PO) Take 125 mg by mouth daily as needed (gas).    [provider]  traZODone  (DESYREL ) 50 MG tablet Take 50 mg by mouth at bedtime.    [provider]    Social History   Socioeconomic History   Marital status: Married    Spouse name: Not on file   Number of children: Not on file   Years of education: Not on file   Highest education level: Not on file  Occupational History   Not on file  Tobacco Use   Smoking status: Never   Smokeless tobacco: Never  Vaping Use   Vaping status: Never Used  Substance and Sexual Activity   Alcohol use: No   Drug use: No   Sexual activity: Yes  Other Topics Concern   Not on file  Social History Narrative   Divorced, remarried.  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   Social Drivers of Corporate investment banker Strain: Not on [employer] Insecurity: No Food Insecurity (12/31/2023)   Hunger Vital Sign    Worried About Running Out of Food in the Last Year: Never true    Ran Out of Food in the Last Year: Never true  Transportation Needs: No Transportation Needs (12/31/2023)   PRAPARE - Administrator, Civil Service (Medical): No    Lack of Transportation (Non-Medical): No  Physical Activity: Not on file  Stress: No Stress Concern Present (09/28/2021)   Received from [facility] of Occupational Health - Occupational Stress Questionnaire    Feeling of Stress : Not at all  Social Connections: Unknown (12/31/2023)   Social Connection and Isolation Panel    Frequency of Communication with Friends and Family: More than three times a week    Frequency of Social Gatherings with Friends and Family: More than three times a week    Attends Religious Services: More than 4 times per year    Active Member of [employer] or Organizations: Patient declined    Attends Banker Meetings: Patient declined    Marital Status: Married  Catering manager Violence: Not At Risk (12/31/2023)   Humiliation, Afraid, Rape, and Kick questionnaire    Fear of Current or Ex-Partner: No    Emotionally Abused: No    Physically Abused: No    Sexually Abused: No     Family History  Problem Relation Age of Onset   CAD Father        s/p triple bypass   Dementia Mother     ROS: [x]  Positive   [ ]  Negative   [ ]  All sytems reviewed and are negative  Cardiac: [x]  hx CABG, AVR (prosthetic)  Vascular: []  pain in legs while walking []  pain in legs at rest []  pain in legs at night []  non-healing ulcers []  hx of DVT []  swelling in legs  Pulmonary: []  asthma/wheezing []  home O2  Neurologic: [x]  hx of CVA []  mini stroke   Hematologic: []  hx of cancer  Endocrine:   []  diabetes [x]  thyroid  disease  GI [x]  GERD with hx of Nissen fundoplication  GU: []  CKD/renal failure []  HD--[]  M/W/F or []  T/T/S  Psychiatric: []  anxiety []  depression  Musculoskeletal: []  arthritis []  joint pain  Integumentary: []   rashes []  ulcers  Constitutional: []  fever  []  chills  Physical Examination  Vitals:   12/31/23 2346 01/01/24 0355  BP: 103/65 100/66  Pulse: (!) 58 68  Resp: 16 16  Temp: 97.6 F (36.4 C) (!) 97.5 F (36.4 C)  SpO2: 96% 95%   Body mass index is 27.21 kg/m.  General:  WDWN in NAD Gait: Not observed HENT: WNL, normocephalic Pulmonary: normal  non-labored breathing Cardiac: regular Abdomen:  soft, NT; aortic pulse is not palpable Skin: without rashes Vascular Exam/Pulses:  Right Left  Radial 2+ (normal) 2+ (normal)  DP 2+ (normal) 2+ (normal)   Extremities: left hand grip is weak otherwise, no deficits Musculoskeletal: no muscle wasting or atrophy  Neurologic: A&O X 3; speech is articulate but slow at times Psychiatric:  The pt has Normal affect.   CBC    Component Value Date/Time   WBC 4.5 01/01/2024 0359   RBC 4.18 (L) 01/01/2024 0359   HGB 12.9 (L) 01/01/2024 0359   HGB 14.6 10/31/2023 1650   HCT 38.3 (L) 01/01/2024 0359   HCT 43.2 10/31/2023 1650   PLT 153 01/01/2024 0359   PLT 176 10/31/2023 1650   MCV 91.6 01/01/2024 0359   MCV 94 10/31/2023 1650   MCH 30.9 01/01/2024 0359   MCHC 33.7 01/01/2024 0359   RDW 13.0 01/01/2024 0359   RDW 12.1 10/31/2023 1650   LYMPHSABS 1.4 12/31/2023 1351   MONOABS 0.5 12/31/2023 1351   EOSABS 0.2 12/31/2023 1351   BASOSABS 0.0 12/31/2023 1351    BMET    Component Value Date/Time   NA 139 01/01/2024 0359   NA 140 10/31/2023 1650   K 4.0 01/01/2024 0359   CL 106 01/01/2024 0359   CO2 24 01/01/2024 0359   GLUCOSE 97 01/01/2024 0359   BUN 16 01/01/2024 0359   BUN 22 10/31/2023 1650   CREATININE 0.97 01/01/2024 0359   CALCIUM  9.1 01/01/2024 0359   GFRNONAA >60 01/01/2024 0359   GFRAA >60 02/03/2017 0527    COAGS: Lab Results  Component Value Date   INR 1.1 12/31/2023   INR 1.46 01/29/2017   INR 0.98 01/24/2017     Non-Invasive Vascular Imaging:   CTA head/neck 01/01/2024 IMPRESSION: 1. Moderate calcific plaque within the carotid bulbs bilaterally, with approximately 50% stenosis on the right and less than 50% stenosis on the left. 2. Moderate calcific atheromatous disease within the cavernous and supraclinoid segments of the internal carotid arteries bilaterally, with estimated stenosis 50%. 3. No evidence of large vessel occlusion. 4. Normal CT  perfusion of the head.  MRI 01/01/2024 IMPRESSION: 1. Acute small focal cortical/subcortical infarct in the right posterior frontal lobe.  ASSESSMENT/PLAN: This is a 83 y.o. male with new right posterior frontal lobe infarct.     -CTA reveals ~ 50% bilateral stenosis (less on the left).  He has left facial droop and hx of left arm weakness.  After speaking with him, his left arm weakness has been present since 2012 and is unchanged.  He has had some speech issues over the past 2-3 months that he states has gotten worse with time.   -will get carotid duplex for more information-this order has been placed.  -he is on daily asa and statin and plavix  have been ordered and started.  -Dr. Magda to evaluate pt and determine further plan   Jordan Apt, PA-C [facility] [phone]  VASCULAR STAFF ADDENDUM: I have independently interviewed and examined the patient. I agree with the above.  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 Checking carotid duplex. Recommend DAPT, high intensity statin. Plan R TCAR in near future unless carotid duplex is highly discordant.  Jordan Hammond. Magda, MD [facility] Phone Number: [phone] 01/01/2024 11:14 AM

## 2024-01-01 NOTE — Progress Notes (Signed)
 Carotid duplex study  has been completed. Refer to [facility] under chart review to view preliminary results.   01/01/2024  11:32 AM Ltanya Bayley, Ricka BIRCH

## 2024-01-01 NOTE — Discharge Summary (Signed)
 Physician Discharge Summary   Patient: Jordan Hammond MRN: [id number] DOB: 09-21-1940  Admit date:     12/31/2023  Discharge date: 01/01/24  Discharge Physician: Lonni SHAUNNA Dalton   PCP: Mercer Garnette Barter, FNP     Recommendations at discharge:  Follow up with Vascular surgery for symptomatic right carotid stenosis for TCAR on 7/30 Follow up with Neurology in 6-8 weeks for acute stroke     Discharge Diagnoses: Principal Problem:   Acute CVA (cerebrovascular accident) [facility]) Active Problems:   Restless leg syndrome   CAD (coronary artery disease), native coronary artery   S/P AVR (aortic valve replacement)   Hyperlipidemia   Status post coronary artery bypass graft   Chronic kidney disease   GERD (gastroesophageal reflux disease)   PUD (peptic ulcer disease)   S/P Nissen fundoplication (without gastrostomy tube) procedure   Hypothyroidism      Hospital Course: 83 y.o. M with CAD s/p CABG, HTN, HLD, AS s/p AVR, CKD and hypothyroidism who presented with acute left facial droop and weakness.  MRI confirmed small right infarct.       Acute ischemic infarct MRI brain showed subcortical infarct right posterior frontal lobe - Non-invasive angiography showed calcific plaque in the bilateral carotid bulbs, worse on the right. - Echocardiogram 6 weeks ago was unremarkable - Carotid imaging confirmed 40 to 59% stenosis right carotid - Lipids ordered: LDL 51, continue statin - Aspirin  and Plavix  for 3 weeks followed by aspirin  alone (unless Plavix  indicated longer post-TCAR) - Evaluation for arrhythmia/atrial fibrillation: Cardiac event monitor ordered - tPA not given because outside window - Dysphagia screen ordered in ER - PT eval ordered: recommended outpatient PT/OT - Nonsmoker             The [place]  Controlled Substances Registry was reviewed for this patient prior to discharge.   Consultants: Neurology   Disposition: Home Diet  recommendation:  Discharge Diet Orders (From admission, onward)     Start     Ordered   01/01/24 0000  Diet - low sodium heart healthy        01/01/24 1602             DISCHARGE MEDICATION: Allergies as of 01/01/2024       Reactions   Pravachol [pravastatin] Nausea And Vomiting   Actiq  [fentanyl ] Other (See Comments)   Unknown reaction   Ak-mycin [erythromycin] Nausea And Vomiting   Bayer Aspirin  [aspirin ] Nausea And Vomiting   High doses only - OK with low dose QD   Motrin [ibuprofen] Nausea And Vomiting   Nsaids Nausea And Vomiting   Relafen [nabumetone] Nausea And Vomiting   Sulfa Antibiotics Other (See Comments)   Unknown reaction   Sumycin [tetracycline] Nausea And Vomiting   Elemental Sulfur Itching, Swelling, Rash   Sulfamethoxazole Itching, Swelling, Rash        Medication List     TAKE these medications    acetaminophen  325 MG tablet Commonly known as: TYLENOL  Take 650 mg by mouth in the morning.   aspirin  EC 81 MG tablet Take 1 tablet (81 mg total) by mouth daily.   atorvastatin  20 MG tablet Commonly known as: LIPITOR Take 1 tablet (20 mg total) by mouth daily. What changed: when to take this   CALCIUM  + VITAMIN D3 PO Take 1 tablet by mouth daily.   clonazePAM  1 MG tablet Commonly known as: KLONOPIN  Take 1 mg by mouth at bedtime.   clopidogrel  75 MG tablet Commonly known as: PLAVIX  Take  1 tablet (75 mg total) by mouth daily. Start taking on: January 02, 2024   CO Q 10 PO Take 1 capsule by mouth daily.   docusate sodium  100 MG capsule Commonly known as: COLACE Take 100-200 mg by mouth See admin instructions. Take 2 capsules (200mg ) by mouth every morning and take 1-2 capsules (100-200mg ) at night as needed for constipation.   famotidine  20 MG tablet Commonly known as: PEPCID  Take 20 mg by mouth daily as needed for heartburn or indigestion.   FLAX SEED OIL PO Take 2 capsules by mouth daily.   GAS FREE EXTRA STRENGTH PO Take 1-2  tablets by mouth daily as needed (gas).   Leg Cramps Tabs Take 1 tablet by mouth at bedtime. Magnesium    levothyroxine  50 MCG tablet Commonly known as: SYNTHROID  Take 50 mcg by mouth daily before breakfast.   nitroGLYCERIN  0.4 MG SL tablet Commonly known as: NITROSTAT  Place 1 tablet (0.4 mg total) under the tongue every 5 (five) minutes as needed for chest pain. Do not take nitroglycerine within 48 hours of taking viagra   OVER THE COUNTER MEDICATION Place 1-2 sprays into the nose daily as needed (allergies). OTC allergy nasal spray   traZODone  50 MG tablet Commonly known as: DESYREL  Take 50 mg by mouth at bedtime.   VITAMIN B-12 PO Take 1 tablet by mouth daily.   VITAMIN C PO Take 1 tablet by mouth daily.   VITAMIN D -3 PO Take 1 capsule by mouth daily.        Follow-up Information      [facility]. Schedule an appointment as soon as possible for a visit in 1 month(s).   Specialty: Neurology Why: stroke clinic Contact information: [address] [place]  [address] [phone]        Mercer Garnette Barter, FNP. Schedule an appointment as soon as possible for a visit.   Specialties: Family Medicine, General Practice Contact information: [address] [place] [address] [phone]         Magda Debby SAILOR, MD Follow up.   Specialties: Vascular Surgery, Interventional Cardiology Contact information: [address] [place] [address] [phone]         [facility]. Schedule an appointment as soon as possible for a visit.   Specialty: Rehabilitation Contact information: [address] [place]  [address] [phone]                Discharge Instructions     Ambulatory referral to Neurology   Complete by: As directed    Follow up with stroke clinic NP at [facility] in about 4-6 weeks. Thanks.    Ambulatory referral to Occupational Therapy   Complete by: As directed    Ambulatory referral to Physical Therapy   Complete by: As directed    Ambulatory referral to Speech Therapy   Complete by: As directed    Diet - low sodium heart healthy   Complete by: As directed    Discharge instructions   Complete by: As directed    **IMPORTANT DISCHARGE INSTRUCTIONS**   From Dr. Jonel: You were admitted for a small right sided stroke.  We found that this was from plaque build up in your right carotid artery  You met with Dr. Magda from Vascular Surgery   He plans for TCAR treatment on July 30  Please follow all instructions from his office Call them (see number below in the To  Do section) if you have any questions  Go see the Neurology office ([facility]) in 6-8 weeks for follow up  Continue your aspirin  and atorvastatin  START clopidogrel /Plavix  75 mg daily   This is an aspirin  booster, to prevent platelets from clumping and causing strokes After the TCAR procedure, discuss with Dr. Magda how long you should continue aspirin  and Plavix    Increase activity slowly   Complete by: As directed        Discharge Exam: Filed Weights   12/31/23 1352 12/31/23 1412  Weight: 91.8 kg 91 kg    General: Pt is alert, awake, not in acute distress Cardiovascular: RRR, nl S1-S2, no murmurs appreciated.   No LE edema.   Respiratory: Normal respiratory rate and rhythm.  CTAB without rales or wheezes. Abdominal: Abdomen soft and non-tender.  No distension or HSM.   Neuro/Psych: Strength symmetric in upper and lower extremities.  Judgment and insight appear normal.   Condition at discharge: good  The results of significant diagnostics from this hospitalization (including imaging, microbiology, ancillary and laboratory) are listed below for reference.   Imaging Studies: VAS US  CAROTID Result Date: 01/01/2024 Carotid Arterial Duplex Study Patient Name:  JAYSION RAMSEYER [facility]  Date of  Exam:   01/01/2024 Medical Rec #: [id number]       Accession #:    [id number] Date of Birth: 06-Apr-1941       Patient Gender: M Patient Age:   30 years Exam Location:  [facility] Procedure:      VAS US  CAROTID Referring Phys: LUCIE RHYNE --------------------------------------------------------------------------------  Indications:       CVA, Carotid artery disease and left sided weakness, word                    finding difficulity. Risk Factors:      Hypertension, hyperlipidemia, coronary artery disease. Other Factors:     Status post CABG and Aortic Valve Replacement in 2018.                    CKD, GERD. Comparison Study:  No prior carotid duplex study.                    12/31/23 CTA showed moderate calcific plaque bilaterally with                    50% right bulb/ICA and less than 50% left ICA. Performing Technologist: Ricka Sturdivant-Jones RDMS, RVT  Examination Guidelines: A complete evaluation includes B-mode imaging, spectral Doppler, color Doppler, and power Doppler as needed of all accessible portions of each vessel. Bilateral testing is considered an integral part of a complete examination. Limited examinations for reoccurring indications may be performed as noted.  Right Carotid Findings: +----------+--------+--------+--------+------------------+--------+           PSV cm/sEDV cm/sStenosisPlaque DescriptionComments +----------+--------+--------+--------+------------------+--------+ CCA Prox  114     28                                         +----------+--------+--------+--------+------------------+--------+ CCA Distal83      21                                         +----------+--------+--------+--------+------------------+--------+ ICA Prox  155     42  40-59%  heterogenous               +----------+--------+--------+--------+------------------+--------+ ICA Mid   150     37      40-59%  heterogenous                +----------+--------+--------+--------+------------------+--------+ ICA Distal90      28                                         +----------+--------+--------+--------+------------------+--------+ ECA       158     18                                         +----------+--------+--------+--------+------------------+--------+ +----------+--------+-------+----------------+-------------------+           PSV cm/sEDV cmsDescribe        Arm Pressure (mmHG) +----------+--------+-------+----------------+-------------------+ [id number]            Multiphasic, WNL                    +----------+--------+-------+----------------+-------------------+ +---------+--------+--+--------+--+---------+ VertebralPSV cm/s56EDV cm/s19Antegrade +---------+--------+--+--------+--+---------+  Left Carotid Findings: +----------+--------+--------+--------+------------------+------------------+           PSV cm/sEDV cm/sStenosisPlaque DescriptionComments           +----------+--------+--------+--------+------------------+------------------+ CCA Prox  98      22                                                   +----------+--------+--------+--------+------------------+------------------+ CCA Distal103     24                                intimal thickening +----------+--------+--------+--------+------------------+------------------+ ICA Prox  93      29      1-39%   calcific                             +----------+--------+--------+--------+------------------+------------------+ ICA Mid   102     37                                                   +----------+--------+--------+--------+------------------+------------------+ ICA Distal107     29                                                   +----------+--------+--------+--------+------------------+------------------+ ECA       118     19                                                    +----------+--------+--------+--------+------------------+------------------+ +----------+--------+--------+----------------+-------------------+           PSV cm/sEDV cm/sDescribe  Arm Pressure (mmHG) +----------+--------+--------+----------------+-------------------+ [id number]             Multiphasic, WNL                    +----------+--------+--------+----------------+-------------------+ +---------+--------+--+--------+--+---------+ VertebralPSV cm/s42EDV cm/s14Antegrade +---------+--------+--+--------+--+---------+   Summary: Right Carotid: Velocities in the right ICA are consistent with a 40-59%                stenosis. Left Carotid: Velocities in the left ICA are consistent with a 1-39% stenosis. Vertebrals:  Bilateral vertebral arteries demonstrate antegrade flow. Subclavians: Normal flow hemodynamics were seen in bilateral subclavian              arteries. *See table(s) above for measurements and observations.  Electronically signed by Debby Robertson on 01/01/2024 at 12:45:57 PM.    Final    MR BRAIN WO CONTRAST Result Date: 12/31/2023 CLINICAL DATA:  Stroke follow-up.  Facial drooping. EXAM: MRI HEAD WITHOUT CONTRAST TECHNIQUE: Multiplanar, multiecho pulse sequences of the brain and surrounding structures were obtained without intravenous contrast. COMPARISON:  CT angiogram of the head and neck and is CT the head dated December 31, 2023. FINDINGS: Brain: There is a small streaky band of restricted diffusion within the cortex and sub cortical white matter of the right posterior frontal lobe, compatible with acute nonhemorrhagic focal cortical/subcortical infarct. There is age related cerebral volume loss and minimal periventricular white matter disease. There is no evidence of hemorrhage, mass or hydrocephalus. Vascular: Normal vascular flow voids. Skull and upper cervical spine: Normal marrow signal. No bony lesions. Sinuses/Orbits: Mucosal disease within the ethmoid and sphenoid  sinuses. Status post bilateral lens replacement. Other: None. IMPRESSION: 1. Acute small focal cortical/subcortical infarct in the right posterior frontal lobe. Electronically Signed   By: Evalene Coho M.D.   On: 12/31/2023 16:49   CT ANGIO HEAD NECK W WO CM W PERF (CODE STROKE) Result Date: 12/31/2023 CLINICAL DATA:  Neuro deficit, acute, stroke suspected EXAM: CT ANGIOGRAPHY OF THE HEAD AND NECK CT PERFUSION BRAIN TECHNIQUE: Multidetector CT imaging of the head and neck was performed using the standard protocol during bolus administration of intravenous contrast. Multiplanar CT image reconstructions and MIPs were obtained to evaluate the vascular anatomy. Carotid stenosis measurements (when applicable) are obtained utilizing NASCET criteria, using the distal internal carotid diameter as the denominator. Multiphase CT imaging of the brain was performed following IV bolus contrast injection. Subsequent parametric perfusion maps were calculated using RAPID software. RADIATION DOSE REDUCTION: This exam was performed according to the departmental dose-optimization program which includes automated exposure control, adjustment of the mA and/or kV according to patient size and/or use of iterative reconstruction technique. CONTRAST:  OMNIPAQUE  IOHEXOL  350 MG/ML SOLN COMPARISON:  CT the head dated December 31, 2023. FINDINGS: CTA NECK Aortic arch: Normal caliber. Mild calcific atheromatous disease. Standard three-vessel origin of the great arteries. The brachiocephalic artery is widely patent. Right carotid system: There is moderate calcific plaque within the right carotid bulb and origin of the internal carotid artery, with approximately 50% luminal stenosis. The remainder of the cervical segment of the internal carotid arteries normal in caliber. Left carotid system: There is moderate calcific plaque within the left carotid bulb with less than 50% luminal stenosis. The remainder of the cervical segment of the  internal carotid arteries normal in caliber. Vertebral arteries:The vertebral arteries are codominant and normal in caliber throughout the respective courses. Skeleton: Negative. Other neck: Negative. CTA HEAD Anterior circulation: There is mild to moderate calcific atheromatous  disease within the carotid siphons and supraclinoid segments of the internal carotid arteries bilaterally, with moderate stenosis, approximately 50% bilaterally. The anterior and middle cerebral arteries and their branches appear normal in caliber. There is no evidence of large vessel occlusion, flow-limiting stenosis or aneurysm. Posterior circulation: The vertebrobasilar system is unremarkable. The posterior cerebral arteries and the cerebellar arteries are normal in caliber. Venous sinuses: Patent. Anatomic variants: None. Delayed phase: Unremarkable. CT Brain Perfusion Findings: ASPECTS: 10. CBF (<30%) Volume: 0mL Perfusion (Tmax>6.0s) volume: 0mL Mismatch Volume: 0mL Infarction Location:Not applicable. IMPRESSION: 1. Moderate calcific plaque within the carotid bulbs bilaterally, with approximately 50% stenosis on the right and less than 50% stenosis on the left. 2. Moderate calcific atheromatous disease within the cavernous and supraclinoid segments of the internal carotid arteries bilaterally, with estimated stenosis 50%. 3. No evidence of large vessel occlusion. 4. Normal CT perfusion of the head. These results were called by telephone at the time of interpretation on 12/31/2023 at 2:18 pm to provider Dr. CAMELLIA SHARK , who verbally acknowledged these results. Electronically Signed   By: Evalene Coho M.D.   On: 12/31/2023 14:26   CT HEAD CODE STROKE WO CONTRAST Result Date: 12/31/2023 CLINICAL DATA:  Code stroke.  Left facial droop. EXAM: CT HEAD WITHOUT CONTRAST TECHNIQUE: Contiguous axial images were obtained from the base of the skull through the vertex without intravenous contrast. RADIATION DOSE REDUCTION: This exam was  performed according to the departmental dose-optimization program which includes automated exposure control, adjustment of the mA and/or kV according to patient size and/or use of iterative reconstruction technique. COMPARISON:  None Available. FINDINGS: Brain: Age-related atrophy. Mild cerebral white matter disease. No evidence of hemorrhage, mass, cortical infarct or hydrocephalus. Vascular: Negative. Skull: Intact and unremarkable. Sinuses/Orbits: Mild mucosal disease within the ethmoid air cells. Status post bilateral lens replacement. Other: None. IMPRESSION: 1. Mild small vessel disease. 2. ASPECTS is 10. 3. These results were communicated to Dr. Lindzen at 1:58 pm on 12/31/2023 by text page via the [facility] messaging system. Electronically Signed   By: Evalene Coho M.D.   On: 12/31/2023 14:00    Microbiology: Results for orders placed or performed during the hospital encounter of 01/19/17  Surgical pcr screen     Status: None   Collection Time: 01/23/17 12:37 PM   Specimen: Nasal Mucosa; Nasal Swab  Result Value Ref Range Status   MRSA, PCR NEGATIVE NEGATIVE Final   Staphylococcus aureus NEGATIVE NEGATIVE Final    Comment:        The Xpert SA Assay (FDA approved for NASAL specimens in patients over 29 years of age), is one component of a comprehensive surveillance program.  Test performance has been validated by [facility] for patients greater than or equal to 63 year old. It is not intended to diagnose infection nor to guide or monitor treatment.     Labs: CBC: Recent Labs  Lab 12/31/23 1351 12/31/23 1353 01/01/24 0359  WBC 4.0  --  4.5  NEUTROABS 1.9  --   --   HGB 12.7* 12.6* 12.9*  HCT 37.9* 37.0* 38.3*  MCV 93.6  --  91.6  PLT 153  --  153   Basic Metabolic Panel: Recent Labs  Lab 12/31/23 1351 12/31/23 1353 01/01/24 0359  NA 139 141 139  K 3.9 4.0 4.0  CL 106 105 106  CO2 23  --  24  GLUCOSE 99 92 97  BUN 20 23 16   CREATININE 1.33* 1.40* 0.97   CALCIUM  9.0  --  9.1   Liver Function Tests: Recent Labs  Lab 12/31/23 1351 01/01/24 0359  AST 32 28  ALT 40 38  ALKPHOS 45 42  BILITOT 1.0 1.0  PROT 6.0* 5.9*  ALBUMIN  3.6 3.5   CBG: Recent Labs  Lab 12/31/23 1349  GLUCAP 100*    Discharge time spent: approximately 45 minutes spent on discharge counseling, evaluation of patient on day of discharge, and coordination of discharge planning with nursing, social work, pharmacy and case management  Signed: Lonni SHAUNNA Dalton, MD [facility] 01/01/2024

## 2024-01-01 NOTE — Progress Notes (Signed)
 SLP Cancellation Note  Patient Details Name: Jordan Hammond MRN: [id number] DOB: 10-18-1940   Cancelled treatment:        Attempted to see pt for cognitive-linguistic assessment.  Spoke with RN.  Pt is transporting for vascular procedure imminently. SLP will reattempt as schedule permits.    Anette FORBES Grippe, MA, CCC-SLP Acute Rehabilitation Services Office: [phone] 01/01/2024, 10:55 AM

## 2024-01-02 ENCOUNTER — Other Ambulatory Visit: Payer: Self-pay | Admitting: Student

## 2024-01-02 DIAGNOSIS — I639 Cerebral infarction, unspecified: Secondary | ICD-10-CM

## 2024-01-02 NOTE — Progress Notes (Signed)
   Ordered 30 day Event Monitor for further evaluation of acute CVA at the request of Dr. Jonel (Internal Medicine).   Tisa Weisel E Calli Bashor, PA-C 01/02/2024 7:12 AM

## 2024-01-03 NOTE — Telephone Encounter (Signed)
 Called and spoke with patient- not the wife. He said he is not needing to see Prentice at this time. He needs to be seen after his heart cath on 7/30. Patient was amendable to be scheduled with Nathanel on 01/16/24 at 150 told to be there at 130. Advised she was in [address]. Nikki D. Witnessed the conversation.

## 2024-01-06 ENCOUNTER — Other Ambulatory Visit: Payer: Self-pay

## 2024-01-06 DIAGNOSIS — I639 Cerebral infarction, unspecified: Secondary | ICD-10-CM

## 2024-01-07 ENCOUNTER — Encounter (HOSPITAL_COMMUNITY): Payer: Self-pay | Admitting: Vascular Surgery

## 2024-01-07 ENCOUNTER — Other Ambulatory Visit: Payer: Self-pay

## 2024-01-07 NOTE — Progress Notes (Signed)
 Anesthesia Chart Review: SAME DAY WORK-UP  Case: [id number] Date/Time: 01/08/24 1044   Procedure: TRANSCAROTID ARTERY REVASCULARIZATION (TCAR) (Right)   Anesthesia type: General   Diagnosis: Acute ischemic stroke (HCC) [I63.9]   Pre-op  diagnosis: acute cva   [facility] / [facility]   Surgeons: Magda Debby SAILOR, MD       DISCUSSION: Patient is an 83 year old male scheduled for the above procedure.  Recent overnight admission 12/31/2023 for acute small right posterior frontal lobe infarct.  tPA not given because of being outside window parameters.  Recent echocardiogram in May 2025 was unremarkable.  Carotid imaging confirmed 40 to 59% right ICA stenosis (~ 50% by CTA) with < 50% on the left.  Neurology and vascular surgery consulted.  Outpatient right TCAR planned.  Continue statin.  Aspirin  and Plavix  for 3 weeks then aspirin  alone unless indicated longer post TCAR.  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  He had recent cardiology follow-up and testing.  Monitor in December 2023 showed sinus rhythm with occasional PAC, brief PAT and rare PVC.  Echocardiogram repeated in May 2025 and showed normal LV function, prior aortic valve replacement with mean gradient 6 mmHg, no aortic insufficiency and trace mitral regurgitation.  Stress PET May 2025 showed ejection fraction 55% and normal perfusion. He ultimately underwent a LHC on 5/28/205 for continued complaints of dyspnea and chest pain. Results showed patent grafts to LAD and OM1.  Consider other causes of chest pain.  He has a pending 30-day monitor due to recent CVA.  Anesthesia team to evaluate on the day of surgery. He is to continue aspirin  and Plavix .   VS:  Wt Readings from Last 3 Encounters:   12/31/23 91 kg  11/06/23 87.5 kg  10/31/23 89.1 kg   BP Readings from Last 3 Encounters:  01/01/24 (!) 110/55  11/06/23 111/65  10/31/23 122/66   Pulse Readings from Last 3 Encounters:  01/01/24 66  11/06/23 69  10/31/23 80    PROVIDERS: Mercer Garnette Barter, FNP is PCP  Pietro Rogue, MD is cardiologist    LABS: Most recent lab results include: Lab Results  Component Value Date   WBC 4.5 01/01/2024   HGB 12.9 (L) 01/01/2024   HCT 38.3 (L) 01/01/2024   PLT 153 01/01/2024   GLUCOSE 97 01/01/2024   CHOL 107 01/01/2024   TRIG 66 01/01/2024   HDL 43 01/01/2024   LDLCALC 51 01/01/2024   ALT 38 01/01/2024   AST 28 01/01/2024   NA 139 01/01/2024   K 4.0 01/01/2024   CL 106 01/01/2024   CREATININE 0.97 01/01/2024   BUN 16 01/01/2024   CO2 24 01/01/2024   TSH 2.760 09/26/2023   INR 1.1 12/31/2023   HGBA1C 5.3 01/01/2024    IMAGES: MRI brain 12/31/2023: IMPRESSION: 1. Acute small focal cortical/subcortical infarct in the right posterior frontal lobe.  CT Perfusion Brain & CTA Head/Neck 12/31/2023: IMPRESSION: 1. Moderate calcific plaque within the carotid bulbs bilaterally, with approximately 50% stenosis on the right and less than 50% stenosis on the left. 2. Moderate calcific atheromatous disease within the cavernous and supraclinoid segments of the internal carotid arteries bilaterally, with estimated stenosis 50%. 3. No evidence of large vessel occlusion. 4. Normal CT perfusion of the head.  CT Chest (over read PET CT perfusion)  10/29/2023: IMPRESSION: 1. No acute CT findings of the included chest. 2. Coronary artery disease. 3. Aortic valve prosthesis. Aortic Atherosclerosis (ICD10-I70.0).   EKG: EKG 12/31/2023: Sinus rhythm Low voltage, precordial leads no sig change from previous Confirmed by Armenta Canning [phone]) on 01/01/2024 5:13:03 PM   CV: He has a pending 30-day event monitor for evaluation of CVA (ordered 01/02/24).   US  Carotid  01/01/2024: Summary: - Right carotid: Velocities in the right ICA are consistent with a 40-59% stenosis. - Left Carotid: Velocities in the left ICA are consistent with a 1-39% stenosis.  - Vertebrals: Bilateral vertebral arteries demonstrate antegrade flow.  - Subclavians: Normal flow hemodynamics were seen in bilateral subclavian arteries.    Cardiac cath 11/06/2023: 2 vessel obstructive CAD Patent LIMA to the LAD Patent SVG to OM1 Plan: consider other causes for chest pain    NM PET CT cardiac perfusion study 10/29/2023:   The study is normal. The study is low risk.   LV perfusion is normal.   Rest left ventricular function is normal. Rest EF: 55%. Stress left ventricular function is normal. Stress EF: 59%. End diastolic cavity size is normal. End systolic cavity size is normal.   Myocardial blood flow was computed to be 0.68ml/g/min at rest and 1.69ml/g/min at stress. Global myocardial blood flow reserve was 2.13 and was normal.   Coronary calcium  assessment not performed due to prior revascularization.   TTE 10/28/2023: IMPRESSIONS   1. Left ventricular ejection fraction, by estimation, is 60 to 65%. The  left ventricle has normal function. The left ventricle has no regional  wall motion abnormalities. The average left ventricular global  longitudinal strain is -20.9 %. The global  longitudinal strain is normal.   2. Right ventricular systolic function is normal. The right ventricular  size is normal.   3. Trivial mitral valve regurgitation.   4. S/p AVR (25 mm Magna Ease pericardial valve; procedure date 01/29/17)  Peak and mean gradients through the valve are 12 and 6 mm Hg respectively.  . The aortic valve is tricuspid. Aortic valve regurgitation is not  visualized.  - Comparison(s): The left ventricular function is unchanged.   Past Medical History:  Diagnosis Date   Aortic atherosclerosis (HCC) 01/20/2017   Aortic stenosis 01/20/2017   Arthritis    CAD (coronary artery  disease), native coronary artery 01/20/2017   GERD (gastroesophageal reflux disease)    Hiatal hernia    History of kidney stones    Hyperlipidemia    Hypothyroidism    Restless leg syndrome    Stroke [facility])     Past Surgical History:  Procedure Laterality Date   AORTIC VALVE REPLACEMENT N/A 01/29/2017   Procedure: AORTIC VALVE REPLACEMENT (AVR);  Surgeon: Fleeta Ochoa, Maude, MD;  Location: [facility] OR;  Service: Open Heart Surgery;  Laterality: N/A;   APPENDECTOMY     BLEPHAROPLASTY Bilateral    bowel obstruction surgery     CATARACT EXTRACTION Bilateral 2018   CHOLECYSTECTOMY     CORONARY ARTERY BYPASS GRAFT N/A 01/29/2017   Procedure: CORONARY ARTERY BYPASS GRAFTING (CABG)  x two, using left internal mammary artery and right leg greater saphenous vein harvested endoscopically;  Surgeon: Fleeta Ochoa Maude, MD;  Location: [facility] OR;  Service: Open Heart Surgery;  Laterality: N/A;   KNEE ARTHROSCOPY     lap nissan     LEFT HEART CATH AND CORONARY ANGIOGRAPHY N/A 01/21/2017   Procedure: LEFT HEART CATH AND CORONARY ANGIOGRAPHY;  Surgeon: Court Dorn PARAS, MD;  [facility]VAS[facility] CV [facility];  Service: Cardiovascular;  Laterality: N/A;   LEFT HEART CATH AND CORS/GRAFTS ANGIOGRAPHY N/A 11/06/2023   Procedure: LEFT HEART CATH AND CORS/GRAFTS ANGIOGRAPHY;  Surgeon: [place], Peter M, MD;  Location: [facility] INVASIVE CV LAB;  Service: Cardiovascular;  Laterality: N/A;   RIGHT HEART CATH N/A 01/21/2017   Procedure: RIGHT HEART CATH;  Surgeon: Court Dorn PARAS, MD;  Location: [facility] INVASIVE CV LAB;  Service: Cardiovascular;  Laterality: N/A;   SHOULDER SURGERY     TEE WITHOUT CARDIOVERSION N/A 01/29/2017   Procedure: TRANSESOPHAGEAL ECHOCARDIOGRAM (TEE);  Surgeon: Fleeta Ochoa, Maude, MD;  Location: [facility] OR;  Service: Open Heart Surgery;  Laterality: N/A;    MEDICATIONS: No current facility-administered medications for this encounter.    acetaminophen  (TYLENOL ) 325 MG tablet   Ascorbic Acid (VITAMIN C PO)   aspirin   EC 81 MG EC tablet   atorvastatin  (LIPITOR) 20 MG tablet   Calcium  Carb-Cholecalciferol (CALCIUM  + VITAMIN D3 PO)   Cholecalciferol (VITAMIN D -3 PO)   clonazePAM  (KLONOPIN ) 1 MG tablet   clopidogrel  (PLAVIX ) 75 MG tablet   Coenzyme Q10 (CO Q 10 PO)   Cyanocobalamin  (VITAMIN B-12 PO)   docusate sodium  (COLACE) 100 MG capsule   famotidine  (PEPCID ) 20 MG tablet   Flaxseed, Linseed, (FLAX SEED OIL PO)   Homeopathic Products (LEG CRAMPS) TABS   levothyroxine  (SYNTHROID ) 50 MCG tablet   nitroGLYCERIN  (NITROSTAT ) 0.4 MG SL tablet   OVER THE COUNTER MEDICATION   Simethicone  (GAS FREE EXTRA STRENGTH PO)   traZODone  (DESYREL ) 50 MG tablet    Isaiah Ruder, PA-C Surgical Short Stay/Anesthesiology [facility] Phone [phone] [facility] Phone [phone] 01/07/2024 3:22 PM

## 2024-01-07 NOTE — Progress Notes (Signed)
 Message left to pt's voicemail to arrive tom at 0830. NPO post mn.

## 2024-01-07 NOTE — Anesthesia Preprocedure Evaluation (Signed)
 Anesthesia Evaluation  Patient identified by MRN, date of birth, ID band Patient awake    Reviewed: Allergy & Precautions, H&P , NPO status , Patient's Chart, lab work & pertinent test results  Airway Mallampati: II   Neck ROM: full    Dental   Pulmonary neg pulmonary ROS   breath sounds clear to auscultation       Cardiovascular + CAD, + CABG and + Peripheral Vascular Disease  + Valvular Problems/Murmurs  Rhythm:regular Rate:Normal  S/p AVR and CABG  TTE (10/28/23): EF 60-65%, bioprosthetic AV functioning well.  Mean PG 6 mmHg across the AV.   Neuro/Psych  Neuromuscular disease CVA    GI/Hepatic hiatal hernia, PUD,GERD  ,,  Endo/Other  Hypothyroidism    Renal/GU      Musculoskeletal  (+) Arthritis ,    Abdominal   Peds  Hematology   Anesthesia Other Findings   Reproductive/Obstetrics                              Anesthesia Physical Anesthesia Plan  ASA: 3  Anesthesia Plan: General   Post-op Pain Management:    Induction: Intravenous  PONV Risk Score and Plan: 2 and Ondansetron , Dexamethasone  and Treatment may vary due to age or medical condition  Airway Management Planned: Oral ETT  Additional Equipment:   Intra-op Plan:   Post-operative Plan: Extubation in OR  Informed Consent: I have reviewed the patients History and Physical, chart, labs and discussed the procedure including the risks, benefits and alternatives for the proposed anesthesia with the patient or authorized representative who has indicated his/her understanding and acceptance.     Dental advisory given  Plan Discussed with: CRNA, Anesthesiologist and Surgeon  Anesthesia Plan Comments: (PAT note written 01/07/2024 by Allison Zelenak, PA-C.  )         Anesthesia Quick Evaluation

## 2024-01-07 NOTE — Progress Notes (Signed)
 SDW CALL  Patient was given pre-op  instructions over the phone. The opportunity was given for the patient to ask questions. No further questions asked. Patient verbalized understanding of instructions given.   PCP - Prentice Single Cardiologist - Dr. Redell Shallow  PPM/ICD - denies    Chest x-ray -  EKG - 12/31/23 Stress Test -  ECHO - 10/28/23 Cardiac Cath - 11/06/23  Sleep Study - denies  No DM  Last dose of GLP1 agonist-  n/a GLP1 instructions:  n/a  Blood Thinner Instructions: patient instructed to continue taking Plavix  Aspirin  Instructions: patient instructed to continue taking Aspirin   ERAS Protcol - NPO   COVID TEST- n/a   Anesthesia review: yes - CAD  Patient denies shortness of breath, fever, cough and chest pain over the phone call   All instructions explained to the patient, with a verbal understanding of the material. Patient agrees to go over the instructions while at home for a better understanding.

## 2024-01-08 ENCOUNTER — Ambulatory Visit: Admitting: Cardiology

## 2024-01-08 ENCOUNTER — Inpatient Hospital Stay (HOSPITAL_COMMUNITY)

## 2024-01-08 ENCOUNTER — Encounter (HOSPITAL_COMMUNITY): Payer: Self-pay | Admitting: Vascular Surgery

## 2024-01-08 ENCOUNTER — Inpatient Hospital Stay (HOSPITAL_COMMUNITY)
Admission: RE | Admit: 2024-01-08 | Discharge: 2024-01-10 | DRG: 036 | Disposition: A | Discharge status: Home Health | Attending: Vascular Surgery | Admitting: Vascular Surgery

## 2024-01-08 ENCOUNTER — Inpatient Hospital Stay (HOSPITAL_COMMUNITY): Payer: Self-pay | Admitting: Vascular Surgery

## 2024-01-08 ENCOUNTER — Other Ambulatory Visit: Payer: Self-pay

## 2024-01-08 ENCOUNTER — Encounter (HOSPITAL_COMMUNITY)
Admission: RE | Disposition: A | Discharge status: Home Health | Payer: Self-pay | Source: Home / Self Care | Attending: Vascular Surgery

## 2024-01-08 DIAGNOSIS — I6521 Occlusion and stenosis of right carotid artery: Secondary | ICD-10-CM

## 2024-01-08 DIAGNOSIS — I6529 Occlusion and stenosis of unspecified carotid artery: Principal | ICD-10-CM | POA: Diagnosis present

## 2024-01-08 DIAGNOSIS — Z8249 Family history of ischemic heart disease and other diseases of the circulatory system: Secondary | ICD-10-CM | POA: Diagnosis not present

## 2024-01-08 DIAGNOSIS — E039 Hypothyroidism, unspecified: Secondary | ICD-10-CM

## 2024-01-08 DIAGNOSIS — I2510 Atherosclerotic heart disease of native coronary artery without angina pectoris: Secondary | ICD-10-CM | POA: Diagnosis not present

## 2024-01-08 DIAGNOSIS — R001 Bradycardia, unspecified: Secondary | ICD-10-CM | POA: Diagnosis not present

## 2024-01-08 DIAGNOSIS — Z886 Allergy status to analgesic agent status: Secondary | ICD-10-CM | POA: Diagnosis not present

## 2024-01-08 DIAGNOSIS — Z79899 Other long term (current) drug therapy: Secondary | ICD-10-CM | POA: Diagnosis not present

## 2024-01-08 DIAGNOSIS — Z885 Allergy status to narcotic agent status: Secondary | ICD-10-CM

## 2024-01-08 DIAGNOSIS — I9581 Postprocedural hypotension: Secondary | ICD-10-CM | POA: Diagnosis not present

## 2024-01-08 DIAGNOSIS — Z7902 Long term (current) use of antithrombotics/antiplatelets: Secondary | ICD-10-CM | POA: Diagnosis not present

## 2024-01-08 DIAGNOSIS — Z8673 Personal history of transient ischemic attack (TIA), and cerebral infarction without residual deficits: Secondary | ICD-10-CM | POA: Diagnosis not present

## 2024-01-08 DIAGNOSIS — Z006 Encounter for examination for normal comparison and control in clinical research program: Secondary | ICD-10-CM | POA: Diagnosis not present

## 2024-01-08 DIAGNOSIS — Z79890 Hormone replacement therapy (postmenopausal): Secondary | ICD-10-CM | POA: Diagnosis not present

## 2024-01-08 DIAGNOSIS — Z888 Allergy status to other drugs, medicaments and biological substances status: Secondary | ICD-10-CM

## 2024-01-08 DIAGNOSIS — Z952 Presence of prosthetic heart valve: Secondary | ICD-10-CM

## 2024-01-08 DIAGNOSIS — Z951 Presence of aortocoronary bypass graft: Secondary | ICD-10-CM

## 2024-01-08 DIAGNOSIS — K219 Gastro-esophageal reflux disease without esophagitis: Secondary | ICD-10-CM | POA: Diagnosis present

## 2024-01-08 DIAGNOSIS — Z7982 Long term (current) use of aspirin: Secondary | ICD-10-CM

## 2024-01-08 DIAGNOSIS — E785 Hyperlipidemia, unspecified: Secondary | ICD-10-CM | POA: Diagnosis not present

## 2024-01-08 DIAGNOSIS — I639 Cerebral infarction, unspecified: Secondary | ICD-10-CM | POA: Diagnosis present

## 2024-01-08 HISTORY — DX: Unspecified osteoarthritis, unspecified site: M19.90

## 2024-01-08 HISTORY — DX: Cerebral infarction, unspecified: I63.9

## 2024-01-08 HISTORY — DX: Personal history of urinary calculi: Z87.442

## 2024-01-08 HISTORY — DX: Hypothyroidism, unspecified: E03.9

## 2024-01-08 LAB — URINALYSIS, ROUTINE W REFLEX MICROSCOPIC
Bilirubin Urine: NEGATIVE
Glucose, UA: NEGATIVE mg/dL
Hgb urine dipstick: NEGATIVE
Ketones, ur: NEGATIVE mg/dL
Leukocytes,Ua: NEGATIVE
Nitrite: NEGATIVE
Protein, ur: NEGATIVE mg/dL
Specific Gravity, Urine: 1.017 (ref 1.005–1.030)
pH: 5 (ref 5.0–8.0)

## 2024-01-08 LAB — TYPE AND SCREEN
ABO/RH(D): B POS
Antibody Screen: NEGATIVE

## 2024-01-08 LAB — SURGICAL PCR SCREEN
MRSA, PCR: NEGATIVE
Staphylococcus aureus: NEGATIVE

## 2024-01-08 SURGERY — TRANSCAROTID ARTERY REVASCULARIZATION (TCAR)
Anesthesia: General | Laterality: Right

## 2024-01-08 MED ORDER — 0.9 % SODIUM CHLORIDE (POUR BTL) OPTIME
TOPICAL | Status: DC | PRN
  Administered 2024-01-08: 1000 mL

## 2024-01-08 MED ORDER — TRAZODONE HCL 50 MG PO TABS
50.0000 mg | ORAL_TABLET | Freq: Every day | ORAL | Status: DC
  Administered 2024-01-08 – 2024-01-09 (×2): 50 mg via ORAL
  Filled 2024-01-08 (×2): qty 1

## 2024-01-08 MED ORDER — SODIUM CHLORIDE 0.9 % IV SOLN
500.0000 mL | Freq: Once | INTRAVENOUS | Status: DC | PRN
Start: 2024-01-08 — End: 2024-01-10

## 2024-01-08 MED ORDER — PHENOL 1.4 % MT LIQD
1.0000 | OROMUCOSAL | Status: DC | PRN
Start: 2024-01-08 — End: 2024-01-10

## 2024-01-08 MED ORDER — ROCURONIUM BROMIDE 10 MG/ML (PF) SYRINGE
PREFILLED_SYRINGE | INTRAVENOUS | Status: DC | PRN
  Administered 2024-01-08: 50 mg via INTRAVENOUS

## 2024-01-08 MED ORDER — PROPOFOL 10 MG/ML IV BOLUS
INTRAVENOUS | Status: DC | PRN
  Administered 2024-01-08: 100 mg via INTRAVENOUS

## 2024-01-08 MED ORDER — METOPROLOL TARTRATE 5 MG/5ML IV SOLN: 2.5000 mg | INTRAVENOUS | Status: DC | PRN

## 2024-01-08 MED ORDER — GLYCOPYRROLATE 0.2 MG/ML IJ SOLN
INTRAMUSCULAR | Status: DC | PRN
Start: 2024-01-08 — End: 2024-01-08
  Administered 2024-01-08 (×2): .2 mg via INTRAVENOUS

## 2024-01-08 MED ORDER — ACETAMINOPHEN 650 MG RE SUPP: 325.0000 mg | RECTAL | Status: DC | PRN

## 2024-01-08 MED ORDER — ONDANSETRON HCL 4 MG/2ML IJ SOLN: 4.0000 mg | Freq: Four times a day (QID) | INTRAMUSCULAR | Status: DC | PRN

## 2024-01-08 MED ORDER — IODIXANOL 320 MG/ML IV SOLN
INTRAVENOUS | Status: DC | PRN
  Administered 2024-01-08: 16 mL via INTRA_ARTERIAL

## 2024-01-08 MED ORDER — FENTANYL CITRATE (PF) 100 MCG/2ML IJ SOLN
INTRAMUSCULAR | Status: AC
  Filled 2024-01-08: qty 2

## 2024-01-08 MED ORDER — CLOPIDOGREL BISULFATE 75 MG PO TABS
75.0000 mg | ORAL_TABLET | Freq: Every day | ORAL | Status: DC
  Administered 2024-01-09 – 2024-01-10 (×2): 75 mg via ORAL
  Filled 2024-01-08 (×2): qty 1

## 2024-01-08 MED ORDER — CHLORHEXIDINE GLUCONATE CLOTH 2 % EX PADS: 6.0000 | MEDICATED_PAD | Freq: Once | CUTANEOUS | Status: DC

## 2024-01-08 MED ORDER — HEMOSTATIC AGENTS (NO CHARGE) OPTIME
TOPICAL | Status: DC | PRN
  Administered 2024-01-08: 1 via TOPICAL

## 2024-01-08 MED ORDER — PROTAMINE SULFATE 10 MG/ML IV SOLN
INTRAVENOUS | Status: DC | PRN
  Administered 2024-01-08: 50 mg via INTRAVENOUS

## 2024-01-08 MED ORDER — CEFAZOLIN SODIUM-DEXTROSE 2-4 GM/100ML-% IV SOLN
2.0000 g | INTRAVENOUS | Status: AC
  Administered 2024-01-08: 2 g via INTRAVENOUS
  Filled 2024-01-08: qty 100

## 2024-01-08 MED ORDER — EPHEDRINE SULFATE-NACL 50-0.9 MG/10ML-% IV SOSY
PREFILLED_SYRINGE | INTRAVENOUS | Status: DC | PRN
  Administered 2024-01-08: 10 mg via INTRAVENOUS

## 2024-01-08 MED ORDER — PHENYLEPHRINE 80 MCG/ML (10ML) SYRINGE FOR IV PUSH (FOR BLOOD PRESSURE SUPPORT)
PREFILLED_SYRINGE | INTRAVENOUS | Status: DC | PRN
  Administered 2024-01-08: 80 ug via INTRAVENOUS
  Administered 2024-01-08: 240 ug via INTRAVENOUS
  Administered 2024-01-08 (×2): 40 ug via INTRAVENOUS
  Administered 2024-01-08: 240 ug via INTRAVENOUS

## 2024-01-08 MED ORDER — ORAL CARE MOUTH RINSE: 15.0000 mL | Freq: Once | OROMUCOSAL | Status: AC

## 2024-01-08 MED ORDER — CLONAZEPAM 0.5 MG PO TABS
1.0000 mg | ORAL_TABLET | Freq: Every day | ORAL | Status: DC
  Administered 2024-01-09: 1 mg via ORAL
  Filled 2024-01-08: qty 2

## 2024-01-08 MED ORDER — FENTANYL CITRATE (PF) 100 MCG/2ML IJ SOLN
25.0000 ug | INTRAMUSCULAR | Status: DC | PRN
  Administered 2024-01-08: 25 ug via INTRAVENOUS

## 2024-01-08 MED ORDER — POTASSIUM CHLORIDE CRYS ER 20 MEQ PO TBCR: 40.0000 meq | EXTENDED_RELEASE_TABLET | Freq: Every day | ORAL | Status: DC | PRN

## 2024-01-08 MED ORDER — ORAL CARE MOUTH RINSE: 15.0000 mL | OROMUCOSAL | Status: DC | PRN

## 2024-01-08 MED ORDER — NITROGLYCERIN 0.4 MG SL SUBL: 0.4000 mg | SUBLINGUAL_TABLET | SUBLINGUAL | Status: DC | PRN

## 2024-01-08 MED ORDER — SUGAMMADEX SODIUM 200 MG/2ML IV SOLN
INTRAVENOUS | Status: DC | PRN
  Administered 2024-01-08: 177 mg via INTRAVENOUS

## 2024-01-08 MED ORDER — HEPARIN SODIUM (PORCINE) 1000 UNIT/ML IJ SOLN
INTRAMUSCULAR | Status: DC | PRN
  Administered 2024-01-08: 9000 [IU] via INTRAVENOUS

## 2024-01-08 MED ORDER — OXYCODONE-ACETAMINOPHEN 5-325 MG PO TABS: 1.0000 | ORAL_TABLET | ORAL | Status: DC | PRN

## 2024-01-08 MED ORDER — FAMOTIDINE 20 MG PO TABS
20.0000 mg | ORAL_TABLET | Freq: Every day | ORAL | Status: DC | PRN
  Administered 2024-01-10: 20 mg via ORAL
  Filled 2024-01-08: qty 1

## 2024-01-08 MED ORDER — LABETALOL HCL 5 MG/ML IV SOLN: 10.0000 mg | INTRAVENOUS | Status: DC | PRN

## 2024-01-08 MED ORDER — DOCUSATE SODIUM 100 MG PO CAPS: 100.0000 mg | ORAL_CAPSULE | Freq: Every day | ORAL | Status: DC

## 2024-01-08 MED ORDER — POLYETHYLENE GLYCOL 3350 17 G PO PACK: 17.0000 g | PACK | Freq: Every day | ORAL | Status: DC | PRN

## 2024-01-08 MED ORDER — ATORVASTATIN CALCIUM 10 MG PO TABS
20.0000 mg | ORAL_TABLET | Freq: Every day | ORAL | Status: DC
  Administered 2024-01-08 – 2024-01-10 (×3): 20 mg via ORAL
  Filled 2024-01-08 (×3): qty 2

## 2024-01-08 MED ORDER — OXYCODONE HCL 5 MG PO TABS: 5.0000 mg | ORAL_TABLET | Freq: Once | ORAL | Status: DC | PRN

## 2024-01-08 MED ORDER — DEXAMETHASONE SODIUM PHOSPHATE 10 MG/ML IJ SOLN
INTRAMUSCULAR | Status: DC | PRN
  Administered 2024-01-08: 10 mg via INTRAVENOUS

## 2024-01-08 MED ORDER — ACETAMINOPHEN 325 MG PO TABS: 650.0000 mg | ORAL_TABLET | Freq: Every morning | ORAL | Status: DC

## 2024-01-08 MED ORDER — CLEVIDIPINE BUTYRATE 0.5 MG/ML IV EMUL
INTRAVENOUS | Status: DC | PRN
  Administered 2024-01-08: .5 mg/h via INTRAVENOUS

## 2024-01-08 MED ORDER — CHLORHEXIDINE GLUCONATE CLOTH 2 % EX PADS
6.0000 | MEDICATED_PAD | Freq: Once | CUTANEOUS | Status: DC
  Administered 2024-01-08: 6 via TOPICAL

## 2024-01-08 MED ORDER — LACTATED RINGERS IV SOLN: INTRAVENOUS | Status: DC

## 2024-01-08 MED ORDER — FENTANYL CITRATE (PF) 250 MCG/5ML IJ SOLN
INTRAMUSCULAR | Status: DC | PRN
  Administered 2024-01-08: 100 ug via INTRAVENOUS

## 2024-01-08 MED ORDER — LIDOCAINE HCL (PF) 1 % IJ SOLN
INTRAMUSCULAR | Status: AC
  Filled 2024-01-08: qty 5

## 2024-01-08 MED ORDER — CEFAZOLIN SODIUM-DEXTROSE 2-4 GM/100ML-% IV SOLN
2.0000 g | Freq: Three times a day (TID) | INTRAVENOUS | Status: AC
  Administered 2024-01-08 – 2024-01-09 (×2): 2 g via INTRAVENOUS
  Filled 2024-01-08 (×2): qty 100

## 2024-01-08 MED ORDER — SODIUM CHLORIDE 0.9 % IV SOLN: INTRAVENOUS | Status: DC

## 2024-01-08 MED ORDER — FENTANYL CITRATE (PF) 250 MCG/5ML IJ SOLN
INTRAMUSCULAR | Status: AC
  Filled 2024-01-08: qty 5

## 2024-01-08 MED ORDER — DOCUSATE SODIUM 100 MG PO CAPS
100.0000 mg | ORAL_CAPSULE | Freq: Two times a day (BID) | ORAL | Status: DC
  Administered 2024-01-08 – 2024-01-10 (×4): 100 mg via ORAL
  Filled 2024-01-08 (×4): qty 1

## 2024-01-08 MED ORDER — PHENYLEPHRINE HCL-NACL 20-0.9 MG/250ML-% IV SOLN
INTRAVENOUS | Status: DC | PRN
  Administered 2024-01-08: 60 ug/min via INTRAVENOUS

## 2024-01-08 MED ORDER — HYDRALAZINE HCL 20 MG/ML IJ SOLN: 5.0000 mg | INTRAMUSCULAR | Status: DC | PRN

## 2024-01-08 MED ORDER — CHLORHEXIDINE GLUCONATE 0.12 % MT SOLN
15.0000 mL | Freq: Once | OROMUCOSAL | Status: AC
  Administered 2024-01-08: 15 mL via OROMUCOSAL
  Filled 2024-01-08: qty 15

## 2024-01-08 MED ORDER — SODIUM CHLORIDE 0.9 % IV SOLN: INTRAVENOUS | Status: AC

## 2024-01-08 MED ORDER — ONDANSETRON HCL 4 MG/2ML IJ SOLN
INTRAMUSCULAR | Status: DC | PRN
  Administered 2024-01-08: 4 mg via INTRAVENOUS

## 2024-01-08 MED ORDER — HEPARIN 6000 UNIT IRRIGATION SOLUTION
Status: AC
  Filled 2024-01-08: qty 500

## 2024-01-08 MED ORDER — PROPOFOL 10 MG/ML IV BOLUS
INTRAVENOUS | Status: AC
Start: 2024-01-08 — End: 2024-01-08
  Filled 2024-01-08: qty 20

## 2024-01-08 MED ORDER — HEPARIN 6000 UNIT IRRIGATION SOLUTION
Status: DC | PRN
  Administered 2024-01-08: 1

## 2024-01-08 MED ORDER — ACETAMINOPHEN 325 MG PO TABS
325.0000 mg | ORAL_TABLET | ORAL | Status: DC | PRN
  Administered 2024-01-09: 650 mg via ORAL
  Administered 2024-01-09: 325 mg via ORAL
  Administered 2024-01-10: 650 mg via ORAL
  Filled 2024-01-08: qty 1
  Filled 2024-01-08 (×2): qty 2

## 2024-01-08 MED ORDER — LEVOTHYROXINE SODIUM 50 MCG PO TABS
50.0000 ug | ORAL_TABLET | Freq: Every day | ORAL | Status: DC
  Administered 2024-01-09 – 2024-01-10 (×2): 50 ug via ORAL
  Filled 2024-01-08 (×2): qty 1

## 2024-01-08 MED ORDER — MORPHINE SULFATE (PF) 2 MG/ML IV SOLN: 2.0000 mg | INTRAVENOUS | Status: DC | PRN

## 2024-01-08 MED ORDER — ASPIRIN 81 MG PO TBEC
81.0000 mg | DELAYED_RELEASE_TABLET | Freq: Every day | ORAL | Status: DC
  Administered 2024-01-09 – 2024-01-10 (×2): 81 mg via ORAL
  Filled 2024-01-08 (×2): qty 1

## 2024-01-08 MED ORDER — LIDOCAINE 2% (20 MG/ML) 5 ML SYRINGE
INTRAMUSCULAR | Status: DC | PRN
  Administered 2024-01-08: 50 mg via INTRAVENOUS

## 2024-01-08 MED ORDER — OXYCODONE HCL 5 MG/5ML PO SOLN: 5.0000 mg | Freq: Once | ORAL | Status: DC | PRN

## 2024-01-08 SURGICAL SUPPLY — 46 items
BAG BANDED W/RUBBER/TAPE 36X54 (MISCELLANEOUS) ×1 IMPLANT
BENZOIN TINCTURE PRP APPL 2/3 (GAUZE/BANDAGES/DRESSINGS) IMPLANT
CANISTER SUCTION 3000ML PPV (SUCTIONS) ×1 IMPLANT
CATH BALLN ENROUTE 5X25 (CATHETERS) IMPLANT
CHLORAPREP W/TINT 26 (MISCELLANEOUS) ×2 IMPLANT
CLIP LIGATING EXTRA MED SLVR (CLIP) IMPLANT
CLIP LIGATING EXTRA SM BLUE (MISCELLANEOUS) IMPLANT
CLSR STERI-STRIP ANTIMIC 1/2X4 (GAUZE/BANDAGES/DRESSINGS) IMPLANT
COVER DOME SNAP 22 D (MISCELLANEOUS) ×1 IMPLANT
COVER PROBE W GEL 5X96 (DRAPES) ×1 IMPLANT
DERMABOND ADVANCED .7 DNX6 (GAUZE/BANDAGES/DRESSINGS) ×1 IMPLANT
DRAPE FEMORAL ANGIO 80X135IN (DRAPES) ×1 IMPLANT
DRSG TEGADERM 2-3/8X2-3/4 SM (GAUZE/BANDAGES/DRESSINGS) IMPLANT
DRSG TEGADERM 4X4.75 (GAUZE/BANDAGES/DRESSINGS) IMPLANT
ELECTRODE REM PT RTRN 9FT ADLT (ELECTROSURGICAL) ×1 IMPLANT
GAUZE SPONGE 4X4 12PLY STRL (GAUZE/BANDAGES/DRESSINGS) ×1 IMPLANT
GLOVE BIO SURGEON STRL SZ8 (GLOVE) ×1 IMPLANT
GOWN STRL REUS W/ TWL LRG LVL3 (GOWN DISPOSABLE) ×2 IMPLANT
GOWN STRL REUS W/ TWL XL LVL3 (GOWN DISPOSABLE) ×1 IMPLANT
GUIDEWIRE ENROUTE 0.014 (WIRE) ×1 IMPLANT
HEMOSTAT SNOW SURGICEL 2X4 (HEMOSTASIS) IMPLANT
KIT BASIN OR (CUSTOM PROCEDURE TRAY) ×1 IMPLANT
KIT ENCORE 26 ADVANTAGE (KITS) ×1 IMPLANT
KIT INTRODUCER GALT 7 (INTRODUCER) ×1 IMPLANT
KIT TURNOVER KIT B (KITS) ×1 IMPLANT
NDL HYPO 25GX1X1/2 BEV (NEEDLE) IMPLANT
NEEDLE HYPO 25GX1X1/2 BEV (NEEDLE) IMPLANT
NS IRRIG 1000ML POUR BTL (IV SOLUTION) ×1 IMPLANT
PACK CAROTID (CUSTOM PROCEDURE TRAY) ×1 IMPLANT
POSITIONER HEAD DONUT 9IN (MISCELLANEOUS) ×1 IMPLANT
SET MICROPUNCTURE 5F STIFF (MISCELLANEOUS) ×1 IMPLANT
SHEATH AVANTI 11CM 5FR (SHEATH) IMPLANT
SHUNT CAROTID BYPASS 10 (VASCULAR PRODUCTS) IMPLANT
STENT TRANSCAROTID SYSTEM 8X40 (Permanent Stent) IMPLANT
SUT MNCRL AB 4-0 PS2 18 (SUTURE) ×1 IMPLANT
SUT PROLENE 5 0 C 1 24 (SUTURE) ×1 IMPLANT
SUT PROLENE 6 0 BV (SUTURE) IMPLANT
SUT SILK 2 0 SH (SUTURE) ×1 IMPLANT
SUT VIC AB 3-0 SH 27X BRD (SUTURE) ×1 IMPLANT
SYR 10ML LL (SYRINGE) ×3 IMPLANT
SYR 20ML LL LF (SYRINGE) ×1 IMPLANT
SYR CONTROL 10ML LL (SYRINGE) IMPLANT
SYSTEM TRANSCAROTID NEUROPRTCT (MISCELLANEOUS) ×1 IMPLANT
TOWEL GREEN STERILE (TOWEL DISPOSABLE) ×1 IMPLANT
WATER STERILE IRR 1000ML POUR (IV SOLUTION) ×1 IMPLANT
WIRE BENTSON .035X145CM (WIRE) ×1 IMPLANT

## 2024-01-08 NOTE — Progress Notes (Signed)
 Looks good POD#0 s/p R TCAR. No neurologic deficits. Appears at neurologic baseline. Dressing clean and dry. Mobilize tomorrow. Anticipate DC tomorrow if no issues overnight.  Debby SAILOR. Magda, MD [facility] Phone Number: [phone] 01/08/2024 4:41 PM

## 2024-01-08 NOTE — Op Note (Signed)
 DATE OF SERVICE: 01/08/2024  PATIENT:  Jordan Hammond  83 y.o. male  PRE-OPERATIVE DIAGNOSIS:  symptomatic right carotid artery stenosis  POST-OPERATIVE DIAGNOSIS:  Same  PROCEDURE:   Right transcarotid artery revascularization  SURGEON:  Surgeons and Role:    * Magda Debby SAILOR, MD - Primary  ASSISTANT: Adina Sender, PA-C  An experienced assistant was required given the complexity of this procedure and the standard of surgical care. My assistant helped with exposure through counter tension, suctioning, ligation and retraction to better visualize the surgical field.  My assistant expedited sewing during the case by following my sutures. Wherever I use the term we in the report, my assistant actively helped me with that portion of the procedure.  ANESTHESIA:   general  EBL:  BLOOD ADMINISTERED:none  DRAINS: none   LOCAL MEDICATIONS USED:  NONE  SPECIMEN:  none  COUNTS: confirmed correct.  TOURNIQUET:  none  PATIENT DISPOSITION:  PACU - hemodynamically stable.   Delay start of Pharmacological VTE agent (>24hrs) due to surgical blood loss or risk of bleeding: no  INDICATION FOR PROCEDURE: Jordan Hammond is a 83 y.o. male with symptomatic right carotid artery stenosis. After careful discussion of risks, benefits, and alternatives the patient was offered right TCAR. We discussed risk of stroke, cranial nerve injury, and hematoma. The patient understood and wished to proceed.  OPERATIVE FINDINGS: successful TCAR. Patient awoke at neurologic baseline. Exam reconfirmed in PACU.   DESCRIPTION OF PROCEDURE: After identification of the patient in the pre-operative holding area, the patient was transferred to the operating room. The patient was positioned supine on the operating room table. Anesthesia was induced. The right neck and groins were prepped and draped in standard fashion. A surgical pause was performed confirming correct patient, procedure, and operative  location.  Using intraoperative ultrasound the course of the right common carotid artery was mapped on the skin.  A transverse incision was made between the sternal and clavicular heads of the sternocleidomastoid muscle, below the omohyoid. Following longitudinal division of the carotid sheath the jugular vein was partially skeletonized and retracted medially. Once 3 cm of common carotid artery (CCA) were isolated, umbilical tape was placed around the proximal 1/3 of the CCA under direct vision. A 5-O Prolene suture was pre-placed in the anterior wall of the CCA, in a "U stitch" configuration, close to the clavicle to facilitate hemostasis upon removal of the arterial sheath at completion of the TCAR procedure.   The contralateral common femoral vein (CFV) was accessed under ultrasound guidance, using standard Seldinger and micropuncture access technique. The venous return sheath was advanced into the CFV over the 0.035" wire provided. Blood was aspirated from the flow line followed by flushing of the Venous Sheath with heparinized saline. The Venous Sheath was secured to the patient's skin with suture to maintain optimal position in the vessel. Heparin  was given to obtain a therapeutic activated clotting time >250 seconds prior to arterial access.   A 4-French non-stiffened ENHANCE Transcarotid / Peripheral Access set was used, puncturing the artery with the 21G needle through the pre-placed "U" stitch while holding gentle traction on the umbilical tape to stabilize and centralize the CCA within the incision. Careful attention was paid to the change in CCA shape when using the umbilical tape to control or lift the artery. The micropuncture wire was then advanced 3-4 cm into the CCA and, the 21G needle was removed. The micropuncture sheath was advanced 2-3 cm into the CCA and the wire  and dilator were removed. Pulsatile backflow indicated correct positioning. The provided 0.035 J-tipped guidewire was  inserted as close as possible to the bifurcation without engaging the lesion. After micropuncture sheath removal, the Transcarotid Arterial Sheath was advanced to the 2.5cm marker and the 0.035" wire and dilator were then removed. Arterial Sheath position was assessed under fluoroscopy in two projections to ensure that the sheath tip was oriented coaxially in the CCA. The Arterial Sheath was sutured to the patient with gentle forward tension. Blood was slowly aspirated followed by flushing with heparinized saline. No ingress of air bubbles through the passive hemostatic valve was observed. The stopcocks were closed. Traction applied to the CCA previously to facilitate access was gently released.  The Flow Controller was connected to the Transcarotid Arterial Sheath, prepared by passively allowing a column of arterial blood to fill the line and connected to the Venous Return Sheath. CCA inflow was occluded proximal to the arteriotomy with a vascular clamp to achieve active flow reversal. To confirm flow reversal, a saline bolus was delivered into the venous flow line on both "High" and "Low" flow settings of the Flow Controller. Angiograms were performed with slow injections of a small amount of contrast filling just past the lesion to minimize antegrade transmission of micro-bubbles.   Prior to lesion manipulation, heart rate (70bpm) and systolic BP (140-149mmHg) were managed upwards to optimize flow reversal and procedural neuroprotection. The lesion was crossed with an 0.014" ENROUTE guidewire and pre-dilation of the lesion was performed with a 5x51mm rapid exchange 0.014" compatible balloon catheter to 8 atmospheres for 10 seconds. Stenting was performed with an 8x79mm ENROUTE Transcarotid stent, sized appropriately to the right CCA.  A total of of flow reversal was used.  AP angiogram (gentle contrast injections) were performed to confirm stent placement and arterial wall stent apposition. At  [facility] case completion, antegrade flow was restored by releasing the clamp on the CCA then closing the NPS stopcocks to the flow lines. The Transcarotid Arterial Sheath was removed and the pre-closure suture was tied. Heparin  reversal was employed. The Venous Return Sheath was removed and hemostasis was achieved with brief manual compression.   Doppler flow was confirmed to be normal across the access site. The neck incision was closed in layers using 3-O vicryl and 4-O monocryl. Clean bandage was applied to the neck.   Upon completion of the case instrument and sharps counts were confirmed correct. The patient was transferred to the PACU in good condition. I was present for all portions of the procedure.  FOLLOW UP PLAN: Assuming a normal postoperative course, I will see the patient in 4 weeks with carotid duplex.   Debby SAILOR. Magda, MD [facility] Phone Number: [phone] 01/08/2024 1:54 PM

## 2024-01-08 NOTE — Transfer of Care (Signed)
 Immediate Anesthesia Transfer of Care Note  Patient: Jordan Hammond  Procedure(s) Performed: TRANSCAROTID ARTERY REVASCULARIZATION (TCAR) (Right)  Patient Location: PACU  Anesthesia Type:General  Level of Consciousness: awake, alert , and oriented  Airway & Oxygen Therapy: Patient Spontanous Breathing  Post-op Assessment: Report given to RN and Post -op Vital signs reviewed and stable  Post vital signs: Reviewed and stable  Last Vitals:  Vitals Value Taken Time  BP 96/51 01/08/24 12:40  Temp    Pulse 68 01/08/24 12:41  Resp 14 01/08/24 12:41  SpO2 97 % 01/08/24 12:41  Vitals shown include unfiled device data.  Last Pain:  Vitals:   01/08/24 0928  TempSrc:   PainSc: 0-No pain         Complications: No notable events documented.

## 2024-01-08 NOTE — Interval H&P Note (Signed)
 History and Physical Interval Note:  01/08/2024 10:54 AM  Jordan Hammond  has presented today for surgery, with the diagnosis of acute cva.  The various methods of treatment have been discussed with the patient and family. After consideration of risks, benefits and other options for treatment, the patient has consented to  Procedure(s): TRANSCAROTID ARTERY REVASCULARIZATION (TCAR) (Right) as a surgical intervention.  The patient's history has been reviewed, patient examined, no change in status, stable for surgery.  I have reviewed the patient's chart and labs.  Questions were answered to the patient's satisfaction.     Debby LOISE Robertson

## 2024-01-08 NOTE — Progress Notes (Signed)
   01/08/24 2050 01/08/24 2100 01/08/24 2133  Assess: MEWS Score  Temp 97.8 F (36.6 C)  --   --   BP (!) 89/49 (!) 80/55 (!) 83/46  MAP (mmHg) (!) 62 (!) 60 (!) 58  Pulse Rate (!) 44 (!) 46 (!) 51  ECG Heart Rate (!) 43 (!) 47 (!) 46  Resp 18 18 16   Level of Consciousness Alert  --   --   SpO2 97 % 96 % 96 %  O2 Device  --   --   --   Assess: MEWS Score  MEWS Temp 0 0 0  MEWS Systolic 1 2 1   MEWS Pulse 1 1 1   MEWS RR 0 0 0  MEWS LOC 0 0 0  MEWS Score 2 3 2   MEWS Score Color Yellow Yellow Yellow  Assess: if the MEWS score is Yellow or Red  Were vital signs accurate and taken at a resting state?  --   --   --   Does the patient meet 2 or more of the SIRS criteria?  --   --   --   MEWS guidelines implemented   --   --   --   Treat  MEWS Interventions  --   --   --   Take Vital Signs  Increase Vital Sign Frequency   --   --   --   Escalate  MEWS: Escalate  --   --   --   Notify: Charge Nurse/RN  Name of Charge Nurse/RN Notified  --   --   --   Assess: SIRS CRITERIA  SIRS Temperature  0 0 0  SIRS Respirations  0 0 0  SIRS Pulse 0 0 0  SIRS WBC 0 0 0  SIRS Score Sum  0 0 0    01/08/24 2135  Assess: MEWS Score  Temp  --   BP  --   MAP (mmHg)  --   Pulse Rate (!) 48  ECG Heart Rate (!) 46  Resp 17  Level of Consciousness  --   SpO2 97 %  O2 Device Room Air  Assess: MEWS Score  MEWS Temp 0  MEWS Systolic 1  MEWS Pulse 1  MEWS RR 0  MEWS LOC 0  MEWS Score 2  MEWS Score Color Yellow  Assess: if the MEWS score is Yellow or Red  Were vital signs accurate and taken at a resting state? Yes  Does the patient meet 2 or more of the SIRS criteria? No  MEWS guidelines implemented  Yes, yellow  Treat  MEWS Interventions Considered administering scheduled or prn medications/treatments as ordered  Take Vital Signs  Increase Vital Sign Frequency  Yellow: Q2hr x1, continue Q4hrs until patient remains green for 12hrs  Escalate  MEWS: Escalate Yellow: Discuss with charge  nurse and consider notifying provider and/or RRT  Notify: Charge Nurse/RN  Name of Charge Nurse/RN Notified Stephanie, RN  Assess: SIRS CRITERIA  SIRS Temperature  0  SIRS Respirations  0  SIRS Pulse 0  SIRS WBC 0  SIRS Score Sum  0   Pt asymptomatic with low BPs, PRN bolus implemented. Will continue to monitor.  Late entry Addendum: Dr. Magda paged and updated regarding the above. Provider endorsed given pt is asymptomatic in bed to continue monitoring without additional intervention and to repage if pt became symptomatic.

## 2024-01-08 NOTE — Anesthesia Procedure Notes (Signed)
 Procedure Name: Intubation Date/Time: 01/08/2024 11:10 AM  Performed by: Zelphia Norleen HERO, CRNAPre-anesthesia Checklist: Patient identified, Emergency Drugs available, Suction available and Patient being monitored Patient Re-evaluated:Patient Re-evaluated prior to induction Oxygen Delivery Method: Circle system utilized Preoxygenation: Pre-oxygenation with 100% oxygen Induction Type: IV induction and Cricoid Pressure applied Ventilation: Mask ventilation without difficulty Laryngoscope Size: Mac and 4 Grade View: Grade I Tube type: Oral Tube size: 7.0 mm Number of attempts: 1 Airway Equipment and Method: Stylet and Oral airway Placement Confirmation: ETT inserted through vocal cords under direct vision, positive ETCO2 and breath sounds checked- equal and bilateral Secured at: 23 cm Tube secured with: Tape Dental Injury: Teeth and Oropharynx as per pre-operative assessment

## 2024-01-08 NOTE — Anesthesia Postprocedure Evaluation (Signed)
 Anesthesia Post Note  Patient: Jordan Hammond  Procedure(s) Performed: TRANSCAROTID ARTERY REVASCULARIZATION (TCAR) (Right)     Patient location during evaluation: PACU Anesthesia Type: General Level of consciousness: awake and alert Pain management: pain level controlled Vital Signs Assessment: post-procedure vital signs reviewed and stable Respiratory status: spontaneous breathing, nonlabored ventilation, respiratory function stable and patient connected to nasal cannula oxygen Cardiovascular status: blood pressure returned to baseline and stable Postop Assessment: no apparent nausea or vomiting Anesthetic complications: no   No notable events documented.  Last Vitals:  Vitals:   01/08/24 1430 01/08/24 1500  BP: (!) 94/51 (!) 98/49  Pulse: (!) 52 (!) 50  Resp: 13 13  Temp:    SpO2: 98% 99%    Last Pain:  Vitals:   01/08/24 1410  TempSrc:   PainSc: Asleep                 Leemon Ayala S

## 2024-01-08 NOTE — Anesthesia Procedure Notes (Signed)
 Arterial Line Insertion Start/End7/30/2025 10:00 AM, 01/08/2024 10:05 AM Performed by: Claudene Arlin LABOR, CRNA, CRNA  Patient location: Pre-op . Preanesthetic checklist: patient identified, IV checked, site marked, risks and benefits discussed, surgical consent, monitors and equipment checked, pre-op  evaluation, timeout performed and anesthesia consent Lidocaine  1% used for infiltration Left, radial was placed Catheter size: 20 G Hand hygiene performed  and maximum sterile barriers used  Allen's test indicative of satisfactory collateral circulation Attempts: 1 Procedure performed without using ultrasound guided technique. Following insertion, dressing applied and Biopatch. Post procedure assessment: normal and unchanged  Patient tolerated the procedure well with no immediate complications.

## 2024-01-08 NOTE — Plan of Care (Signed)
°  Problem: Health Behavior/Discharge Planning: Goal: Ability to manage health-related needs will improve Outcome: Progressing   Problem: Clinical Measurements: Goal: Ability to maintain clinical measurements within normal limits will improve Outcome: Progressing   Problem: Nutrition: Goal: Adequate nutrition will be maintained Outcome: Progressing   Problem: Coping: Goal: Level of anxiety will decrease Outcome: Progressing   

## 2024-01-09 ENCOUNTER — Encounter (HOSPITAL_COMMUNITY): Payer: Self-pay | Admitting: Vascular Surgery

## 2024-01-09 DIAGNOSIS — Z95828 Presence of other vascular implants and grafts: Secondary | ICD-10-CM

## 2024-01-09 DIAGNOSIS — Z48812 Encounter for surgical aftercare following surgery on the circulatory system: Secondary | ICD-10-CM

## 2024-01-09 LAB — CBC
HCT: 33.2 % — ABNORMAL LOW (ref 39.0–52.0)
Hemoglobin: 11.2 g/dL — ABNORMAL LOW (ref 13.0–17.0)
MCH: 30.6 pg (ref 26.0–34.0)
MCHC: 33.7 g/dL (ref 30.0–36.0)
MCV: 90.7 fL (ref 80.0–100.0)
Platelets: 149 K/uL — ABNORMAL LOW (ref 150–400)
RBC: 3.66 MIL/uL — ABNORMAL LOW (ref 4.22–5.81)
RDW: 12.9 % (ref 11.5–15.5)
WBC: 6.3 K/uL (ref 4.0–10.5)
nRBC: 0 % (ref 0.0–0.2)

## 2024-01-09 LAB — BASIC METABOLIC PANEL WITH GFR
Anion gap: 7 (ref 5–15)
BUN: 20 mg/dL (ref 8–23)
CO2: 21 mmol/L — ABNORMAL LOW (ref 22–32)
Calcium: 8.5 mg/dL — ABNORMAL LOW (ref 8.9–10.3)
Chloride: 109 mmol/L (ref 98–111)
Creatinine, Ser: 1.2 mg/dL (ref 0.61–1.24)
GFR, Estimated: >60 mL/min (ref >60)
Glucose, Bld: 138 mg/dL — ABNORMAL HIGH (ref 70–99)
Potassium: 4.2 mmol/L (ref 3.5–5.1)
Sodium: 137 mmol/L (ref 135–145)

## 2024-01-09 LAB — POCT ACTIVATED CLOTTING TIME: Activated Clotting Time: 302 s

## 2024-01-09 MED ORDER — PSEUDOEPHEDRINE HCL 30 MG PO TABS: 30.0000 mg | ORAL_TABLET | Freq: Four times a day (QID) | ORAL | Status: DC | PRN

## 2024-01-09 MED ORDER — PSEUDOEPHEDRINE HCL 30 MG PO TABS
30.0000 mg | ORAL_TABLET | Freq: Four times a day (QID) | ORAL | Status: DC
  Administered 2024-01-09 – 2024-01-10 (×4): 30 mg via ORAL
  Filled 2024-01-09 (×5): qty 1

## 2024-01-09 NOTE — Progress Notes (Addendum)
 Progress Note    01/09/2024 7:36 AM 1 Day Post-Op  Subjective:  no complaints. Denies any pain, dizziness, or headache.    Vitals:   01/09/24 0605 01/09/24 0630  BP: (!) 81/46 (!) 80/67  Pulse: (!) 40 (!) 41  Resp: 16 14  Temp:  97.9 F (36.6 C)  SpO2: 98% 95%    Physical Exam: General:  sitting up in the chair eating breakfast Cardiac:  bradycardic, HR in 40s. Asymptomatic hypotension 80s/40s Lungs:  nonlabored Incisions:  right sided neck incision dry and bandaged. Left groin venous access site soft without hematoma Extremities:  moving all extremities equally Neuro: no slurred speech, no focal weakness or numbness, no lip droop   CBC    Component Value Date/Time   WBC 6.3 01/09/2024 0412   RBC 3.66 (L) 01/09/2024 0412   HGB 11.2 (L) 01/09/2024 0412   HGB 14.6 10/31/2023 1650   HCT 33.2 (L) 01/09/2024 0412   HCT 43.2 10/31/2023 1650   PLT 149 (L) 01/09/2024 0412   PLT 176 10/31/2023 1650   MCV 90.7 01/09/2024 0412   MCV 94 10/31/2023 1650   MCH 30.6 01/09/2024 0412   MCHC 33.7 01/09/2024 0412   RDW 12.9 01/09/2024 0412   RDW 12.1 10/31/2023 1650   LYMPHSABS 1.4 12/31/2023 1351   MONOABS 0.5 12/31/2023 1351   EOSABS 0.2 12/31/2023 1351   BASOSABS 0.0 12/31/2023 1351    BMET    Component Value Date/Time   NA 137 01/09/2024 0412   NA 140 10/31/2023 1650   K 4.2 01/09/2024 0412   CL 109 01/09/2024 0412   CO2 21 (L) 01/09/2024 0412   GLUCOSE 138 (H) 01/09/2024 0412   BUN 20 01/09/2024 0412   BUN 22 10/31/2023 1650   CREATININE 1.20 01/09/2024 0412   CALCIUM  8.5 (L) 01/09/2024 0412   GFRNONAA >60 01/09/2024 0412   GFRAA >60 02/03/2017 0527    INR    Component Value Date/Time   INR 1.1 12/31/2023 1351     Intake/Output Summary (Last 24 hours) at 01/09/2024 0736 Last data filed at 01/09/2024 0700 Gross per 24 hour  Intake 8785.39 ml  Output 450 ml  Net 8335.39 ml      Assessment/Plan:  83 y.o. male is 1 day post op, s/p: right TCAR     -He feels well this morning without any complaints. He denies any pain at his incision site. He also denies any strokelike symptoms or dizziness -Right sided neck incision is bandaged and dry without evidence of bleeding or hematoma. Left groin venous access site is also dressed and dry without bleeding -He is moving all extremities equally on exam and has no slurred speech or facial droop -He is tolerating a normal diet without swallowing difficulty. He is also voiding without difficulty -Hgb stable at 11.2 -He has been bradycardic with HR in the 40s and hypotensive with BP in the 80s/40s since surgery, likely due to irritation of carotid baroreceptors. He is asymptomatic. At this time we will continue to monitor his vitals, hold all anti-hypertensive meds, and will start sudafed 30mg  q6H -Anticipate discharge once he is mobilizing and his blood pressure has improved   Ahmed Holster, PA-C [facility] [phone] 01/09/2024 7:36 AM   VASCULAR STAFF ADDENDUM: I have independently interviewed and examined the patient. I agree with the above.  Asymptomatic bradycardia and hypotension postoperative day 1 after right TCAR for symptomatic carotid artery stenosis. Patient is doing well overall with no complaints.  No  focal neurologic signs. Neck is soft without hematoma Plan start Sudafed and continue throughout the day.  Monitor response of blood pressure.  Will keep another day and consider discharge tomorrow if blood pressure improves.  Debby SAILOR. Magda, MD [facility] Phone Number: [phone] 01/09/2024 8:27 AM

## 2024-01-09 NOTE — Plan of Care (Signed)

## 2024-01-10 ENCOUNTER — Other Ambulatory Visit (HOSPITAL_COMMUNITY): Payer: Self-pay

## 2024-01-10 LAB — BASIC METABOLIC PANEL WITH GFR
Anion gap: 7 (ref 5–15)
BUN: 25 mg/dL — ABNORMAL HIGH (ref 8–23)
CO2: 23 mmol/L (ref 22–32)
Calcium: 8.5 mg/dL — ABNORMAL LOW (ref 8.9–10.3)
Chloride: 107 mmol/L (ref 98–111)
Creatinine, Ser: 1.12 mg/dL (ref 0.61–1.24)
GFR, Estimated: >60 mL/min (ref >60)
Glucose, Bld: 111 mg/dL — ABNORMAL HIGH (ref 70–99)
Potassium: 3.9 mmol/L (ref 3.5–5.1)
Sodium: 137 mmol/L (ref 135–145)

## 2024-01-10 MED ORDER — OXYCODONE HCL 5 MG PO TABS
5.0000 mg | ORAL_TABLET | Freq: Four times a day (QID) | ORAL | 0 refills | Status: AC | PRN
  Filled 2024-01-10: qty 10, 3d supply, fill #0

## 2024-01-10 NOTE — Progress Notes (Addendum)
 Mobility Specialist Progress Note:   01/10/24 0951  Mobility  Activity Pivoted/transferred from bed to chair  Level of Assistance Independent  Assistive Device None  Distance Ambulated (ft) 3 ft  Activity Response Tolerated well  Mobility Referral Yes  Mobility visit 1 Mobility  Mobility Specialist Start Time (ACUTE ONLY) 0951  Mobility Specialist Stop Time (ACUTE ONLY) 1001  Mobility Specialist Time Calculation (min) (ACUTE ONLY) 10 min   Pt received in bed, agreeable to mobility session. Recorded pt's BP, see below. Deferred ambulation at this time. Encouraged pt to sit up in chair, will attempt to f/u if time permits. Sitting up with all needs met, guest at bedside. HR in 40's throughout session.  Lying: 98/53 (68) Sitting EOB: 98/37 (57) Standing 1 min:85/47 (59) Standing 3 min:84/45 (58)   Randol Clause Mobility Specialist Please contact via Special educational needs teacher or  Rehab office at [phone]

## 2024-01-10 NOTE — TOC Transition Note (Signed)
 Transition of Care (TOC) - Discharge Note Rayfield Gobble RN, BSN Inpatient Care Management Unit 4E- RN Case Manager See Treatment Team for direct phone #   Patient Details  Name: Jordan Hammond MRN: [id number] Date of Birth: 24-Nov-1940  Transition of Care [facility]) CM/SW Contact:  Gobble Rayfield Hurst, RN Phone Number: 01/10/2024, 2:42 PM   Clinical Narrative:    Pt stable for transition home today, CM received notice from [facility] liaison that [facility] office made referral for [facility] needs. Liaison notified of discharge and will follow up with pt.   Pt from home w/ spouse, will have transportation home.   No further CM needs noted.    Final next level of care: Home w Home Health Services Barriers to Discharge: No Barriers Identified   Patient Goals and CMS Choice Patient states their goals for this hospitalization and ongoing recovery are:: return home   Choice offered to / list presented to : Patient, Spouse      Discharge Placement                 Home w/ [facility]      Discharge Plan and Services Additional resources added to the After Visit Summary for   In-house Referral: NA Discharge Planning Services: NA Post Acute Care Choice: Home Health          DME Arranged: N/A DME Agency: NA       HH Arranged: PT, RN HH Agency: [facility] Date HH Agency Contacted: 01/10/24 Time HH Agency Contacted: 1441 Representative spoke with at [facility] Agency: [place]  Social Drivers of Health (SDOH) Interventions SDOH Screenings   Food Insecurity: No Food Insecurity (01/08/2024)  Housing: Low Risk  (01/08/2024)  Transportation Needs: No Transportation Needs (01/08/2024)  Utilities: At Risk (01/08/2024)  Social Connections: Unknown (01/08/2024)  Stress: No Stress Concern Present (09/28/2021)   Received from [facility]  Tobacco Use: Low Risk  (01/08/2024)     Readmission Risk Interventions    01/10/2024    2:41 PM  Readmission Risk Prevention Plan  Transportation  Screening Complete  Home Care Screening Complete  Medication Review (RN CM) Complete

## 2024-01-10 NOTE — Progress Notes (Signed)
  Progress Note    01/10/2024 8:11 AM 2 Days Post-Op  Subjective:  no complaints, says he walked up and down the halls yesterday without dizziness     Vitals:   01/09/24 2323 01/10/24 0316  BP: (!) 102/55 (!) 95/43  Pulse: (!) 46 (!) 49  Resp: 18 16  Temp: 97.7 F (36.5 C) 97.8 F (36.6 C)  SpO2: 96%     Physical Exam: General:  sitting up in bed, NAD Cardiac:  bradycardic, HR 50s. SBPs 90s-100s Lungs:  nonlabored Incisions:  right sided neck incision dry and intact with steri strips Neuro: moving all extremities equally, no slurred speech, no lip droop CBC    Component Value Date/Time   WBC 6.3 01/09/2024 0412   RBC 3.66 (L) 01/09/2024 0412   HGB 11.2 (L) 01/09/2024 0412   HGB 14.6 10/31/2023 1650   HCT 33.2 (L) 01/09/2024 0412   HCT 43.2 10/31/2023 1650   PLT 149 (L) 01/09/2024 0412   PLT 176 10/31/2023 1650   MCV 90.7 01/09/2024 0412   MCV 94 10/31/2023 1650   MCH 30.6 01/09/2024 0412   MCHC 33.7 01/09/2024 0412   RDW 12.9 01/09/2024 0412   RDW 12.1 10/31/2023 1650   LYMPHSABS 1.4 12/31/2023 1351   MONOABS 0.5 12/31/2023 1351   EOSABS 0.2 12/31/2023 1351   BASOSABS 0.0 12/31/2023 1351    BMET    Component Value Date/Time   NA 137 01/10/2024 0346   NA 140 10/31/2023 1650   K 3.9 01/10/2024 0346   CL 107 01/10/2024 0346   CO2 23 01/10/2024 0346   GLUCOSE 111 (H) 01/10/2024 0346   BUN 25 (H) 01/10/2024 0346   BUN 22 10/31/2023 1650   CREATININE 1.12 01/10/2024 0346   CALCIUM  8.5 (L) 01/10/2024 0346   GFRNONAA >60 01/10/2024 0346   GFRAA >60 02/03/2017 0527    INR    Component Value Date/Time   INR 1.1 12/31/2023 1351     Intake/Output Summary (Last 24 hours) at 01/10/2024 0811 Last data filed at 01/10/2024 0600 Gross per 24 hour  Intake 482.29 ml  Output 850 ml  Net -367.71 ml      Assessment/Plan:  83 y.o. male is 2 days post op, s/p: right TCAR   -He continues to do well this morning. He denies any pain. He also walked around  yesterday without any dizziness or lightheadedness -Right sided neck incision intact without hematoma -He denies any strokelike symptoms since surgery -SBPs have improved to the 90s- low 100s yesterday on Sudafed. HR improved to the 50s. He remains asymptomatic. Will d/c sudafed and monitor his vitals throughout the morning. If his HR and BP remain stable off sudafed we will d/c the patient this afternoon   Ahmed Holster, PA-C [facility] [phone] 01/10/2024 8:11 AM

## 2024-01-10 NOTE — Progress Notes (Signed)
 Reviewed AVS, patient expressed understanding of medications, MD follow up reviewed.  Patient educated on low fat, Lean protein diet. Removed IV, Site clean, dry and intact.  Patient states all belongings brought to the hospital at time of admission are accounted for and packed to take home.  Picked up medications from [facility] pharmacy. Pt transported to entrance A where family member was waiting in vehicle to transport home.

## 2024-01-10 NOTE — Discharge Instructions (Signed)
   [facility]  Discharge Instructions   Carotid Surgery  Please refer to the following instructions for your post-procedure care. Your surgeon or physician assistant will discuss any changes with you.  Activity  You are encouraged to walk as much as you can. You can slowly return to normal activities but must avoid strenuous activity and heavy lifting until your doctor tell you it's okay. Avoid activities such as vacuuming or swinging a golf club. You can drive after one week if you are comfortable and you are no longer taking prescription pain medications. It is normal to feel tired for serval weeks after your surgery. It is also normal to have difficulty with sleep habits, eating, and bowel movements after surgery. These will go away with time.  Bathing/Showering  Shower daily after you go home. Do not soak in a bathtub, hot tub, or swim until the incision heals completely.  Incision Care  Shower every day. Clean your incision with mild soap and water. Pat the area dry with a clean towel. You do not need a bandage unless otherwise instructed. Do not apply any ointments or creams to your incision. You may have skin glue on your incision. Do not peel it off. It will come off on its own in about one week. Your incision may feel thickened and raised for several weeks after your surgery. This is normal and the skin will soften over time.   For Men Only: It's okay to shave around the incision but do not shave the incision itself for 2 weeks. It is common to have numbness under your chin that could last for several months.  Diet  Resume your normal diet. There are no special food restrictions following this procedure. A low fat/low cholesterol diet is recommended for all patients with vascular disease. In order to heal from your surgery, it is CRITICAL to get adequate nutrition. Your body requires vitamins, minerals, and protein. Vegetables are the best source of  vitamins and minerals. Vegetables also provide the perfect balance of protein. Processed food has little nutritional value, so try to avoid this.  Medications  Resume taking all of your medications unless your doctor or physician assistant tells you not to. If your incision is causing pain, you may take over-the- counter pain relievers such as acetaminophen  (Tylenol ). If you were prescribed a stronger pain medication, please be aware these medications can cause nausea and constipation. Prevent nausea by taking the medication with a snack or meal. Avoid constipation by drinking plenty of fluids and eating foods with a high amount of fiber, such as fruits, vegetables, and grains.  Do not take Tylenol  if you are taking prescription pain medications.  Follow Up  Our office will schedule a follow up appointment 4 weeks following discharge.  Please call us  immediately for any of the following conditions  Increased pain, redness, drainage (pus) from your incision site. Fever of 101 degrees or higher. If you should develop stroke (slurred speech, difficulty swallowing, weakness on one side of your body, loss of vision) you should call 911 and go to the nearest emergency room.  Reduce your risk of vascular disease:  Stop smoking. If you would like help call [facility] at 1-800-QUIT-NOW (9896827296) or [place] at (316)515-6878. Manage your cholesterol Maintain a desired weight Control your diabetes Keep your blood pressure down  If you have any questions, please call the office at [phone].

## 2024-01-12 ENCOUNTER — Encounter (HOSPITAL_COMMUNITY): Payer: Self-pay | Admitting: Pharmacy Technician

## 2024-01-12 ENCOUNTER — Other Ambulatory Visit: Payer: Self-pay

## 2024-01-12 ENCOUNTER — Emergency Department (HOSPITAL_COMMUNITY)

## 2024-01-12 ENCOUNTER — Emergency Department (HOSPITAL_COMMUNITY)
Admission: EM | Admit: 2024-01-12 | Discharge: 2024-01-12 | Disposition: A | Discharge status: Home / Self Care | Source: Ambulatory Visit

## 2024-01-12 DIAGNOSIS — R531 Weakness: Secondary | ICD-10-CM | POA: Diagnosis not present

## 2024-01-12 DIAGNOSIS — J189 Pneumonia, unspecified organism: Secondary | ICD-10-CM | POA: Diagnosis not present

## 2024-01-12 DIAGNOSIS — Z7982 Long term (current) use of aspirin: Secondary | ICD-10-CM | POA: Insufficient documentation

## 2024-01-12 DIAGNOSIS — E039 Hypothyroidism, unspecified: Secondary | ICD-10-CM | POA: Diagnosis not present

## 2024-01-12 DIAGNOSIS — R918 Other nonspecific abnormal finding of lung field: Secondary | ICD-10-CM | POA: Diagnosis not present

## 2024-01-12 DIAGNOSIS — I2510 Atherosclerotic heart disease of native coronary artery without angina pectoris: Secondary | ICD-10-CM | POA: Diagnosis not present

## 2024-01-12 DIAGNOSIS — Z7901 Long term (current) use of anticoagulants: Secondary | ICD-10-CM | POA: Insufficient documentation

## 2024-01-12 DIAGNOSIS — R001 Bradycardia, unspecified: Secondary | ICD-10-CM | POA: Diagnosis not present

## 2024-01-12 LAB — COMPREHENSIVE METABOLIC PANEL WITH GFR
ALT: 21 U/L (ref 0–44)
AST: 24 U/L (ref 15–41)
Albumin: 3.4 g/dL — ABNORMAL LOW (ref 3.5–5.0)
Alkaline Phosphatase: 53 U/L (ref 38–126)
Anion gap: 9 (ref 5–15)
BUN: 24 mg/dL — ABNORMAL HIGH (ref 8–23)
CO2: 24 mmol/L (ref 22–32)
Calcium: 9.2 mg/dL (ref 8.9–10.3)
Chloride: 106 mmol/L (ref 98–111)
Creatinine, Ser: 1.3 mg/dL — ABNORMAL HIGH (ref 0.61–1.24)
GFR, Estimated: 55 mL/min — ABNORMAL LOW (ref >60)
Glucose, Bld: 106 mg/dL — ABNORMAL HIGH (ref 70–99)
Potassium: 3.9 mmol/L (ref 3.5–5.1)
Sodium: 139 mmol/L (ref 135–145)
Total Bilirubin: 1 mg/dL (ref 0.0–1.2)
Total Protein: 6.3 g/dL — ABNORMAL LOW (ref 6.5–8.1)

## 2024-01-12 LAB — CBC
HCT: 35.8 % — ABNORMAL LOW (ref 39.0–52.0)
Hemoglobin: 12 g/dL — ABNORMAL LOW (ref 13.0–17.0)
MCH: 31.1 pg (ref 26.0–34.0)
MCHC: 33.5 g/dL (ref 30.0–36.0)
MCV: 92.7 fL (ref 80.0–100.0)
Platelets: 172 K/uL (ref 150–400)
RBC: 3.86 MIL/uL — ABNORMAL LOW (ref 4.22–5.81)
RDW: 12.8 % (ref 11.5–15.5)
WBC: 7.4 K/uL (ref 4.0–10.5)
nRBC: 0 % (ref 0.0–0.2)

## 2024-01-12 LAB — URINALYSIS, ROUTINE W REFLEX MICROSCOPIC
Bilirubin Urine: NEGATIVE
Glucose, UA: NEGATIVE mg/dL
Hgb urine dipstick: NEGATIVE
Ketones, ur: NEGATIVE mg/dL
Leukocytes,Ua: NEGATIVE
Nitrite: NEGATIVE
Protein, ur: NEGATIVE mg/dL
Specific Gravity, Urine: 1.02 (ref 1.005–1.030)
pH: 5 (ref 5.0–8.0)

## 2024-01-12 MED ORDER — SODIUM CHLORIDE 0.9 % IV BOLUS
1000.0000 mL | Freq: Once | INTRAVENOUS | Status: AC
  Administered 2024-01-12: 1000 mL via INTRAVENOUS

## 2024-01-12 MED ORDER — SODIUM CHLORIDE 0.9 % IV SOLN
1.0000 g | Freq: Once | INTRAVENOUS | Status: AC
  Administered 2024-01-12: 1 g via INTRAVENOUS
  Filled 2024-01-12: qty 10

## 2024-01-12 MED ORDER — LACTATED RINGERS IV BOLUS
1000.0000 mL | Freq: Once | INTRAVENOUS | Status: AC
  Administered 2024-01-12: 1000 mL via INTRAVENOUS

## 2024-01-12 MED ORDER — AMOXICILLIN 500 MG PO CAPS: 1000.0000 mg | ORAL_CAPSULE | Freq: Two times a day (BID) | ORAL | 0 refills | Status: AC

## 2024-01-12 MED ORDER — BENZONATATE 100 MG PO CAPS: 100.0000 mg | ORAL_CAPSULE | Freq: Three times a day (TID) | ORAL | 0 refills | Status: AC

## 2024-01-12 NOTE — Discharge Instructions (Signed)
 As we discussed, we are treating for pneumonia given symptoms of cough, generalized weakness, a slightly abnormal chest xray and report of low grade fever at home this morning. Please take the prescribed medications and recheck with your doctor this week to ensure you are improving.   Return to the ED with any new or worsening symptoms at any time.

## 2024-01-12 NOTE — ED Provider Notes (Signed)
 [place] [facility] AT [facility] Provider Note   CSN: [id number] Arrival date & time: 01/12/24  1715     Patient presents with: No chief complaint on file.   Jordan Hammond is a 83 y.o. male.   Patient to ED with generalized weakness and fatigue developing over the last 2 days. He had recent hospitalization for stroke s/p carotid revascularization, discharged 7/23 with carotid surgery on 7/30. No complaints of neck pain, hematoma/swelling, stroke symptoms. He feels generally weak. Wife reports coughing all night last night and low grade temperature this morning, Tmax 100.0. No nausea, vomiting. No congestion. He is eating and drinking well.   The history is provided by the patient and the spouse. No language interpreter was used.       Prior to Admission medications   Medication Sig Start Date End Date Taking? Authorizing Provider  amoxicillin  (AMOXIL ) 500 MG capsule Take 2 capsules (1,000 mg total) by mouth 2 (two) times daily. 01/12/24  Yes Cherina Dhillon, Margit, PA-C  benzonatate  (TESSALON ) 100 MG capsule Take 1 capsule (100 mg total) by mouth every 8 (eight) hours. 01/12/24  Yes Odell Margit, PA-C  acetaminophen  (TYLENOL ) 325 MG tablet Take 650 mg by mouth in the morning.    [provider]  Ascorbic Acid (VITAMIN C PO) Take 1 tablet by mouth daily.    [provider]  aspirin  EC 81 MG EC tablet Take 1 tablet (81 mg total) by mouth daily. 02/04/17   Dwan Kyla HERO, PA-C  atorvastatin  (LIPITOR) 20 MG tablet Take 1 tablet (20 mg total) by mouth daily. Patient taking differently: Take 20 mg by mouth at bedtime. 12/28/19   Tobb, Kardie, DO  Calcium  Carb-Cholecalciferol (CALCIUM  + VITAMIN D3 PO) Take 1 tablet by mouth daily.    [provider]  Cholecalciferol (VITAMIN D -3 PO) Take 1 capsule by mouth daily.    [provider]  clonazePAM  (KLONOPIN ) 1 MG tablet Take 1 mg by mouth at bedtime.    [provider]  clopidogrel   (PLAVIX ) 75 MG tablet Take 1 tablet (75 mg total) by mouth daily. 01/02/24   Danford, Lonni SHAUNNA, MD  Coenzyme Q10 (CO Q 10 PO) Take 1 capsule by mouth daily.    [provider]  Cyanocobalamin  (VITAMIN B-12 PO) Take 1 tablet by mouth daily.    [provider]  docusate sodium  (COLACE) 100 MG capsule Take 100-200 mg by mouth See admin instructions. Take 2 capsules (200mg ) by mouth every morning and take 1-2 capsules (100-200mg ) at night as needed for constipation.    [provider]  famotidine  (PEPCID ) 20 MG tablet Take 20 mg by mouth daily as needed for heartburn or indigestion.    [provider]  Flaxseed, Linseed, (FLAX SEED OIL PO) Take 2 capsules by mouth daily.    [provider]  Homeopathic Products (LEG CRAMPS) TABS Take 1 tablet by mouth at bedtime. Magnesium     [provider]  levothyroxine  (SYNTHROID ) 50 MCG tablet Take 50 mcg by mouth daily before breakfast.    [provider]  nitroGLYCERIN  (NITROSTAT ) 0.4 MG SL tablet Place 1 tablet (0.4 mg total) under the tongue every 5 (five) minutes as needed for chest pain. Do not take nitroglycerine within 48 hours of taking viagra 10/30/23   Meng, Hao, PA  OVER THE COUNTER MEDICATION Place 1-2 sprays into the nose daily as needed (allergies). OTC allergy nasal spray    [provider]  oxyCODONE  (ROXICODONE ) 5 MG  immediate release tablet Take 1 tablet (5 mg total) by mouth every 6 (six) hours as needed for severe pain (pain score 7-10). 01/10/24   Schuh, McKenzi P, PA-C  Simethicone  (GAS FREE EXTRA STRENGTH PO) Take 1-2 tablets by mouth daily as needed (gas).    [provider]  traZODone  (DESYREL ) 50 MG tablet Take 50 mg by mouth at bedtime.    [provider]    Allergies: Pravachol [pravastatin], Actiq  [fentanyl ], Ak-mycin [erythromycin], Bayer aspirin  [aspirin ], Motrin [ibuprofen], Nsaids, Relafen [nabumetone], Sulfa antibiotics, Sumycin  [tetracycline], Elemental sulfur, and Sulfamethoxazole    Review of Systems  Updated Vital Signs BP 127/69   Pulse (!) 51   Temp 98.6 F (37 C)   Resp 19   SpO2 100%   Physical Exam Vitals and nursing note reviewed.  Constitutional:      Appearance: Normal appearance. He is normal weight.  HENT:     Head: Normocephalic and atraumatic.  Neck:     Comments: Steri-strips in place to right anterolateral neck without drainage. No surrounding swelling. No bruit.  Cardiovascular:     Rate and Rhythm: Regular rhythm. Bradycardia present.  Pulmonary:     Effort: Pulmonary effort is normal.     Breath sounds: No wheezing, rhonchi or rales.  Abdominal:     General: There is no distension.     Palpations: Abdomen is soft.     Tenderness: There is no abdominal tenderness.  Musculoskeletal:        General: Normal range of motion.     Right lower leg: No edema.     Left lower leg: No edema.  Skin:    General: Skin is warm and dry.  Neurological:     Mental Status: He is alert and oriented to person, place, and time.     Sensory: No sensory deficit.     Motor: No weakness.     (all labs ordered are listed, but only abnormal results are displayed) Labs Reviewed  COMPREHENSIVE METABOLIC PANEL WITH GFR - Abnormal; Notable for the following components:      Result Value   Glucose, Bld 106 (*)    BUN 24 (*)    Creatinine, Ser 1.30 (*)    Total Protein 6.3 (*)    Albumin  3.4 (*)    GFR, Estimated 55 (*)    All other components within normal limits  CBC - Abnormal; Notable for the following components:   RBC 3.86 (*)    Hemoglobin 12.0 (*)    HCT 35.8 (*)    All other components within normal limits  URINALYSIS, ROUTINE W REFLEX MICROSCOPIC    EKG: EKG Interpretation Date/Time:  Sunday January 12 2024 17:26:26 EDT Ventricular Rate:  58 PR Interval:  166 QRS Duration:  92 QT Interval:  428 QTC Calculation: 420 R Axis:   38  Text Interpretation: Sinus bradycardia  Nonspecific ST abnormality Abnormal ECG When compared with ECG of 31-Dec-2023 14:18, PREVIOUS ECG IS PRESENT Confirmed by Neysa Clap [phone]) on 01/12/2024 5:37:07 PM  Radiology: DG Chest Portable 1 View Result Date: 01/12/2024 CLINICAL DATA:  Weakness. EXAM: PORTABLE CHEST 1 VIEW COMPARISON:  06/22/2023. FINDINGS: The heart size and mediastinal contours are within normal limits. Prior median sternotomy, CABG, and aortic valve replacement. Streaky opacities at the left mid lung zone could reflect atelectasis or infiltrate. Right lung appears clear. No sizable pleural effusion or pneumothorax. No acute osseous abnormality. IMPRESSION: Streaky opacities at the left mid lung zone could reflect atelectasis or  infiltrate. Electronically Signed   By: Harrietta Sherry M.D.   On: 01/12/2024 18:49     Procedures   Medications Ordered in the ED  lactated ringers  bolus 1,000 mL (0 mLs Intravenous Stopped 01/12/24 2053)  cefTRIAXone  (ROCEPHIN ) 1 g in sodium chloride  0.9 % 100 mL IVPB (1 g Intravenous New Bag/Given 01/12/24 2056)  sodium chloride  0.9 % bolus 1,000 mL (1,000 mLs Intravenous New Bag/Given 01/12/24 2055)                                    Medical Decision Making This patient presents to the ED for concern of generalized weakness, this involves an extensive number of treatment options, and is a complaint that carries with it a high risk of complications and morbidity.  The differential diagnosis includes sepsis/infection,    Co morbidities that complicate the patient evaluation  CVA, CAD, HLD, GERD, hypothyroidism   Additional history obtained:  Additional history and/or information obtained from chart review, notable for Recent admission notes reviewed   Lab Tests:  I Ordered, and personally interpreted labs.  The pertinent results include:   Results for orders placed or performed during the hospital encounter of 01/12/24 -Comprehensive metabolic panel:  Collection Time: 01/12/24   5:27 PM      Result                      Value             Ref Range          Sodium                      139               135 - 145 mm*      Potassium                   3.9               3.5 - 5.1 mm*      Chloride                    106               98 - 111 mmo*      CO2                         24                22 - 32 mmol*      Glucose, Bld                106 (H)           70 - 99 mg/dL      BUN                         24 (H)            8 - 23 mg/dL       Creatinine, Ser             1.30 (H)          0.61 - 1.24 *      Calcium   9.2               8.9 - 10.3 m*      Total Protein               6.3 (L)           6.5 - 8.1 g/*      Albumin                      3.4 (L)           3.5 - 5.0 g/*      AST                         24                15 - 41 U/L        ALT                         21                0 - 44 U/L         Alkaline Phosphatase        53                38 - 126 U/L       Total Bilirubin             1.0               0.0 - 1.2 mg*      GFR, Estimated              55 (L)            >60 mL/min         Anion gap                   9                 5 - 15        -CBC:  Collection Time: 01/12/24  5:27 PM      Result                      Value             Ref Range          WBC                         7.4               4.0 - 10.5 K*      RBC                         3.86 (L)          4.22 - 5.81 *      Hemoglobin                  12.0 (L)          13.0 - 17.0 *      HCT                         35.8 (L)          39.0 - 52.0 %  MCV                         92.7              80.0 - 100.0*      MCH                         31.1              26.0 - 34.0 *      MCHC                        33.5              30.0 - 36.0 *      RDW                         12.8              11.5 - 15.5 %      Platelets                   172               150 - 400 K/*      nRBC                        0.0               0.0 - 0.2 %   -Urinalysis, Routine w reflex microscopic  -Urine, Clean Catch:  Collection Time: 01/12/24  5:27 PM      Result                      Value             Ref Range          Color, Urine                YELLOW            YELLOW             APPearance                  CLEAR             CLEAR              Specific Gravity, Urine     1.020             1.005 - 1.030      pH                          5.0               5.0 - 8.0          Glucose, UA                 NEGATIVE          NEGATIVE mg/*      Hgb urine dipstick          NEGATIVE          NEGATIVE           Bilirubin Urine             NEGATIVE  NEGATIVE           Ketones, ur                 NEGATIVE          NEGATIVE mg/*      Protein, ur                 NEGATIVE          NEGATIVE mg/*      Nitrite                     NEGATIVE          NEGATIVE           Leukocytes,Ua               NEGATIVE          NEGATIVE          Imaging Studies ordered:  I ordered imaging studies including CXR Per radiologist interpretation: IMPRESSION: Streaky opacities at the left mid lung zone could reflect atelectasis or infiltrate.    Cardiac Monitoring:  The patient was maintained on a cardiac monitor.  I personally viewed and interpreted the cardiac monitored which showed an underlying rhythm of:  EKG Interpretation Date/Time:  Sunday January 12 2024 17:26:26 EDT Ventricular Rate:  58 PR Interval:  166 QRS Duration:  92 QT Interval:  428 QTC Calculation: 420 R Axis:   38  Text Interpretation: Sinus bradycardia Nonspecific ST abnormality Abnormal ECG When compared with ECG of 31-Dec-2023 14:18, PREVIOUS ECG IS PRESENT Confirmed by Young, Travis ([id number]) on 01/12/2024 5:37:07 PM    Test Considered:  N/a   Critical Interventions:  N/a   Consultations Obtained:  I requested consultation with the n/a,  and discussed lab and imaging findings as well as pertinent plan - they recommend: n/a   Problem List / ED Course:  Here with generalized weakness, concern for recent carotid  surgery No ss/sxs of complication from recent procedure Wife reports cough, low grade temp today. VSS here are stable, mildly hypotensive on arrival.  IV fluids provided with improvement in blood pressure.  CXR soft call for left PNA. Given cough, report of low grade temp, generalized weakness, antibiotics were started for pneumonia.   After treatments in the ED, the patient was ambulated and was independent, steady. He reports feeling improved. Discussed discharge home vs inpatient treatment and patient reports he feels confident going home. Abx, Tessalon provided. Strict return precautions discussed.    Reevaluation:  After the interventions noted above, I reevaluated the patient and found that they have :improved   Social Determinants of Health:  Never a smoker   Disposition:  After consideration of the diagnostic results and the patients response to treatment, I feel that the patient would benefit from discharge home. .   Amount and/or Complexity of Data Reviewed Labs: ordered. Radiology: ordered.  Risk Prescription drug management.        Final diagnoses:  Community acquired pneumonia, unspecified laterality    ED Discharge Orders          Ordered    benzonatate (TESSALON) 100 MG capsule  Every 8 hours        01/12/24 2258    amoxicillin (AMOXIL) 500 MG capsule  2 times daily        08 /03/25 2258               Odell Balls, PA-C 01/12/24 2313    Neysa Clap  J, DO 01/13/24 [id number]

## 2024-01-12 NOTE — ED Triage Notes (Signed)
 PT had carotid stent placed 3 days ago and today is feeling weak and tired with low bp 95/56. Called doctor and was advised to come to ED.

## 2024-01-13 ENCOUNTER — Other Ambulatory Visit: Payer: Self-pay

## 2024-01-13 DIAGNOSIS — I6523 Occlusion and stenosis of bilateral carotid arteries: Secondary | ICD-10-CM

## 2024-01-15 DIAGNOSIS — I639 Cerebral infarction, unspecified: Secondary | ICD-10-CM | POA: Diagnosis not present

## 2024-01-16 DIAGNOSIS — J189 Pneumonia, unspecified organism: Secondary | ICD-10-CM | POA: Diagnosis not present

## 2024-01-16 DIAGNOSIS — Z9889 Other specified postprocedural states: Secondary | ICD-10-CM | POA: Diagnosis not present

## 2024-01-16 NOTE — Discharge Summary (Signed)
 Discharge Summary     Jordan Hammond April 30, 1941 83 y.o. male  [id number]  Admission Date: 01/08/2024  Discharge Date: 01/10/2024  Physician: Debby Robertson, MD  Admission Diagnosis: Acute ischemic stroke [facility]) [I63.9] Carotid artery stenosis [I65.29]  Discharge Day Diagnosis: Acute ischemic stroke [facility]) [I63.9] Carotid artery stenosis [facility]  Hospital Course:  The patient was admitted to the hospital and taken to the operating room on 01/08/2024 and underwent right TCAR.  The pt tolerated the procedure well and was transported to the PACU in good condition.  By POD 1, the pt neuro status was intact.  He had no recurrent neurological symptoms concerning for stroke.  His hemoglobin was stable at 11.2.  He was tolerating a normal diet without swallowing difficulty.  He did have some postoperative bradycardia and hypotension, which was asymptomatic.  He had no dizziness or weakness.  He was started on Sudafed 30 mg every 6 hours to improve his blood pressure.  He was kept overnight again for monitoring.  On POD 2 he continued to feel well.  He was still slightly hypotensive with systolic blood pressures in the high 90s to low 100s.  Heart rate improved to the 50s.  He remained asymptomatic.  He had no dizziness or weakness.  He was mobilizing without any difficulty.  We discontinued his Sudafed and monitored him until the afternoon.  His vitals remained stable and he remained asymptomatic.  We discharged the patient home on the afternoon of POD 2.  We encouraged the patient to keep an eye on his blood pressure and heart rate at home.  The remainder of the hospital course consisted of increasing mobilization and increasing intake of solids without difficulty.   No results for input(s): NA, K, CL, CO2, GLUCOSE, BUN, CALCIUM  in the last 72 hours.  Invalid input(s): CRATININE No results for input(s): WBC, HGB, HCT, PLT in the last 72 hours. No results for  input(s): INR in the last 72 hours.   Discharge Instructions     Call MD for:  redness, tenderness, or signs of infection (pain, swelling, redness, odor or green/yellow discharge around incision site)   Complete by: As directed    Call MD for:  severe uncontrolled pain   Complete by: As directed    Call MD for:  temperature >100.4   Complete by: As directed    Diet - low sodium heart healthy   Complete by: As directed    Discharge wound care:   Complete by: As directed    Wash your neck incision daily with soap and water, then pat dry   Increase activity slowly   Complete by: As directed        Discharge Diagnosis:  Acute ischemic stroke [facility]) [I63.9] Carotid artery stenosis [I65.29]  Secondary Diagnosis: Patient Active Problem List   Diagnosis Date Noted   Carotid artery stenosis 01/08/2024   Acute CVA (cerebrovascular accident) (HCC) 12/31/2023   AKI (acute kidney injury) (HCC) 04/24/2022   Hypothyroidism 04/24/2022   Heart murmur 06/12/2018   Low back pain 06/12/2018   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   Chronic kidney disease 12/16/2017   Hyperlipidemia 02/21/2017   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   Chest pain    Bradycardia 01/20/2017   Restless leg syndrome 01/20/2017   Aortic stenosis 01/20/2017   CAD (coronary artery disease), native coronary artery 01/20/2017   Aortic atherosclerosis (HCC) 01/20/2017   Cramp in lower leg associated with rest 03/05/2016   Colon polyps  08/05/2015   Encounter for screening colonoscopy 05/27/2015   Contusion of left knee 09/08/2014   PUD (peptic ulcer disease) 04/22/2012   S/P Nissen fundoplication (without gastrostomy tube) procedure 04/22/2012   Choking 03/12/2012   Dysphagia 03/12/2012   GERD (gastroesophageal reflux disease) 03/12/2012   Hoarseness of voice 03/12/2012   Gross hematuria 11/11/2009   Prostatitis 11/10/2009   Ureteric stone 11/10/2009   Arthropathy 10/07/2009   Past  Medical History:  Diagnosis Date   Aortic atherosclerosis (HCC) 01/20/2017   Aortic stenosis 01/20/2017   Arthritis    CAD (coronary artery disease), native coronary artery 01/20/2017   GERD (gastroesophageal reflux disease)    Hiatal hernia    History of kidney stones    Hyperlipidemia    Hypothyroidism    Restless leg syndrome    Stroke (HCC)     Allergies as of 01/10/2024       Reactions   Pravachol [pravastatin] Nausea And Vomiting   Actiq  [fentanyl ] Other (See Comments)   Unknown reaction   Ak-mycin [erythromycin] Nausea And Vomiting   Bayer Aspirin  [aspirin ] Nausea And Vomiting   High doses only - OK with low dose QD   Motrin [ibuprofen] Nausea And Vomiting   Nsaids Nausea And Vomiting   Relafen [nabumetone] Nausea And Vomiting   Sulfa Antibiotics Other (See Comments)   Unknown reaction   Sumycin [tetracycline] Nausea And Vomiting   Elemental Sulfur Itching, Swelling, Rash   Sulfamethoxazole Itching, Swelling, Rash        Medication List     TAKE these medications    acetaminophen  325 MG tablet Commonly known as: TYLENOL  Take 650 mg by mouth in the morning.   aspirin  EC 81 MG tablet Take 1 tablet (81 mg total) by mouth daily.   atorvastatin  20 MG tablet Commonly known as: LIPITOR Take 1 tablet (20 mg total) by mouth daily. What changed: when to take this   CALCIUM  + VITAMIN D3 PO Take 1 tablet by mouth daily.   clonazePAM  1 MG tablet Commonly known as: KLONOPIN  Take 1 mg by mouth at bedtime.   clopidogrel  75 MG tablet Commonly known as: PLAVIX  Take 1 tablet (75 mg total) by mouth daily.   CO Q 10 PO Take 1 capsule by mouth daily.   docusate sodium  100 MG capsule Commonly known as: COLACE Take 100-200 mg by mouth See admin instructions. Take 2 capsules (200mg ) by mouth every morning and take 1-2 capsules (100-200mg ) at night as needed for constipation.   famotidine  20 MG tablet Commonly known as: PEPCID  Take 20 mg by mouth daily as needed  for heartburn or indigestion.   FLAX SEED OIL PO Take 2 capsules by mouth daily.   GAS FREE EXTRA STRENGTH PO Take 1-2 tablets by mouth daily as needed (gas).   Leg Cramps Tabs Take 1 tablet by mouth at bedtime. Magnesium    levothyroxine  50 MCG tablet Commonly known as: SYNTHROID  Take 50 mcg by mouth daily before breakfast.   nitroGLYCERIN  0.4 MG SL tablet Commonly known as: NITROSTAT  Place 1 tablet (0.4 mg total) under the tongue every 5 (five) minutes as needed for chest pain. Do not take nitroglycerine within 48 hours of taking viagra   OVER THE COUNTER MEDICATION Place 1-2 sprays into the nose daily as needed (allergies). OTC allergy nasal spray   oxyCODONE  5 MG immediate release tablet Commonly known as: Roxicodone  Take 1 tablet (5 mg total) by mouth every 6 (six) hours as needed for severe pain (pain score 7-10).  traZODone  50 MG tablet Commonly known as: DESYREL  Take 50 mg by mouth at bedtime.   VITAMIN B-12 PO Take 1 tablet by mouth daily.   VITAMIN C PO Take 1 tablet by mouth daily.   VITAMIN D -3 PO Take 1 capsule by mouth daily.               Discharge Care Instructions  (From admission, onward)           Start     Ordered   01/10/24 0000  Discharge wound care:       Comments: Wash your neck incision daily with soap and water, then pat dry   01/10/24 1413             Discharge Instructions:   [facility] Discharge Instructions Carotid Revascularization  Please refer to the following instructions for your post-procedure care. Your surgeon or physician assistant will discuss any changes with you.  Activity  You are encouraged to walk as much as you can. You can slowly return to normal activities but must avoid strenuous activity and heavy lifting until your doctor tell you it's OK. Avoid activities such as vacuuming or swinging a golf club. You can drive after one week if you are comfortable and you  are no longer taking prescription pain medications. It is normal to feel tired for serval weeks after your surgery. It is also normal to have difficulty with sleep habits, eating, and bowel movements after surgery. These will go away with time.  Bathing/Showering  You may shower after you come home. Do not soak in a bathtub, hot tub, or swim until the incision heals completely.  Incision Care  Shower every day. Clean your incision with mild soap and water. Pat the area dry with a clean towel. You do not need a bandage unless otherwise instructed. Do not apply any ointments or creams to your incision. You may have skin glue on your incision. Do not peel it off. It will come off on its own in about one week. Your incision may feel thickened and raised for several weeks after your surgery. This is normal and the skin will soften over time. For Men Only: It's OK to shave around the incision but do not shave the incision itself for 2 weeks. It is common to have numbness under your chin that could last for several months.  Diet  Resume your normal diet. There are no special food restrictions following this procedure. A low fat/low cholesterol diet is recommended for all patients with vascular disease. In order to heal from your surgery, it is CRITICAL to get adequate nutrition. Your body requires vitamins, minerals, and protein. Vegetables are the best source of vitamins and minerals. Vegetables also provide the perfect balance of protein. Processed food has little nutritional value, so try to avoid this.  Medications  Resume taking all of your medications unless your doctor or physician assistant tells you not to.  If your incision is causing pain, you may take over-the- counter pain relievers such as acetaminophen  (Tylenol ). If you were prescribed a stronger pain medication, please be aware these medications can cause nausea and constipation.  Prevent nausea by taking the medication with a snack or meal.  Avoid constipation by drinking plenty of fluids and eating foods with a high amount of fiber, such as fruits, vegetables, and grains. Do not take Tylenol  if you are taking prescription pain medications.  Follow Up  Our office will schedule a  follow up appointment 2-3 weeks following discharge.  Please call us  immediately for any of the following conditions  Increased pain, redness, drainage (pus) from your incision site. Fever of 101 degrees or higher. If you should develop stroke (slurred speech, difficulty swallowing, weakness on one side of your body, loss of vision) you should call 911 and go to the nearest emergency room.  Reduce your risk of vascular disease:  Stop smoking. If you would like help call [facility] at 1-800-QUIT-NOW (437-638-7019) or [place] at (737)585-1022. Manage your cholesterol Maintain a desired weight Control your diabetes Keep your blood pressure down  If you have any questions, please call the office at [phone].  Disposition: Home  Patient's condition: is Good  Follow up: 1. Dr.Hawken in 4 weeks.   Ahmed Holster, PA-C [facility] [phone]   --- For [facility] Registry use ---   Modified Rankin score at D/C (0-6): 0  IV medication needed for:  1. Hypertension: No 2. Hypotension: No  Post-op Complications: No  1. Post-op CVA or TIA: No  If yes: Event classification (right eye, left eye, right cortical, left cortical, verterobasilar, other):   If yes: Timing of event (intra-op, <6 hrs post-op, >=6 hrs post-op, unknown):   2. CN injury: No  If yes: CN  injuried   3. Myocardial infarction: No  If yes: Dx by (EKG or clinical, Troponin):   4.  CHF: No  5.  Dysrhythmia (new): No  6. Wound infection: No  7. Reperfusion symptoms: No  8. Return to OR: No  If yes: return to OR for (bleeding, neurologic, other CEA incision, other):   Discharge medications: Statin use:  Yes ASA use:  Yes   Beta blocker  use:  No ACE-Inhibitor use:  No  ARB use:  No CCB use: No P2Y12 Antagonist use: Yes, [ x] Plavix , [ ]  Plasugrel, [ ]  Ticlopinine, [ ]  Ticagrelor, [ ]  Other, [ ]  No for medical reason, [ ]  Non-compliant, [ ]  Not-indicated Anti-coagulant use:  No, [ ]  Warfarin, [ ]  Rivaroxaban, [ ]  Dabigatran,

## 2024-01-17 ENCOUNTER — Ambulatory Visit: Admitting: Speech Pathology

## 2024-01-17 ENCOUNTER — Ambulatory Visit: Admitting: Occupational Therapy

## 2024-01-21 ENCOUNTER — Other Ambulatory Visit: Payer: Self-pay

## 2024-01-21 ENCOUNTER — Ambulatory Visit: Admitting: Speech Pathology

## 2024-01-21 ENCOUNTER — Ambulatory Visit: Admitting: Physical Therapy

## 2024-01-21 MED ORDER — CLOPIDOGREL BISULFATE 75 MG PO TABS: 75.0000 mg | ORAL_TABLET | Freq: Every day | ORAL | 0 refills | Status: DC

## 2024-01-22 ENCOUNTER — Other Ambulatory Visit: Payer: Self-pay

## 2024-01-22 ENCOUNTER — Encounter: Payer: Self-pay | Admitting: Physical Therapy

## 2024-01-22 ENCOUNTER — Ambulatory Visit: Attending: Family Medicine | Admitting: Physical Therapy

## 2024-01-22 DIAGNOSIS — M6281 Muscle weakness (generalized): Secondary | ICD-10-CM | POA: Insufficient documentation

## 2024-01-22 DIAGNOSIS — R2990 Unspecified symptoms and signs involving the nervous system: Secondary | ICD-10-CM | POA: Insufficient documentation

## 2024-01-22 DIAGNOSIS — R262 Difficulty in walking, not elsewhere classified: Secondary | ICD-10-CM | POA: Insufficient documentation

## 2024-01-22 DIAGNOSIS — R2681 Unsteadiness on feet: Secondary | ICD-10-CM | POA: Insufficient documentation

## 2024-01-22 NOTE — Therapy (Signed)
 OUTPATIENT PHYSICAL THERAPY NEURO EVALUATION   Patient Name: Jordan Hammond MRN: [id number] DOB:11-21-1940, 83 y.o., male Today's Date: 01/22/2024   PCP: Mercer, NP REFERRING PROVIDER: Jonel, MD  END OF SESSION:  PT End of Session - 01/22/24 1751     Visit Number 1    Date for PT Re-Evaluation 04/23/24    Authorization Type [employer]    PT Start Time 1750    PT Stop Time 1830    PT Time Calculation (min) 40 min    Activity Tolerance Patient tolerated treatment well    Behavior During Therapy [facility] for tasks assessed/performed          Past Medical History:  Diagnosis Date   Aortic atherosclerosis (HCC) 01/20/2017   Aortic stenosis 01/20/2017   Arthritis    CAD (coronary artery disease), native coronary artery 01/20/2017   GERD (gastroesophageal reflux disease)    Hiatal hernia    History of kidney stones    Hyperlipidemia    Hypothyroidism    Restless leg syndrome    Stroke [facility])    Past Surgical History:  Procedure Laterality Date   AORTIC VALVE REPLACEMENT N/A 01/29/2017   Procedure: AORTIC VALVE REPLACEMENT (AVR);  Surgeon: Fleeta Ochoa, Maude, MD;  Location[facility] OR;  Service: Open Heart Surgery;  Laterality: N/A;   APPENDECTOMY     BLEPHAROPLASTY Bilateral    bowel obstruction surgery     CATARACT EXTRACTION Bilateral 2018   CHOLECYSTECTOMY     CORONARY ARTERY BYPASS GRAFT N/A 01/29/2017   Procedure: CORONARY ARTERY BYPASS GRAFTING (CABG)  x two, using left internal mammary artery and right leg greater saphenous vein harvested endoscopically;  Surgeon: Fleeta Ochoa Maude, MD;  Location: [facility] OR;  Service: Open Heart Surgery;  Laterality: N/A;   KNEE ARTHROSCOPY     lap nissan     LEFT HEART CATH AND CORONARY ANGIOGRAPHY N/A 01/21/2017   Procedure: LEFT HEART CATH AND CORONARY ANGIOGRAPHY;  Surgeon: Court Dorn PARAS, MD;  Location: [facility] INVASIVE CV LAB;  Service: Cardiovascular;  Laterality: N/A;   LEFT HEART CATH AND CORS/GRAFTS ANGIOGRAPHY N/A 11/06/2023    Procedure: LEFT HEART CATH AND CORS/GRAFTS ANGIOGRAPHY;  Surgeon: [place], Peter M, MD;  Location: [facility] INVASIVE CV LAB;  Service: Cardiovascular;  Laterality: N/A;   RIGHT HEART CATH N/A 01/21/2017   Procedure: RIGHT HEART CATH;  Surgeon: Court Dorn PARAS, MD;  Location: [facility] INVASIVE CV LAB;  Service: Cardiovascular;  Laterality: N/A;   SHOULDER SURGERY     TEE WITHOUT CARDIOVERSION N/A 01/29/2017   Procedure: TRANSESOPHAGEAL ECHOCARDIOGRAM (TEE);  Surgeon: Fleeta Ochoa, Maude, MD;  Location: [facility] OR;  Service: Open Heart Surgery;  Laterality: N/A;   TRANSCAROTID ARTERY REVASCULARIZATION  Right 01/08/2024   Procedure: TRANSCAROTID ARTERY REVASCULARIZATION (TCAR);  Surgeon: Magda Debby SAILOR, MD;  Location: [facility] OR;  Service: Vascular;  Laterality: Right;   Patient Active Problem List   Diagnosis Date Noted   Carotid artery stenosis 01/08/2024   Acute CVA (cerebrovascular accident) (HCC) 12/31/2023   AKI (acute kidney injury) (HCC) 04/24/2022   Hypothyroidism 04/24/2022   Heart murmur 06/12/2018   Low back pain 06/12/2018   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   Chronic kidney disease 12/16/2017   Hyperlipidemia 02/21/2017   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   Chest pain    Bradycardia 01/20/2017   Restless leg syndrome 01/20/2017   Aortic stenosis 01/20/2017   CAD (coronary artery disease), native coronary artery 01/20/2017   Aortic atherosclerosis (HCC) 01/20/2017  Cramp in lower leg associated with rest 03/05/2016   Colon polyps 08/05/2015   Encounter for screening colonoscopy 05/27/2015   Contusion of left knee 09/08/2014   PUD (peptic ulcer disease) 04/22/2012   S/P Nissen fundoplication (without gastrostomy tube) procedure 04/22/2012   Choking 03/12/2012   Dysphagia 03/12/2012   GERD (gastroesophageal reflux disease) 03/12/2012   Hoarseness of voice 03/12/2012   Gross hematuria 11/11/2009   Prostatitis 11/10/2009   Ureteric stone 11/10/2009   Arthropathy  10/07/2009    ONSET DATE: 12/31/23  REFERRING DIAG: CVA  THERAPY DIAG:  Muscle weakness (generalized)  Difficulty in walking, not elsewhere classified  Unsteadiness on feet  Rationale for Evaluation and Treatment: Rehabilitation  SUBJECTIVE:                                                                                                                                                                                             SUBJECTIVE STATEMENT: 83 yo male presents to [facility] on 12/31/23 as a code stroke for L sided weakness and word finding difficulty. 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 REports that he feels that he is very weak.  Would like to walk and be able to go up steps.   Pt accompanied by: self  PERTINENT HISTORY: CABG x 2, HLD, AVR, GERD, CAD, hypothyroidism, anxiety.   PAIN:  Are you having pain? No  PRECAUTIONS: None  RED FLAGS: None   WEIGHT BEARING RESTRICTIONS: No  FALLS: Has patient fallen in last 6 months? Yes. Number of falls 1  LIVING ENVIRONMENT: Lives with: lives with their family Lives in: House/apartment Stairs: No Has following equipment at home: None  PLOF: Independent lives in apartment, grocery shopping, walking 30 minutes 3-4 x/week,   PATIENT GOALS: alk better, feel stronger  OBJECTIVE:  Note: Objective measures were completed at Evaluation unless otherwise noted.  DIAGNOSTIC FINDINGS: see chart  COGNITION: Overall cognitive status: Within functional limits for tasks assessed and reports some memory recall   SENSATION: WFL  MUSCLE LENGTH: Mild tightness in the HS and calf POSTURE: rounded shoulders, forward head, and decreased lumbar lordosis  LOWER EXTREMITY ROM:   WFL's   LOWER EXTREMITY MMT:    MMT Right Eval Left Eval  Hip flexion 4 4-  Hip extension    Hip abduction    Hip adduction    Hip internal rotation    Hip external rotation    Knee flexion 4+ 4-  Knee extension  4 4-  Ankle dorsiflexion 4 4  Ankle plantarflexion  3+  Ankle inversion  4-  Ankle eversion  3+  (Blank  rows = not tested)  TRANSFERS: Fatigues easily, uses hands at times to push up from chair  GAIT: Reports that he he had some left foot drag the past two weeks but feels it is better, I did not notice it today  FUNCTIONAL TESTS:  5 times sit to stand: 20 s Timed up and go (TUG): 12 s BERG:  48/56                                                                                                                              TREATMENT DATE:  01/22/24 Evaluation    PATIENT EDUCATION: Education details: POC Person educated: Patient Education method: Explanation Education comprehension: verbalized understanding  HOME EXERCISE PROGRAM: TBD  GOALS: Goals reviewed with patient? Yes  SHORT TERM GOALS: Target date: 02/20/24  Independent with initial HEP Baseline: Goal status: INITIAL  LONG TERM GOALS: Target date: 04/23/24  Independent with advanced HEP Baseline:  Goal status: INITIAL  2.  Increase Berg to 50/56 Baseline:  Goal status: INITIAL  3.  Increase left knee strength to 4+ Baseline:  Goal status: INITIAL  ASSESSMENT:  CLINICAL IMPRESSION: Patient is a 83 y.o. male who was seen today for physical therapy evaluation and treatment for CVA, some residual left sided weakness, he is improving but has some issues with higher level balance activities.  [facility] and will have to go up steps to get to the track, he reports that he has been afraid to try this   OBJECTIVE IMPAIRMENTS: Abnormal gait, cardiopulmonary status limiting activity, decreased activity tolerance, decreased balance, decreased coordination, decreased endurance, decreased mobility, difficulty walking, decreased ROM, decreased strength, decreased safety awareness, impaired flexibility, improper body mechanics, postural dysfunction, and pain.   REHAB POTENTIAL: Good  CLINICAL DECISION  MAKING: Evolving/moderate complexity  EVALUATION COMPLEXITY: Moderate  PLAN:  PT FREQUENCY: 1-2x/week  PT DURATION: 12 weeks  PLANNED INTERVENTIONS: 97164- PT Re-evaluation, 97110-Therapeutic exercises, 97530- Therapeutic activity, 97112- Neuromuscular re-education, 97535- Self Care, [id number]- Manual therapy, [phone]- Gait training, [phone]- Electrical stimulation (unattended), Patient/Family education, Balance training, Stair training, Vestibular training, Visual/preceptual remediation/compensation, Cryotherapy, and Moist heat  PLAN FOR NEXT SESSION: strength and balance   Babe Clenney W, PT 01/22/2024, 5:52 PM

## 2024-01-31 ENCOUNTER — Ambulatory Visit: Admitting: Speech Pathology

## 2024-02-04 DIAGNOSIS — E782 Mixed hyperlipidemia: Secondary | ICD-10-CM | POA: Diagnosis not present

## 2024-02-04 DIAGNOSIS — R413 Other amnesia: Secondary | ICD-10-CM | POA: Diagnosis not present

## 2024-02-04 DIAGNOSIS — E039 Hypothyroidism, unspecified: Secondary | ICD-10-CM | POA: Diagnosis not present

## 2024-02-04 DIAGNOSIS — D649 Anemia, unspecified: Secondary | ICD-10-CM | POA: Diagnosis not present

## 2024-02-04 DIAGNOSIS — N1831 Chronic kidney disease, stage 3a: Secondary | ICD-10-CM | POA: Diagnosis not present

## 2024-02-11 ENCOUNTER — Other Ambulatory Visit (HOSPITAL_COMMUNITY): Payer: Self-pay

## 2024-02-11 DIAGNOSIS — Z0000 Encounter for general adult medical examination without abnormal findings: Secondary | ICD-10-CM | POA: Diagnosis not present

## 2024-02-11 DIAGNOSIS — Z9889 Other specified postprocedural states: Secondary | ICD-10-CM | POA: Diagnosis not present

## 2024-02-11 DIAGNOSIS — E039 Hypothyroidism, unspecified: Secondary | ICD-10-CM | POA: Diagnosis not present

## 2024-02-11 DIAGNOSIS — E785 Hyperlipidemia, unspecified: Secondary | ICD-10-CM | POA: Diagnosis not present

## 2024-02-11 DIAGNOSIS — L989 Disorder of the skin and subcutaneous tissue, unspecified: Secondary | ICD-10-CM | POA: Diagnosis not present

## 2024-02-11 DIAGNOSIS — D649 Anemia, unspecified: Secondary | ICD-10-CM | POA: Diagnosis not present

## 2024-02-12 ENCOUNTER — Ambulatory Visit (HOSPITAL_COMMUNITY)
Admission: RE | Admit: 2024-02-12 | Discharge: 2024-02-12 | Disposition: A | Discharge status: Home / Self Care | Source: Ambulatory Visit | Attending: Vascular Surgery | Admitting: Vascular Surgery

## 2024-02-12 DIAGNOSIS — I6523 Occlusion and stenosis of bilateral carotid arteries: Secondary | ICD-10-CM | POA: Diagnosis not present

## 2024-02-14 ENCOUNTER — Encounter: Payer: Self-pay | Admitting: Speech Pathology

## 2024-02-14 ENCOUNTER — Ambulatory Visit: Attending: Family Medicine | Admitting: Physical Therapy

## 2024-02-14 ENCOUNTER — Ambulatory Visit: Admitting: Speech Pathology

## 2024-02-14 DIAGNOSIS — R41841 Cognitive communication deficit: Secondary | ICD-10-CM

## 2024-02-14 DIAGNOSIS — R2681 Unsteadiness on feet: Secondary | ICD-10-CM | POA: Insufficient documentation

## 2024-02-14 DIAGNOSIS — R262 Difficulty in walking, not elsewhere classified: Secondary | ICD-10-CM | POA: Insufficient documentation

## 2024-02-14 DIAGNOSIS — M6281 Muscle weakness (generalized): Secondary | ICD-10-CM | POA: Diagnosis not present

## 2024-02-14 NOTE — Therapy (Signed)
 OUTPATIENT SPEECH LANGUAGE PATHOLOGY EVALUATION   Patient Name: Jordan Hammond MRN: [id number] DOB:[birth year], 83 y.o., male Today's Date: 02/14/2024  PCP: Jordan Garnette Barter, FNP  REFERRING PROVIDER: Jonel Lonni SHAUNNA, MD  END OF SESSION:  End of Session - 02/14/24 0930     Visit Number 1    SLP Start Time 0930    SLP Stop Time  1010    SLP Time Calculation (min) 40 min    Activity Tolerance Patient tolerated treatment well          Past Medical History:  Diagnosis Date   Aortic atherosclerosis (HCC) 01/20/2017   Aortic stenosis 01/20/2017   Arthritis    CAD (coronary artery disease), native coronary artery 01/20/2017   GERD (gastroesophageal reflux disease)    Hiatal hernia    History of kidney stones    Hyperlipidemia    Hypothyroidism    Restless leg syndrome    Stroke [facility])    Past Surgical History:  Procedure Laterality Date   AORTIC VALVE REPLACEMENT N/A 01/29/2017   Procedure: AORTIC VALVE REPLACEMENT (AVR);  Surgeon: Jordan Ochoa, Maude, MD;  Location: [facility] OR;  Service: Open Heart Surgery;  Laterality: N/A;   APPENDECTOMY     BLEPHAROPLASTY Bilateral    bowel obstruction surgery     CATARACT EXTRACTION Bilateral 2018   CHOLECYSTECTOMY     CORONARY ARTERY BYPASS GRAFT N/A 01/29/2017   Procedure: CORONARY ARTERY BYPASS GRAFTING (CABG)  x two, using left internal mammary artery and right leg greater saphenous vein harvested endoscopically;  Surgeon: Jordan Ochoa Maude, MD;  Location: [facility] OR;  Service: Open Heart Surgery;  Laterality: N/A;   KNEE ARTHROSCOPY     lap nissan     LEFT HEART CATH AND CORONARY ANGIOGRAPHY N/A 01/21/2017   Procedure: LEFT HEART CATH AND CORONARY ANGIOGRAPHY;  Surgeon: Jordan Dorn PARAS, MD;  Location: [facility] INVASIVE CV LAB;  Service: Cardiovascular;  Laterality: N/A;   LEFT HEART CATH AND CORS/GRAFTS ANGIOGRAPHY N/A 11/06/2023   Procedure: LEFT HEART CATH AND CORS/GRAFTS ANGIOGRAPHY;  Surgeon: Swazil[place], Peter M, MD;  Location: [facility]  INVASIVE CV LAB;  Service: Cardiovascular;  Laterality: N/A;   RIGHT HEART CATH N/A 01/21/2017   Procedure: RIGHT HEART CATH;  Surgeon: Jordan Dorn PARAS, MD;  Location: [facility] INVASIVE CV LAB;  Service: Cardiovascular;  Laterality: N/A;   SHOULDER SURGERY     TEE WITHOUT CARDIOVERSION N/A 01/29/2017   Procedure: TRANSESOPHAGEAL ECHOCARDIOGRAM (TEE);  Surgeon: Jordan Ochoa, Maude, MD;  Location: [facility] OR;  Service: Open Heart Surgery;  Laterality: N/A;   TRANSCAROTID ARTERY REVASCULARIZATION  Right 01/08/2024   Procedure: TRANSCAROTID ARTERY REVASCULARIZATION (TCAR);  Surgeon: Jordan Debby SAILOR, MD;  Location: [facility] OR;  Service: Vascular;  Laterality: Right;   Patient Active Problem List   Diagnosis Date Noted   Carotid artery stenosis 01/08/2024   Acute CVA (cerebrovascular accident) (HCC) 12/31/2023   AKI (acute kidney injury) (HCC) 04/24/2022   Hypothyroidism 04/24/2022   Heart murmur 06/12/2018   Low back pain 06/12/2018   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   Chronic kidney disease 12/16/2017   Hyperlipidemia 02/21/2017   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   Chest pain    Bradycardia 01/20/2017   Restless leg syndrome 01/20/2017   Aortic stenosis 01/20/2017   CAD (coronary artery disease), native coronary artery 01/20/2017   Aortic atherosclerosis (HCC) 01/20/2017   Cramp in lower leg associated with rest 03/05/2016   Colon polyps 08/05/2015   Encounter for screening colonoscopy  05/27/2015   Contusion of left knee 09/08/2014   PUD (peptic ulcer disease) 04/22/2012   S/P Nissen fundoplication (without gastrostomy tube) procedure 04/22/2012   Choking 03/12/2012   Dysphagia 03/12/2012   GERD (gastroesophageal reflux disease) 03/12/2012   Hoarseness of voice 03/12/2012   Gross hematuria 11/11/2009   Prostatitis 11/10/2009   Ureteric stone 11/10/2009   Arthropathy 10/07/2009    ONSET DATE: Referred on 01/01/2024   REFERRING DIAG: R29.90 (ICD-10-CM) -  Stroke-like symptoms  THERAPY DIAG:  Cognitive communication deficit  Rationale for Evaluation and Treatment: Rehabilitation  SUBJECTIVE:   SUBJECTIVE STATEMENT: Pt was pleasant and cooperative throughout evaluation.   Pt accompanied by: self  PERTINENT HISTORY: 83 y.o. Hammond with CAD s/p CABG, HTN, HLD, AS s/p AVR, CKD and hypothyroidism who presented with acute left facial droop and weakness.  PAIN:  Are you having pain? No  FALLS: Has patient fallen in last 6 months?  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  DIAGNOSTIC FINDINGS: Per EMR:   MR BRAIN WO CONTRAST IMPRESSION: 1. Acute small focal cortical/subcortical infarct in the right posterior frontal lobe.     Electronically Signed   By: Jordan Hammond Hammond.D.   On: 12/31/2023 16:49  COGNITION: Overall cognitive status: Impaired Areas of impairment:  Attention: Impaired: Alternating, Divided Memory: Impaired: Short term Prospective Auditory Executive function: Impaired: Problem solving, Organization, Planning, Self-correction, and Slow processing Functional deficits: See clinical impression  COGNITIVE COMMUNICATION: Following directions: Follows multi-step commands inconsistently  Auditory comprehension: Impaired: likely 2/2 to attention Verbal expression: Impaired: reports anomia Functional communication: Impaired: see clinical impression  ORAL MOTOR EXAMINATION: Overall status: WFL Comments: NA  STANDARDIZED ASSESSMENTS:   Cognitive Linguistic Quick Test: AGE - 18 - 69   The Cognitive Linguistic Quick Test (CLQT) was administered to assess the relative status of five cognitive  domains: attention, memory, language, executive functioning, and visuospatial skills. Scores from 10 tasks were used to estimate severity ratings (standardized for age groups 18-69 years and 70-89 years) for each domain, a clock drawing task, as well as an overall composite severity rating of cognition.       Task Score Criterion Cut Scores  Personal Facts 8/8 8  Symbol Cancellation 12/12 11  Confrontation Naming -/10 10  Clock Drawing  7/13 12  Story Retelling 5/10 6  Symbol Trails 8/10 9  Generative Naming 6/9 5  Design Memory -/6 5  Mazes  4/8 7  Design Generation 7/13 6      PATIENT REPORTED OUTCOME MEASURES (PROM): To be completed in subsequent sessions.  TREATMENT DATE:     PATIENT EDUCATION: Education details: Slp role in cognitive communication Person educated: Patient Education method: Explanation Education comprehension: verbalized understanding and needs further education   GOALS: Goals reviewed with patient? No  SHORT TERM GOALS: Target date: 03/15/24  Complete CLQT, PROM, and update goals.  Baseline: Goal status: INITIAL  2.   Baseline:  Goal status: INITIAL  3.   Baseline:  Goal status: INITIAL  4.   Baseline:  Goal status: INITIAL  5.   Baseline:  Goal status: INITIAL  6.   Baseline:  Goal status: INITIAL  LONG TERM GOALS: Target date: 04/15/24  Improve score on PROM Baseline:  Goal status: INITIAL  2.   Baseline:  Goal status: INITIAL  3.   Baseline:  Goal status: INITIAL  4.   Baseline:  Goal status: INITIAL  5.   Baseline:  Goal status: INITIAL  6.   Baseline:  Goal status: INITIAL  ASSESSMENT:  CLINICAL IMPRESSION: Pt is a 83 yo male who presents to ST OP for evaluation post CVA. Pt has bilateral hearing aids. Pt reports cognition is his main concern. He endorses re: difficulty with word  retrieval. When prompted, pt reports he was having some trouble with memory, forgetting what he was going to do, remembering details in conversatiosn. Pt was assessed using CLQT - see above subtests. SLP observed difficulty with following directions as well as difficulty identifying errors. Pt was aware it did not look correct, but was unable to complete.  No word finding errors were evident in today's assessment. SLP rec skilled ST services to address cognitive-communication impairment.    OBJECTIVE IMPAIRMENTS: include attention, memory, and executive functioning. These impairments are limiting patient from managing medications, managing appointments, managing finances, household responsibilities, and effectively communicating at home and in community. Factors affecting potential to achieve goals and functional outcome are NA.SABRA Patient will benefit from skilled SLP services to address above impairments and improve overall function.  REHAB POTENTIAL: Good  PLAN:  SLP FREQUENCY: 1x/week  SLP DURATION: 8 weeks  PLANNED INTERVENTIONS: Environmental controls, Cueing hierachy, Cognitive reorganization, Internal/external aids, Functional tasks, SLP instruction and feedback, Compensatory strategies, Patient/family education, and [id number] Treatment of speech (30 or 45 min)     [employer], CCC-SLP 02/14/2024, 9:30 AM

## 2024-02-14 NOTE — Therapy (Signed)
 OUTPATIENT PHYSICAL THERAPY NEURO TREATMENT   Patient Name: Jordan Hammond MRN: [id number] DOB:02-Nov-1940, 83 y.o., male Today's Date: 02/14/2024   PCP: Mercer, NP REFERRING PROVIDER: Jonel, MD  END OF SESSION:  PT End of Session - 02/14/24 0842     Visit Number 2    Date for PT Re-Evaluation 04/23/24    Authorization Type [employer]    PT Start Time [phone]    PT Stop Time 0929    PT Time Calculation (min) 47 min    Activity Tolerance Patient tolerated treatment well    Behavior During Therapy [facility] for tasks assessed/performed          Past Medical History:  Diagnosis Date   Aortic atherosclerosis (HCC) 01/20/2017   Aortic stenosis 01/20/2017   Arthritis    CAD (coronary artery disease), native coronary artery 01/20/2017   GERD (gastroesophageal reflux disease)    Hiatal hernia    History of kidney stones    Hyperlipidemia    Hypothyroidism    Restless leg syndrome    Stroke [facility])    Past Surgical History:  Procedure Laterality Date   AORTIC VALVE REPLACEMENT N/A 01/29/2017   Procedure: AORTIC VALVE REPLACEMENT (AVR);  Surgeon: Fleeta Ochoa, Maude, MD;  Location: [facility] OR;  Service: Open Heart Surgery;  Laterality: N/A;   APPENDECTOMY     BLEPHAROPLASTY Bilateral    bowel obstruction surgery     CATARACT EXTRACTION Bilateral 2018   CHOLECYSTECTOMY     CORONARY ARTERY BYPASS GRAFT N/A 01/29/2017   Procedure: CORONARY ARTERY BYPASS GRAFTING (CABG)  x two, using left internal mammary artery and right leg greater saphenous vein harvested endoscopically;  Surgeon: Fleeta Ochoa Maude, MD;  Location: [facility] OR;  Service: Open Heart Surgery;  Laterality: N/A;   KNEE ARTHROSCOPY     lap nissan     LEFT HEART CATH AND CORONARY ANGIOGRAPHY N/A 01/21/2017   Procedure: LEFT HEART CATH AND CORONARY ANGIOGRAPHY;  Surgeon: Court Dorn PARAS, MD;  Location: [facility] INVASIVE CV LAB;  Service: Cardiovascular;  Laterality: N/A;   LEFT HEART CATH AND CORS/GRAFTS ANGIOGRAPHY N/A 11/06/2023    Procedure: LEFT HEART CATH AND CORS/GRAFTS ANGIOGRAPHY;  Surgeon: Swaziland, Peter M, MD;  Location: [facility] INVASIVE CV LAB;  Service: Cardiovascular;  Laterality: N/A;   RIGHT HEART CATH N/A 01/21/2017   Procedure: RIGHT HEART CATH;  Surgeon: Court Dorn PARAS, MD;  Location: [facility] INVASIVE CV LAB;  Service: Cardiovascular;  Laterality: N/A;   SHOULDER SURGERY     TEE WITHOUT CARDIOVERSION N/A 01/29/2017   Procedure: TRANSESOPHAGEAL ECHOCARDIOGRAM (TEE);  Surgeon: Fleeta Ochoa, Maude, MD;  Location: [facility] OR;  Service: Open Heart Surgery;  Laterality: N/A;   TRANSCAROTID ARTERY REVASCULARIZATION  Right 01/08/2024   Procedure: TRANSCAROTID ARTERY REVASCULARIZATION (TCAR);  Surgeon: Magda Debby SAILOR, MD;  Location: [facility] OR;  Service: Vascular;  Laterality: Right;   Patient Active Problem List   Diagnosis Date Noted   Carotid artery stenosis 01/08/2024   Acute CVA (cerebrovascular accident) (HCC) 12/31/2023   AKI (acute kidney injury) (HCC) 04/24/2022   Hypothyroidism 04/24/2022   Heart murmur 06/12/2018   Low back pain 06/12/2018   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   Chronic kidney disease 12/16/2017   Hyperlipidemia 02/21/2017   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   Chest pain    Bradycardia 01/20/2017   Restless leg syndrome 01/20/2017   Aortic stenosis 01/20/2017   CAD (coronary artery disease), native coronary artery 01/20/2017   Aortic atherosclerosis (HCC) 01/20/2017  Cramp in lower leg associated with rest 03/05/2016   Colon polyps 08/05/2015   Encounter for screening colonoscopy 05/27/2015   Contusion of left knee 09/08/2014   PUD (peptic ulcer disease) 04/22/2012   S/P Nissen fundoplication (without gastrostomy tube) procedure 04/22/2012   Choking 03/12/2012   Dysphagia 03/12/2012   GERD (gastroesophageal reflux disease) 03/12/2012   Hoarseness of voice 03/12/2012   Gross hematuria 11/11/2009   Prostatitis 11/10/2009   Ureteric stone 11/10/2009   Arthropathy  10/07/2009    ONSET DATE: 12/31/23  REFERRING DIAG: CVA  THERAPY DIAG:  Muscle weakness (generalized)  Difficulty in walking, not elsewhere classified  Unsteadiness on feet  Rationale for Evaluation and Treatment: Rehabilitation  SUBJECTIVE:                                                                                                                                                                                             SUBJECTIVE STATEMENT: Doing well not falls  83 yo male [facility] to [facility] on 12/31/23 as a code stroke for L sided weakness and word finding difficulty. 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 REports that he feels that he is very weak.  Would like to walk and be able to go up steps.   Pt accompanied by: self  PERTINENT HISTORY: CABG x 2, HLD, AVR, GERD, CAD, hypothyroidism, anxiety.   PAIN:  Are you having pain? No  PRECAUTIONS: None  RED FLAGS: None   WEIGHT BEARING RESTRICTIONS: No  FALLS: Has patient fallen in last 6 months? Yes. Number of falls 1  LIVING ENVIRONMENT: Lives with: lives with their family Lives in: House/apartment Stairs: No Has following equipment at home: None  PLOF: Independent lives in apartment, grocery shopping, walking 30 minutes 3-4 x/week,   PATIENT GOALS: alk better, feel stronger  OBJECTIVE:  Note: Objective measures were completed at Evaluation unless otherwise noted.  DIAGNOSTIC FINDINGS: see chart  COGNITION: Overall cognitive status: Within functional limits for tasks assessed and reports some memory recall   SENSATION: WFL  MUSCLE LENGTH: Mild tightness in the HS and calf POSTURE: rounded shoulders, forward head, and decreased lumbar lordosis  LOWER EXTREMITY ROM:   WFL's   LOWER EXTREMITY MMT:    MMT Right Eval Left Eval  Hip flexion 4 4-  Hip extension    Hip abduction    Hip adduction    Hip internal rotation    Hip external rotation    Knee  flexion 4+ 4-  Knee extension 4 4-  Ankle dorsiflexion 4 4  Ankle plantarflexion  3+  Ankle inversion  4-  Ankle  eversion  3+  (Blank rows = not tested)  TRANSFERS: Fatigues easily, uses hands at times to push up from chair  GAIT: Reports that he he had some left foot drag the past two weeks but feels it is better, I did not notice it today  FUNCTIONAL TESTS:  5 times sit to stand: 20 s Timed up and go (TUG): 12 s BERG:  48/56                                                                                                                              TREATMENT DATE:  02/14/24 Nustep level 5 x 6 minutes Gait outside to old building 2 flights of stairs Leg press 20# , then 20 # single legs Leg curls 20# 2x 10  Leg extension 5# 2x10 On airex ball toss, eyes closed, head turns and nods Cone toe touches Discussion on HEP, vs walking vs gym activities He wanted to only do walking at home, so we went over safety and how to slowly start and build up  01/22/24 Evaluation    PATIENT EDUCATION: Education details: POC Person educated: Patient Education method: Explanation Education comprehension: verbalized understanding  HOME EXERCISE PROGRAM: TBD  GOALS: Goals reviewed with patient? Yes  SHORT TERM GOALS: Target date: 02/20/24  Independent with initial HEP Baseline: Goal status: met  LONG TERM GOALS: Target date: 04/23/24  Independent with advanced HEP Baseline:  Goal status: met  2.  Increase Berg to 50/56 Baseline:  Goal status: met  3.  Increase left knee strength to 4+ Baseline:  Goal status: met  ASSESSMENT:  CLINICAL IMPRESSION: Patient reports that he feels like he is doing okay, his only fear was the stairs and we did that today and he reported feeling like that was easy [facility]  We discussed a lot of safety and slowly trying to build up   Patient is a 83 y.o. male who was seen today for physical therapy evaluation and  treatment for CVA, some residual left sided weakness, he is improving but has some issues with higher level balance activities.  [facility] and will have to go up steps to get to the track, he reports that he has been afraid to try this   OBJECTIVE IMPAIRMENTS: Abnormal gait, cardiopulmonary status limiting activity, decreased activity tolerance, decreased balance, decreased coordination, decreased endurance, decreased mobility, difficulty walking, decreased ROM, decreased strength, decreased safety awareness, impaired flexibility, improper body mechanics, postural dysfunction, and pain.   REHAB POTENTIAL: Good  CLINICAL DECISION MAKING: Evolving/moderate complexity  EVALUATION COMPLEXITY: Moderate  PLAN:  PT FREQUENCY: 1-2x/week  PT DURATION: 12 weeks  PLANNED INTERVENTIONS: 97164- PT Re-evaluation, 97110-Therapeutic exercises, 97530- Therapeutic activity, [id number]- Neuromuscular re-education, 97535- Self Care, [id number]- Manual therapy, [phone]- Gait training, [id number]- Electrical stimulation (unattended), Patient/Family education, Balance training, Stair training, Vestibular training, Visual/preceptual remediation/compensation, Cryotherapy, and Moist heat  PLAN FOR NEXT  SESSION: D/C goals met patient feels good about his condition   OBADIAH OZELL ORN, PT 02/14/2024, 8:42 AM

## 2024-02-17 NOTE — Progress Notes (Unsigned)
 [facility] [place]  ASSESSMENT / PLAN: 83 y.o. male s/p R TCAR for symptomatic right carotid artery stenosis on 01/08/24.   Recommend:  Abstinence from all tobacco products. Blood glucose control with goal A1c < 7%. Blood pressure control with goal blood pressure < 130/80 mmHg. Lipid reduction therapy with goal LDL-C < 55 mg/dL. Aspirin  81mg  by mouth daily. OK to discontinue Clopidogrel . Atorvastatin  40-80mg  PO QD (or other high intensity statin therapy).  Follow up with me in 1 year with carotid duplex  CHIEF COMPLAINT: Follow-up from surgery  HISTORY OF PRESENT ILLNESS: Jordan Hammond is a 83 y.o. male   Previously: Admitted to hospital for workup of stroke.   Pt states that yesterday morning, he looked in the mirror and noticed the left side of his mouth was drooping.  He states his wife was a Charity fundraiser and felt they should call 911 and he was brought to the hospital.   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   He states that over the past 3 months, he has noticed that when he speaks, he feels like he has a mouth full of food and sometimes not able to speak what he wants to say.  He states that it has gotten worse and probably happens every day.  He states that a few weeks ago, he was having some fatigue and given his cardiac history, he was worked up for that and he states those tests turned out ok.  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    He does have left arm weakness, however, this has been present since 2012 and he does not think it has changed.  He has hx of shoulder issues and weakness due to that and this is unchanged.     He states that he was on Lipitor for 2-3 months after his heart surgery but has not been on that at home.  He has been on daily asa.     He has hx of bowel obstruction in 2006 with laparotomy.  He states he coded  after that surgery.   He has hx of CABG and AVR.   The pt is on a statin for cholesterol management.  The pt is on a daily aspirin .   Other AC:  Plavix  The pt is not on medication for hypertension.   The pt is not on medication for diabetes PTA. Tobacco hx:  never   He denies family hx of AAA but there is family hx of brain aneurysms. His dad had CABG at young age and states he had carotid blockages as well.     02/18/24: Returns to care after right TCAR.  Doing well overall.  Has multiple questions today that I answered to the best my ability.  Overall no concerning features.  No recurrent stroke or TIA.  We reviewed his duplex in detail.  Past Medical History:  Diagnosis Date   Aortic atherosclerosis (HCC) 01/20/2017   Aortic stenosis 01/20/2017   Arthritis    CAD (coronary artery disease), native coronary artery 01/20/2017   GERD (gastroesophageal reflux disease)    Hiatal hernia    History of kidney stones    Hyperlipidemia    Hypothyroidism    Restless leg syndrome    Stroke [facility])     Past Surgical History:  Procedure Laterality Date  AORTIC VALVE REPLACEMENT N/A 01/29/2017   Procedure: AORTIC VALVE REPLACEMENT (AVR);  Surgeon: Fleeta Ochoa, Maude, MD;  Location: [facility] OR;  Service: Open Heart Surgery;  Laterality: N/A;   APPENDECTOMY     BLEPHAROPLASTY Bilateral    bowel obstruction surgery     CATARACT EXTRACTION Bilateral 2018   CHOLECYSTECTOMY     CORONARY ARTERY BYPASS GRAFT N/A 01/29/2017   Procedure: CORONARY ARTERY BYPASS GRAFTING (CABG)  x two, using left internal mammary artery and right leg greater saphenous vein harvested endoscopically;  Surgeon: Fleeta Ochoa Maude, MD;  Location: [facility] OR;  Service: Open Heart Surgery;  Laterality: N/A;   KNEE ARTHROSCOPY     lap nissan     LEFT HEART CATH AND CORONARY ANGIOGRAPHY N/A 01/21/2017   Procedure: LEFT HEART CATH AND CORONARY ANGIOGRAPHY;  Surgeon: Court Dorn PARAS, MD;  Location: [facility] INVASIVE CV LAB;  Service:  Cardiovascular;  Laterality: N/A;   LEFT HEART CATH AND CORS/GRAFTS ANGIOGRAPHY N/A 11/06/2023   Procedure: LEFT HEART CATH AND CORS/GRAFTS ANGIOGRAPHY;  Surgeon: Swaziland, Peter M, MD;  Location: [facility] INVASIVE CV LAB;  Service: Cardiovascular;  Laterality: N/A;   RIGHT HEART CATH N/A 01/21/2017   Procedure: RIGHT HEART CATH;  Surgeon: Court Dorn PARAS, MD;  Location: [facility] INVASIVE CV LAB;  Service: Cardiovascular;  Laterality: N/A;   SHOULDER SURGERY     TEE WITHOUT CARDIOVERSION N/A 01/29/2017   Procedure: TRANSESOPHAGEAL ECHOCARDIOGRAM (TEE);  Surgeon: Fleeta Ochoa, Maude, MD;  Location: [facility] OR;  Service: Open Heart Surgery;  Laterality: N/A;   TRANSCAROTID ARTERY REVASCULARIZATION  Right 01/08/2024   Procedure: TRANSCAROTID ARTERY REVASCULARIZATION (TCAR);  Surgeon: Magda Debby SAILOR, MD;  Location: [facility] OR;  Service: Vascular;  Laterality: Right;    Family History  Problem Relation Age of Onset   CAD Father        s/p triple bypass   Dementia Mother     Social History   Socioeconomic History   Marital status: Married    Spouse name: Not on file   Number of children: Not on file   Years of education: Not on file   Highest education level: Not on file  Occupational History   Not on file  Tobacco Use   Smoking status: Never   Smokeless tobacco: Never  Vaping Use   Vaping status: Never Used  Substance and Sexual Activity   Alcohol use: No   Drug use: No   Sexual activity: Yes  Other Topics Concern   Not on file  Social History Narrative   Divorced, remarried.  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   Social Drivers of Corporate investment banker Strain: Not on [employer] Insecurity: No Food Insecurity (01/08/2024)   Hunger Vital Sign    Worried About Programme researcher, broadcasting/film/video in the Last Year: Never true    Ran Out of Food in the Last Year: Never true  Transportation Needs: No Transportation Needs (01/08/2024)   PRAPARE - Scientist, research (physical sciences) (Medical): No    Lack of Transportation (Non-Medical): No  Physical Activity: Not on file  Stress: No Stress Concern Present (09/28/2021)   Received from [facility] - Occupational Stress Questionnaire    Feeling of Stress : Not at all  Social Connections: Unknown (01/08/2024)   Social Connection and Isolation Panel    Frequency of Communication with Friends and Family: More than three times a  week    Frequency of Social Gatherings with Friends and Family: More than three times a week    Attends Religious Services: More than 4 times per year    Active Member of [employer] or Organizations: Patient declined    Attends Banker Meetings: Patient declined    Marital Status: Married  Catering manager Violence: Not At Risk (01/08/2024)   Humiliation, Afraid, Rape, and Kick questionnaire    Fear of Current or Ex-Partner: No    Emotionally Abused: No    Physically Abused: No    Sexually Abused: No    Allergies  Allergen Reactions   Pravachol [Pravastatin] Nausea And Vomiting   Actiq  [Fentanyl ] Other (See Comments)    Unknown reaction   Ak-Mycin [Erythromycin] Nausea And Vomiting   Bayer Aspirin  [Aspirin ] Nausea And Vomiting    High doses only - OK with low dose QD   Motrin [Ibuprofen] Nausea And Vomiting   Nsaids Nausea And Vomiting   Relafen [Nabumetone] Nausea And Vomiting   Sulfa Antibiotics Other (See Comments)    Unknown reaction   Sumycin [Tetracycline] Nausea And Vomiting   Elemental Sulfur Itching, Swelling and Rash   Sulfamethoxazole Itching, Swelling and Rash    Current Outpatient Medications  Medication Sig Dispense Refill   acetaminophen  (TYLENOL ) 325 MG tablet Take 650 mg by mouth in the morning.     Ascorbic Acid (VITAMIN C PO) Take 1 tablet by mouth daily.     aspirin  EC 81 MG EC tablet Take 1 tablet (81 mg total) by mouth daily.     atorvastatin  (LIPITOR) 20 MG tablet Take 1 tablet (20 mg total) by  mouth daily. (Patient taking differently: Take 20 mg by mouth at bedtime.) 90 tablet 0   benzonatate  (TESSALON ) 100 MG capsule Take 1 capsule (100 mg total) by mouth every 8 (eight) hours. 21 capsule 0   Calcium  Carb-Cholecalciferol (CALCIUM  + VITAMIN D3 PO) Take 1 tablet by mouth daily.     Cholecalciferol (VITAMIN D -3 PO) Take 1 capsule by mouth daily.     clonazePAM  (KLONOPIN ) 1 MG tablet Take 1 mg by mouth at bedtime.     clopidogrel  (PLAVIX ) 75 MG tablet Take 1 tablet (75 mg total) by mouth daily. 30 tablet 0   Coenzyme Q10 (CO Q 10 PO) Take 1 capsule by mouth daily.     Cyanocobalamin  (VITAMIN B-12 PO) Take 1 tablet by mouth daily.     docusate sodium  (COLACE) 100 MG capsule Take 100-200 mg by mouth See admin instructions. Take 2 capsules (200mg ) by mouth every morning and take 1-2 capsules (100-200mg ) at night as needed for constipation.     famotidine  (PEPCID ) 20 MG tablet Take 20 mg by mouth daily as needed for heartburn or indigestion.     Flaxseed, Linseed, (FLAX SEED OIL PO) Take 2 capsules by mouth daily.     Homeopathic Products (LEG CRAMPS) TABS Take 1 tablet by mouth at bedtime. Magnesium      levothyroxine  (SYNTHROID ) 50 MCG tablet Take 50 mcg by mouth daily before breakfast.     nitroGLYCERIN  (NITROSTAT ) 0.4 MG SL tablet Place 1 tablet (0.4 mg total) under the tongue every 5 (five) minutes as needed for chest pain. Do not take nitroglycerine within 48 hours of taking viagra 25 tablet 3   OVER THE COUNTER MEDICATION Place 1-2 sprays into the nose daily as needed (allergies). OTC allergy nasal spray     oxyCODONE  (ROXICODONE ) 5 MG immediate release tablet Take 1 tablet (5 mg  total) by mouth every 6 (six) hours as needed for severe pain (pain score 7-10). 10 tablet 0   Simethicone  (GAS FREE EXTRA STRENGTH PO) Take 1-2 tablets by mouth daily as needed (gas).     traZODone  (DESYREL ) 50 MG tablet Take 50 mg by mouth at bedtime.     amoxicillin  (AMOXIL ) 500 MG capsule Take 2 capsules  (1,000 mg total) by mouth 2 (two) times daily. (Patient not taking: Reported on 02/18/2024) 40 capsule 0   No current facility-administered medications for this visit.    PHYSICAL EXAM Vitals:   02/18/24 0852 02/18/24 0855  BP: 113/65 114/74  Pulse: 72   Temp: 97.6 F (36.4 C)   TempSrc: Temporal   SpO2: 99%   Weight: 190 lb 1.6 oz (86.2 kg)   Height: 6' (1.829 m)    Elderly man in no distress Regular rate and rhythm Unlabored breathing Right neck incision healing nicely  PERTINENT LABORATORY AND RADIOLOGIC DATA  Most recent CBC    Latest Ref Rng & Units 01/12/2024    5:27 PM 01/09/2024    4:12 AM 01/01/2024    3:59 AM  CBC  WBC 4.0 - 10.5 K/uL 7.4  6.3  4.5   Hemoglobin 13.0 - 17.0 g/dL 87.9  88.7  87.0   Hematocrit 39.0 - 52.0 % 35.8  33.2  38.3   Platelets 150 - 400 K/uL 172  149  153      Most recent CMP    Latest Ref Rng & Units 01/12/2024    5:27 PM 01/10/2024    3:46 AM 01/09/2024    4:12 AM  CMP  Glucose 70 - 99 mg/dL 893  888  861   BUN 8 - 23 mg/dL 24  25  20    Creatinine 0.61 - 1.24 mg/dL 8.69  8.87  8.79   Sodium 135 - 145 mmol/L 139  137  137   Potassium 3.5 - 5.1 mmol/L 3.9  3.9  4.2   Chloride 98 - 111 mmol/L 106  107  109   CO2 22 - 32 mmol/L 24  23  21    Calcium  8.9 - 10.3 mg/dL 9.2  8.5  8.5   Total Protein 6.5 - 8.1 g/dL 6.3     Total Bilirubin 0.0 - 1.2 mg/dL 1.0     Alkaline Phos 38 - 126 U/L 53     AST 15 - 41 U/L 24     ALT 0 - 44 U/L 21       Renal function CrCl cannot be calculated (Patient's most recent lab result is older than the maximum 21 days allowed.).  Hgb A1c MFr Bld (%)  Date Value  01/01/2024 5.3    LDL Cholesterol  Date Value Ref Range Status  01/01/2024 51 0 - 99 mg/dL Final    Comment:           Total Cholesterol/HDL:CHD Risk Coronary Heart Disease Risk Table                     Men   Women  1/2 Average Risk   3.4   3.3  Average Risk       5.0   4.4  2 X Average Risk   9.6   7.1  3 X Average Risk  23.4   11.0         Use the calculated Patient Ratio above and the CHD Risk Table to determine the patient's CHD Risk.  ATP III CLASSIFICATION (LDL):  <100     mg/dL   Optimal  899-870  mg/dL   Near or Above                    Optimal  130-159  mg/dL   Borderline  839-810  mg/dL   High  >809     mg/dL   Very High Performed at [facility] Lab, [address], [place], [place] [address]     Carotid Duplex 02/12/24  Right Carotid: Widely patent right internal carotid artery, s/p stent  placement.                The ECA appears >50% stenosed.   Left Carotid: Velocities in the left ICA are consistent with a 1-39%  stenosis.   Vertebrals: Bilateral vertebral arteries demonstrate antegrade flow.  Subclavians: Normal flow hemodynamics were seen in bilateral subclavian               arteries.   Debby SAILOR. Magda, MD [facility] Phone Number: [phone] 02/18/2024 12:27 PM   Total time spent on preparing this encounter including chart review, data review, collecting history, examining the patient, and coordinating care:20 min  Portions of this report may have been transcribed using voice recognition software.  Every effort has been made to ensure accuracy; however, inadvertent computerized transcription errors may still be present.

## 2024-02-18 ENCOUNTER — Ambulatory Visit: Attending: Vascular Surgery | Admitting: Vascular Surgery

## 2024-02-18 ENCOUNTER — Encounter: Payer: Self-pay | Admitting: Vascular Surgery

## 2024-02-18 VITALS — BP 114/74 | HR 72 | Temp 97.6°F | Ht 72.0 in | Wt 190.1 lb

## 2024-02-18 DIAGNOSIS — I639 Cerebral infarction, unspecified: Secondary | ICD-10-CM

## 2024-02-18 DIAGNOSIS — I6523 Occlusion and stenosis of bilateral carotid arteries: Secondary | ICD-10-CM

## 2024-02-19 ENCOUNTER — Ambulatory Visit: Admitting: Speech Pathology

## 2024-02-19 ENCOUNTER — Encounter: Payer: Self-pay | Admitting: Speech Pathology

## 2024-02-19 ENCOUNTER — Ambulatory Visit: Admitting: Physical Therapy

## 2024-02-19 DIAGNOSIS — R262 Difficulty in walking, not elsewhere classified: Secondary | ICD-10-CM | POA: Diagnosis not present

## 2024-02-19 DIAGNOSIS — M6281 Muscle weakness (generalized): Secondary | ICD-10-CM | POA: Diagnosis not present

## 2024-02-19 DIAGNOSIS — R2681 Unsteadiness on feet: Secondary | ICD-10-CM | POA: Diagnosis not present

## 2024-02-19 DIAGNOSIS — R41841 Cognitive communication deficit: Secondary | ICD-10-CM

## 2024-02-19 NOTE — Therapy (Signed)
 OUTPATIENT SPEECH LANGUAGE PATHOLOGY TREATMENT NOTE   Patient Name: Jordan Hammond MRN: [id number] DOB:Aug 29, 1940, 83 y.o., male Today's Date: 02/19/2024  PCP: Mercer Garnette Barter, FNP  REFERRING PROVIDER: Jonel Lonni SHAUNNA, MD  END OF SESSION:  End of Session - 02/19/24 1019     Visit Number 2    SLP Start Time 1015    SLP Stop Time  1055    SLP Time Calculation (min) 40 min    Activity Tolerance Patient tolerated treatment well          Past Medical History:  Diagnosis Date   Aortic atherosclerosis (HCC) 01/20/2017   Aortic stenosis 01/20/2017   Arthritis    CAD (coronary artery disease), native coronary artery 01/20/2017   GERD (gastroesophageal reflux disease)    Hiatal hernia    History of kidney stones    Hyperlipidemia    Hypothyroidism    Restless leg syndrome    Stroke [facility])    Past Surgical History:  Procedure Laterality Date   AORTIC VALVE REPLACEMENT N/A 01/29/2017   Procedure: AORTIC VALVE REPLACEMENT (AVR);  Surgeon: Fleeta Ochoa, Maude, MD;  Location: [facility] OR;  Service: Open Heart Surgery;  Laterality: N/A;   APPENDECTOMY     BLEPHAROPLASTY Bilateral    bowel obstruction surgery     CATARACT EXTRACTION Bilateral 2018   CHOLECYSTECTOMY     CORONARY ARTERY BYPASS GRAFT N/A 01/29/2017   Procedure: CORONARY ARTERY BYPASS GRAFTING (CABG)  x two, using left internal mammary artery and right leg greater saphenous vein harvested endoscopically;  Surgeon: Fleeta Ochoa Maude, MD;  Location: [facility] OR;  Service: Open Heart Surgery;  Laterality: N/A;   KNEE ARTHROSCOPY     lap nissan     LEFT HEART CATH AND CORONARY ANGIOGRAPHY N/A 01/21/2017   Procedure: LEFT HEART CATH AND CORONARY ANGIOGRAPHY;  Surgeon: Court Dorn PARAS, MD;  Location: [facility] INVASIVE CV LAB;  Service: Cardiovascular;  Laterality: N/A;   LEFT HEART CATH AND CORS/GRAFTS ANGIOGRAPHY N/A 11/06/2023   Procedure: LEFT HEART CATH AND CORS/GRAFTS ANGIOGRAPHY;  Surgeon: Swaziland, Peter M, MD;  Location: [facility]  INVASIVE CV LAB;  Service: Cardiovascular;  Laterality: N/A;   RIGHT HEART CATH N/A 01/21/2017   Procedure: RIGHT HEART CATH;  Surgeon: Court Dorn PARAS, MD;  Location: [facility] INVASIVE CV LAB;  Service: Cardiovascular;  Laterality: N/A;   SHOULDER SURGERY     TEE WITHOUT CARDIOVERSION N/A 01/29/2017   Procedure: TRANSESOPHAGEAL ECHOCARDIOGRAM (TEE);  Surgeon: Fleeta Ochoa, Maude, MD;  Location: [facility] OR;  Service: Open Heart Surgery;  Laterality: N/A;   TRANSCAROTID ARTERY REVASCULARIZATION  Right 01/08/2024   Procedure: TRANSCAROTID ARTERY REVASCULARIZATION (TCAR);  Surgeon: Magda Debby SAILOR, MD;  Location: [facility] OR;  Service: Vascular;  Laterality: Right;   Patient Active Problem List   Diagnosis Date Noted   Carotid artery stenosis 01/08/2024   Acute CVA (cerebrovascular accident) (HCC) 12/31/2023   AKI (acute kidney injury) (HCC) 04/24/2022   Hypothyroidism 04/24/2022   Heart murmur 06/12/2018   Low back pain 06/12/2018   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   Chronic kidney disease 12/16/2017   Hyperlipidemia 02/21/2017   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   Chest pain    Bradycardia 01/20/2017   Restless leg syndrome 01/20/2017   Aortic stenosis 01/20/2017   CAD (coronary artery disease), native coronary artery 01/20/2017   Aortic atherosclerosis (HCC) 01/20/2017   Cramp in lower leg associated with rest 03/05/2016   Colon polyps 08/05/2015   Encounter for screening  colonoscopy 05/27/2015   Contusion of left knee 09/08/2014   PUD (peptic ulcer disease) 04/22/2012   S/P Nissen fundoplication (without gastrostomy tube) procedure 04/22/2012   Choking 03/12/2012   Dysphagia 03/12/2012   GERD (gastroesophageal reflux disease) 03/12/2012   Hoarseness of voice 03/12/2012   Gross hematuria 11/11/2009   Prostatitis 11/10/2009   Ureteric stone 11/10/2009   Arthropathy 10/07/2009    ONSET DATE: Referred on 01/01/2024   REFERRING DIAG: R29.90 (ICD-10-CM) -  Stroke-like symptoms  THERAPY DIAG:  Cognitive communication deficit  Rationale for Evaluation and Treatment: Rehabilitation  SUBJECTIVE:   SUBJECTIVE STATEMENT:  Pt reports he was cleared by his cardiothoracic surgeon.   Pt accompanied by: self  PERTINENT HISTORY: 83 y.o. M with CAD s/p CABG, HTN, HLD, AS s/p AVR, CKD and hypothyroidism who presented with acute left facial droop and weakness.  PAIN:  Are you having pain? No  FALLS: Has patient fallen in last 6 months?  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  DIAGNOSTIC FINDINGS: Per EMR:   MR BRAIN WO CONTRAST IMPRESSION: 1. Acute small focal cortical/subcortical infarct in the right posterior frontal lobe.     Electronically Signed   By: Evalene Coho M.D.   On: 12/31/2023 16:49  COGNITION: Overall cognitive status: Impaired Areas of impairment:  Attention: Impaired: Alternating, Divided Memory: Impaired: Short term Prospective Auditory Executive function: Impaired: Problem solving, Organization, Planning, Self-correction, and Slow processing Functional deficits: See clinical impression  COGNITIVE COMMUNICATION: Following directions: Follows multi-step commands inconsistently  Auditory comprehension: Impaired: likely 2/2 to attention Verbal expression: Impaired: reports anomia Functional communication: Impaired: see clinical impression  ORAL MOTOR EXAMINATION: Overall status: WFL Comments: NA  STANDARDIZED ASSESSMENTS:   Cognitive Linguistic Quick Test: AGE - 18 - 69   The Cognitive Linguistic Quick Test (CLQT) was administered to assess the relative status of five  cognitive domains: attention, memory, language, executive functioning, and visuospatial skills. Scores from 10 tasks were used to estimate severity ratings (standardized for age groups 18-69 years and 70-89 years) for each domain, a clock drawing task, as well as an overall composite severity rating of cognition.       Task Score Criterion Cut Scores  Personal Facts 8/8 8  Symbol Cancellation 12/12 11  Confrontation Naming 10/10 10  Clock Drawing  7/13 12  Story Retelling 5/10 6  Symbol Trails 8/10 9  Generative Naming 6/9 5  Design Memory 6/6 5  Mazes  4/8 7  Design Generation 7/13 6    Cognitive Domain Composite Score Severity Rating  Attention 177/215 WNL  Memory 152/185 WNL  Executive Function 25/40 WNL  Language 29/37 WNL  Visuospatial Skills 83/105 WNL  Clock Drawing  7/13 Moderate  Composite Severity Rating  WNL       PATIENT REPORTED OUTCOME MEASURES (PROM): Neuro QOL - Cognition: 99/140  TREATMENT DATE:    02/19/24: Pt was seen for skilled ST services targeting continued assessment, PROM completion, and education. SLP reviewed scores on CLQT - see above - and utilized PROM to assist with goal setting. 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 Based off scores and discussions, we have decided to target word retrieval and memory strategies to support cognitive-communication. Pt verbalized understanding. To initiate strategy education next session.     PATIENT EDUCATION: Education details: Slp role in cognitive communication Person educated: Patient Education method: Explanation Education comprehension: verbalized understanding and needs further education   GOALS: Goals reviewed with patient? No  SHORT TERM  GOALS: Target date: 03/15/24  Complete CLQT, PROM, and update goals.  Baseline: Goal status: MET  2.  Pt will verbalize two strategies to support recall of important information in conversations/home/community. Baseline:  Goal status: INITIAL  3.  Pt will verbalize 2 strategies to encourage word retrieval in conversations.  Baseline:  Goal status: INITIAL    LONG TERM GOALS: Target date: 04/15/24  Improve score on PROM Baseline:  Goal status: INITIAL  2.  Pt will provide examples of strategy implementation in supporting recall of important information in conversations/home/community. Baseline:  Goal status: INITIAL  3.  Pt will report and/or demonstrate use of anomia compensations in unstructured conversation. Baseline:  Goal status: INITIAL    ASSESSMENT:  CLINICAL IMPRESSION: Pt is a 83 yo male who presented to ST OP for evaluation post CVA.  SEE TX NOTE. SLP rec skilled ST services to address cognitive-communication impairment.    OBJECTIVE IMPAIRMENTS: include attention, memory, and executive functioning. These impairments are limiting patient from managing medications, managing appointments, managing finances, household responsibilities, and effectively communicating at home and in community. Factors affecting potential to achieve goals and functional outcome are NA.SABRA Patient will benefit from skilled SLP services to address above impairments and improve overall function.  REHAB POTENTIAL: Good  PLAN:  SLP FREQUENCY: 1x/week  SLP DURATION: 8 weeks  PLANNED INTERVENTIONS: Environmental controls, Cueing hierachy, Cognitive reorganization, Internal/external aids, Functional tasks, SLP instruction and feedback, Compensatory strategies, Patient/family education, and [id number] Treatment of speech (30 or 45 min)     [employer], CCC-SLP 02/19/2024, 10:22 AM

## 2024-02-20 ENCOUNTER — Inpatient Hospital Stay: Admitting: Adult Health

## 2024-02-20 NOTE — Progress Notes (Deleted)
 [facility] [address] [place]. [place] [address] [phone]       HOSPITAL FOLLOW UP NOTE  [name] Date of Birth:  Jul 01, 1940 Medical Record Number:  [id number]   Reason for Referral:  hospital stroke follow up    SUBJECTIVE:   CHIEF COMPLAINT:  No chief complaint on file.   HPI:   [name] is a 83 y.o. male with history of CAD status post CABG, aortic stenosis post bioprosthetic aortic valve replacement and hyperlipidemia who presented to [facility] ED on 12/31/2023 with sudden onset left facial droop, dysarthria and left-sided weakness.  Stroke workup revealed right posterior frontal lobe to small infarcts possibly atheroembolic from high risk right ICA plaque but unable to completely rule out A-fib.  CTA head/neck showed moderate calcific plaque within bilateral carotid bulbs with 50% stenosis on the right and carotid Doppler noting right ICA 40 to 59% stenosis. VVS consult ***.  Recommended DAPT with duration determined by VVS.  Recommended 30-day cardiac event monitor to rule out A-fib.  LDL 51.  A1c 5.3.  Recommend continuation of atorvastatin  20 mg daily.  Therapies recommended outpatient therapy.     VVS d/c'd plavix , remains on aspirin  alone as well as atorvastatin .  Plans on f/u with VVS in 1 year     PERTINENT IMAGING  Code Stroke CT head No acute abnormality. Small vessel disease. ASPECTS 10.    CTA head & neck no LVO, moderate calcific plaque within bilateral carotid bulbs with 50% stenosis on the right, moderate calcific atheromatous disease within cavernous and supraclinoid segments of internal carotid arteries with about 50% bilateral stenosis CT perfusion no core or penumbra infarct seen MRI acute small focal cortical and subcortical infarct in right posterior frontal lobe Carotid Doppler R 40-59% stenosis 2D Echo EF 60 to 65%, no PFO LDL 51 HgbA1c 5.3    ROS:   14 system review of systems performed and negative  with exception of ***  PMH:  Past Medical History:  Diagnosis Date   Aortic atherosclerosis (HCC) 01/20/2017   Aortic stenosis 01/20/2017   Arthritis    CAD (coronary artery disease), native coronary artery 01/20/2017   GERD (gastroesophageal reflux disease)    Hiatal hernia    History of kidney stones    Hyperlipidemia    Hypothyroidism    Restless leg syndrome    Stroke [facility])     PSH:  Past Surgical History:  Procedure Laterality Date   AORTIC VALVE REPLACEMENT N/A 01/29/2017   Procedure: AORTIC VALVE REPLACEMENT (AVR);  Surgeon: Fleeta Ochoa, Maude, MD;  Location: [facility] OR;  Service: Open Heart Surgery;  Laterality: N/A;   APPENDECTOMY     BLEPHAROPLASTY Bilateral    bowel obstruction surgery     CATARACT EXTRACTION Bilateral 2018   CHOLECYSTECTOMY     CORONARY ARTERY BYPASS GRAFT N/A 01/29/2017   Procedure: CORONARY ARTERY BYPASS GRAFTING (CABG)  x two, using left internal mammary artery and right leg greater saphenous vein harvested endoscopically;  Surgeon: Fleeta Ochoa Maude, MD;  Location: [facility] OR;  Service: Open Heart Surgery;  Laterality: N/A;   KNEE ARTHROSCOPY     lap nissan     LEFT HEART CATH AND CORONARY ANGIOGRAPHY N/A 01/21/2017   Procedure: LEFT HEART CATH AND CORONARY ANGIOGRAPHY;  Surgeon: Court Dorn PARAS, MD;  Location: [facility] INVASIVE CV LAB;  Service: Cardiovascular;  Laterality: N/A;   LEFT HEART CATH AND CORS/GRAFTS ANGIOGRAPHY N/A 11/06/2023   Procedure: LEFT HEART CATH AND  CORS/GRAFTS ANGIOGRAPHY;  Surgeon: Swaziland, Peter M, MD;  Location: [facility] INVASIVE CV LAB;  Service: Cardiovascular;  Laterality: N/A;   RIGHT HEART CATH N/A 01/21/2017   Procedure: RIGHT HEART CATH;  Surgeon: Court Dorn PARAS, MD;  Location: [facility] INVASIVE CV LAB;  Service: Cardiovascular;  Laterality: N/A;   SHOULDER SURGERY     TEE WITHOUT CARDIOVERSION N/A 01/29/2017   Procedure: TRANSESOPHAGEAL ECHOCARDIOGRAM (TEE);  Surgeon: Fleeta Ochoa, Maude, MD;  Location: [facility] OR;  Service: Open Heart Surgery;   Laterality: N/A;   TRANSCAROTID ARTERY REVASCULARIZATION  Right 01/08/2024   Procedure: TRANSCAROTID ARTERY REVASCULARIZATION (TCAR);  Surgeon: Magda Debby SAILOR, MD;  Location: [facility] OR;  Service: Vascular;  Laterality: Right;    Social History:  Social History   Socioeconomic History   Marital status: Married    Spouse name: Not on file   Number of children: Not on file   Years of education: Not on file   Highest education level: Not on file  Occupational History   Not on file  Tobacco Use   Smoking status: Never   Smokeless tobacco: Never  Vaping Use   Vaping status: Never Used  Substance and Sexual Activity   Alcohol use: No   Drug use: No   Sexual activity: Yes  Other Topics Concern   Not on file  Social History Narrative   Divorced, remarried.  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   Social Drivers of Corporate investment banker Strain: Not on [employer] Insecurity: No Food Insecurity (01/08/2024)   Hunger Vital Sign    Worried About Programme researcher, broadcasting/film/video in the Last Year: Never true    Ran Out of Food in the Last Year: Never true  Transportation Needs: No Transportation Needs (01/08/2024)   PRAPARE - Administrator, Civil Service (Medical): No    Lack of Transportation (Non-Medical): No  Physical Activity: Not on file  Stress: No Stress Concern Present (09/28/2021)   Received from [facility] - Occupational Stress Questionnaire    Feeling of Stress : Not at all  Social Connections: Unknown (01/08/2024)   Social Connection and Isolation Panel    Frequency of Communication with Friends and Family: More than three times a week    Frequency of Social Gatherings with Friends and Family: More than three times a week    Attends Religious Services: More than 4 times per year    Active Member of [employer] or Organizations: Patient declined    Attends Banker Meetings: Patient  declined    Marital Status: Married  Catering manager Violence: Not At Risk (01/08/2024)   Humiliation, Afraid, Rape, and Kick questionnaire    Fear of Current or Ex-Partner: No    Emotionally Abused: No    Physically Abused: No    Sexually Abused: No    Family History:  Family History  Problem Relation Age of Onset   CAD Father        s/p triple bypass   Dementia Mother     Medications:   Current Outpatient Medications on File Prior to Visit  Medication Sig Dispense Refill   acetaminophen  (TYLENOL ) 325 MG tablet Take 650 mg by mouth in the morning.     amoxicillin  (AMOXIL ) 500 MG capsule Take 2 capsules (1,000 mg total) by mouth 2 (two) times daily. (Patient not taking: Reported on 02/18/2024) 40 capsule 0   Ascorbic Acid (  VITAMIN C PO) Take 1 tablet by mouth daily.     aspirin  EC 81 MG EC tablet Take 1 tablet (81 mg total) by mouth daily.     atorvastatin  (LIPITOR) 20 MG tablet Take 1 tablet (20 mg total) by mouth daily. (Patient taking differently: Take 20 mg by mouth at bedtime.) 90 tablet 0   benzonatate  (TESSALON ) 100 MG capsule Take 1 capsule (100 mg total) by mouth every 8 (eight) hours. 21 capsule 0   Calcium  Carb-Cholecalciferol (CALCIUM  + VITAMIN D3 PO) Take 1 tablet by mouth daily.     Cholecalciferol (VITAMIN D -3 PO) Take 1 capsule by mouth daily.     clonazePAM  (KLONOPIN ) 1 MG tablet Take 1 mg by mouth at bedtime.     clopidogrel  (PLAVIX ) 75 MG tablet Take 1 tablet (75 mg total) by mouth daily. 30 tablet 0   Coenzyme Q10 (CO Q 10 PO) Take 1 capsule by mouth daily.     Cyanocobalamin  (VITAMIN B-12 PO) Take 1 tablet by mouth daily.     docusate sodium  (COLACE) 100 MG capsule Take 100-200 mg by mouth See admin instructions. Take 2 capsules (200mg ) by mouth every morning and take 1-2 capsules (100-200mg ) at night as needed for constipation.     famotidine  (PEPCID ) 20 MG tablet Take 20 mg by mouth daily as needed for heartburn or indigestion.     Flaxseed, Linseed, (FLAX SEED  OIL PO) Take 2 capsules by mouth daily.     Homeopathic Products (LEG CRAMPS) TABS Take 1 tablet by mouth at bedtime. Magnesium      levothyroxine  (SYNTHROID ) 50 MCG tablet Take 50 mcg by mouth daily before breakfast.     nitroGLYCERIN  (NITROSTAT ) 0.4 MG SL tablet Place 1 tablet (0.4 mg total) under the tongue every 5 (five) minutes as needed for chest pain. Do not take nitroglycerine within 48 hours of taking viagra 25 tablet 3   OVER THE COUNTER MEDICATION Place 1-2 sprays into the nose daily as needed (allergies). OTC allergy nasal spray     oxyCODONE  (ROXICODONE ) 5 MG immediate release tablet Take 1 tablet (5 mg total) by mouth every 6 (six) hours as needed for severe pain (pain score 7-10). 10 tablet 0   Simethicone  (GAS FREE EXTRA STRENGTH PO) Take 1-2 tablets by mouth daily as needed (gas).     traZODone  (DESYREL ) 50 MG tablet Take 50 mg by mouth at bedtime.     No current facility-administered medications on file prior to visit.    Allergies:   Allergies  Allergen Reactions   Pravachol [Pravastatin] Nausea And Vomiting   Actiq  [Fentanyl ] Other (See Comments)    Unknown reaction   Ak-Mycin [Erythromycin] Nausea And Vomiting   Bayer Aspirin  [Aspirin ] Nausea And Vomiting    High doses only - OK with low dose QD   Motrin [Ibuprofen] Nausea And Vomiting   Nsaids Nausea And Vomiting   Relafen [Nabumetone] Nausea And Vomiting   Sulfa Antibiotics Other (See Comments)    Unknown reaction   Sumycin [Tetracycline] Nausea And Vomiting   Elemental Sulfur Itching, Swelling and Rash   Sulfamethoxazole Itching, Swelling and Rash      OBJECTIVE:  Physical Exam  There were no vitals filed for this visit. There is no height or weight on file to calculate BMI. No results found.   General: well developed, well nourished, seated, in no evident distress Head: head normocephalic and atraumatic.   Neck: supple with no carotid or supraclavicular bruits Cardiovascular: regular rate and  rhythm,  no murmurs Musculoskeletal: no deformity Skin:  no rash/petichiae Vascular:  Normal pulses all extremities   Neurologic Exam Mental Status: Awake and fully alert. Oriented to place and time. Recent and remote memory intact. Attention span, concentration and fund of knowledge appropriate. Mood and affect appropriate.  Cranial Nerves: Fundoscopic exam reveals sharp disc margins. Pupils equal, briskly reactive to light. Extraocular movements full without nystagmus. Visual fields full to confrontation. Hearing intact. Facial sensation intact. Face, tongue, palate moves normally and symmetrically.  Motor: Normal bulk and tone. Normal strength in all tested extremity muscles Sensory.: intact to touch , pinprick , position and vibratory sensation.  Coordination: Rapid alternating movements normal in all extremities. Finger-to-nose and heel-to-shin performed accurately bilaterally. Gait and Station: Arises from chair without difficulty. Stance is normal. Gait demonstrates normal stride length and balance with ***. Tandem walk and heel toe ***.  Reflexes: 1+ and symmetric. Toes downgoing.     NIHSS  *** Modified Rankin  ***      ASSESSMENT: Jordan Hammond is a 83 y.o. year old male with right posterior frontal lobe 2 small infarcts on 12/31/2023 etiology possibly arthroembolic from high risk right ICA plaque but unable to completely rule out A-fib. Vascular risk factors include hx of CAD s/p CABG, AS s/p AVR, HTN, and advanced age.      PLAN:  Frontal lobe stroke:  Residual deficit: ***.  Cardiac monitor *** Continue {anticoagluation:28091} and atorvastatin  for secondary stroke prevention managed/prescribed by PCP.   Discussed secondary stroke prevention measures and importance of close PCP follow up for aggressive stroke risk factor management including BP goal<130/90, HLD with LDL goal<70 and DM with A1c.<7 .  Stroke labs 12/2023: LDL 51, A1c 5.3 I have gone over the  pathophysiology of stroke, warning signs and symptoms, risk factors and their management in some detail with instructions to go to the closest emergency room for symptoms of concern. Right carotid stenosis S/p TCAR 01/08/2024    Follow up in *** or call earlier if needed   CC:  [facility] provider: Dr. Rosemarie PCP: Mercer Garnette Barter, FNP    I personally spent a total of *** minutes in the care of the patient today including {Time Based Coding:[id number]}.    Harlene Bogaert, AGNP-BC  [facility] [address] [place], [place] [address]  Phone [phone] Fax [phone] Note: This document was prepared with digital dictation and possible smart phrase technology. Any transcriptional errors that result from this process are unintentional.

## 2024-02-24 ENCOUNTER — Ambulatory Visit: Attending: Cardiology

## 2024-02-24 ENCOUNTER — Ambulatory Visit: Payer: Self-pay | Admitting: Cardiology

## 2024-02-24 DIAGNOSIS — I639 Cerebral infarction, unspecified: Secondary | ICD-10-CM

## 2024-02-26 ENCOUNTER — Ambulatory Visit: Admitting: Speech Pathology

## 2024-02-26 ENCOUNTER — Ambulatory Visit: Admitting: Physical Therapy

## 2024-02-26 ENCOUNTER — Encounter: Payer: Self-pay | Admitting: Speech Pathology

## 2024-02-26 DIAGNOSIS — R41841 Cognitive communication deficit: Secondary | ICD-10-CM

## 2024-02-26 DIAGNOSIS — R2681 Unsteadiness on feet: Secondary | ICD-10-CM | POA: Diagnosis not present

## 2024-02-26 DIAGNOSIS — R262 Difficulty in walking, not elsewhere classified: Secondary | ICD-10-CM | POA: Diagnosis not present

## 2024-02-26 DIAGNOSIS — M6281 Muscle weakness (generalized): Secondary | ICD-10-CM | POA: Diagnosis not present

## 2024-02-26 NOTE — Therapy (Signed)
 OUTPATIENT SPEECH LANGUAGE PATHOLOGY TREATMENT NOTE   Patient Name: Jordan Hammond MRN: [id number] DOB:[birth year], 83 y.o., male Today's Date: 02/26/2024  PCP: Mercer Garnette Barter, FNP  REFERRING PROVIDER: Jonel Lonni SHAUNNA, MD  END OF SESSION:  End of Session - 02/26/24 1021     Visit Number 3    SLP Start Time 1015    SLP Stop Time  1055    SLP Time Calculation (min) 40 min    Activity Tolerance Patient tolerated treatment well          Past Medical History:  Diagnosis Date   Aortic atherosclerosis (HCC) 01/20/2017   Aortic stenosis 01/20/2017   Arthritis    CAD (coronary artery disease), native coronary artery 01/20/2017   GERD (gastroesophageal reflux disease)    Hiatal hernia    History of kidney stones    Hyperlipidemia    Hypothyroidism    Restless leg syndrome    Stroke [facility])    Past Surgical History:  Procedure Laterality Date   AORTIC VALVE REPLACEMENT N/A 01/29/2017   Procedure: AORTIC VALVE REPLACEMENT (AVR);  Surgeon: Fleeta Ochoa, Maude, MD;  Location: [facility] OR;  Service: Open Heart Surgery;  Laterality: N/A;   APPENDECTOMY     BLEPHAROPLASTY Bilateral    bowel obstruction surgery     CATARACT EXTRACTION Bilateral 2018   CHOLECYSTECTOMY     CORONARY ARTERY BYPASS GRAFT N/A 01/29/2017   Procedure: CORONARY ARTERY BYPASS GRAFTING (CABG)  x two, using left internal mammary artery and right leg greater saphenous vein harvested endoscopically;  Surgeon: Fleeta Ochoa Maude, MD;  Location: [facility] OR;  Service: Open Heart Surgery;  Laterality: N/A;   KNEE ARTHROSCOPY     lap nissan     LEFT HEART CATH AND CORONARY ANGIOGRAPHY N/A 01/21/2017   Procedure: LEFT HEART CATH AND CORONARY ANGIOGRAPHY;  Surgeon: Court Dorn PARAS, MD;  Location: [facility] INVASIVE CV LAB;  Service: Cardiovascular;  Laterality: N/A;   LEFT HEART CATH AND CORS/GRAFTS ANGIOGRAPHY N/A 11/06/2023   Procedure: LEFT HEART CATH AND CORS/GRAFTS ANGIOGRAPHY;  Surgeon: Swaziland, Peter M, MD;  Location: [facility]  INVASIVE CV LAB;  Service: Cardiovascular;  Laterality: N/A;   RIGHT HEART CATH N/A 01/21/2017   Procedure: RIGHT HEART CATH;  Surgeon: Court Dorn PARAS, MD;  Location: [facility] INVASIVE CV LAB;  Service: Cardiovascular;  Laterality: N/A;   SHOULDER SURGERY     TEE WITHOUT CARDIOVERSION N/A 01/29/2017   Procedure: TRANSESOPHAGEAL ECHOCARDIOGRAM (TEE);  Surgeon: Fleeta Ochoa, Maude, MD;  Location: [facility] OR;  Service: Open Heart Surgery;  Laterality: N/A;   TRANSCAROTID ARTERY REVASCULARIZATION  Right 01/08/2024   Procedure: TRANSCAROTID ARTERY REVASCULARIZATION (TCAR);  Surgeon: Magda Debby SAILOR, MD;  Location: [facility] OR;  Service: Vascular;  Laterality: Right;   Patient Active Problem List   Diagnosis Date Noted   Carotid artery stenosis 01/08/2024   Acute CVA (cerebrovascular accident) (HCC) 12/31/2023   AKI (acute kidney injury) (HCC) 04/24/2022   Hypothyroidism 04/24/2022   Heart murmur 06/12/2018   Low back pain 06/12/2018   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   Chronic kidney disease 12/16/2017   Hyperlipidemia 02/21/2017   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   Chest pain    Bradycardia 01/20/2017   Restless leg syndrome 01/20/2017   Aortic stenosis 01/20/2017   CAD (coronary artery disease), native coronary artery 01/20/2017   Aortic atherosclerosis (HCC) 01/20/2017   Cramp in lower leg associated with rest 03/05/2016   Colon polyps 08/05/2015   Encounter for screening  colonoscopy 05/27/2015   Contusion of left knee 09/08/2014   PUD (peptic ulcer disease) 04/22/2012   S/P Nissen fundoplication (without gastrostomy tube) procedure 04/22/2012   Choking 03/12/2012   Dysphagia 03/12/2012   GERD (gastroesophageal reflux disease) 03/12/2012   Hoarseness of voice 03/12/2012   Gross hematuria 11/11/2009   Prostatitis 11/10/2009   Ureteric stone 11/10/2009   Arthropathy 10/07/2009    ONSET DATE: Referred on 01/01/2024   REFERRING DIAG: R29.90 (ICD-10-CM) -  Stroke-like symptoms  THERAPY DIAG:  Cognitive communication deficit  Rationale for Evaluation and Treatment: Rehabilitation  SUBJECTIVE:   SUBJECTIVE STATEMENT:  Pt does not have any new information.   Pt accompanied by: self  PERTINENT HISTORY: 83 y.o. M with CAD s/p CABG, HTN, HLD, AS s/p AVR, CKD and hypothyroidism who presented with acute left facial droop and weakness.  PAIN:  Are you having pain? No  FALLS: Has patient fallen in last 6 months?  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  DIAGNOSTIC FINDINGS: Per EMR:   MR BRAIN WO CONTRAST IMPRESSION: 1. Acute small focal cortical/subcortical infarct in the right posterior frontal lobe.     Electronically Signed   By: Evalene Coho M.D.   On: 12/31/2023 16:49  COGNITION: Overall cognitive status: Impaired Areas of impairment:  Attention: Impaired: Alternating, Divided Memory: Impaired: Short term Prospective Auditory Executive function: Impaired: Problem solving, Organization, Planning, Self-correction, and Slow processing Functional deficits: See clinical impression  COGNITIVE COMMUNICATION: Following directions: Follows multi-step commands inconsistently  Auditory comprehension: Impaired: likely 2/2 to attention Verbal expression: Impaired: reports anomia Functional communication: Impaired: see clinical impression  ORAL MOTOR EXAMINATION: Overall status: WFL Comments: NA  STANDARDIZED ASSESSMENTS:   Cognitive Linguistic Quick Test: AGE - 18 - 69   The Cognitive Linguistic Quick Test (CLQT) was administered to assess the relative status of five cognitive domains:  attention, memory, language, executive functioning, and visuospatial skills. Scores from 10 tasks were used to estimate severity ratings (standardized for age groups 18-69 years and 70-89 years) for each domain, a clock drawing task, as well as an overall composite severity rating of cognition.       Task Score Criterion Cut Scores  Personal Facts 8/8 8  Symbol Cancellation 12/12 11  Confrontation Naming 10/10 10  Clock Drawing  7/13 12  Story Retelling 5/10 6  Symbol Trails 8/10 9  Generative Naming 6/9 5  Design Memory 6/6 5  Mazes  4/8 7  Design Generation 7/13 6    Cognitive Domain Composite Score Severity Rating  Attention 177/215 WNL  Memory 152/185 WNL  Executive Function 25/40 WNL  Language 29/37 WNL  Visuospatial Skills 83/105 WNL  Clock Drawing  7/13 Moderate  Composite Severity Rating  WNL       PATIENT REPORTED OUTCOME MEASURES (PROM): Neuro QOL - Cognition: 99/140  TREATMENT DATE:   02/26/24: Pt was seen for skilled ST services targeting cognitive-communication. Pt reports he gets off topic and goes on longer and forgets where he started.SLP gleaned he was describing difficulties with topic maintenance and thought organization - SLP encouraged scripting before phone calls to assist with organization. Briefly education on eliminating distractions during conversations. To provide additional thought organization strategies. SLP initiated education on word retrieval strategies re: delay (pause with purpose) and describe (circumlocution). To cont with word finding strategies next session. Pt to practice delay and scripting for HEP.    02/19/24: Pt was seen for skilled ST services targeting continued assessment, PROM completion, and education. SLP reviewed scores on CLQT - see above - and utilized PROM to assist with goal setting. 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 Based off scores and discussions, we have decided to target word retrieval and memory strategies to support cognitive-communication. Pt verbalized understanding. To initiate strategy education next session.     PATIENT EDUCATION: Education details: Slp role in cognitive communication Person educated: Patient Education method: Explanation Education comprehension: verbalized understanding and needs further education   GOALS: Goals reviewed with patient? No  SHORT TERM GOALS: Target date: 03/15/24  Complete CLQT, PROM, and update goals.  Baseline: Goal status: MET  2.  Pt will verbalize two strategies to support recall of important information in conversations/home/community. Baseline:  Goal status: INITIAL  3.  Pt will verbalize 2 strategies to encourage word retrieval in conversations.  Baseline:  Goal status: INITIAL    LONG TERM GOALS: Target date: 04/15/24  Improve score on PROM Baseline:  Goal status: INITIAL  2.  Pt will provide examples of strategy implementation in supporting recall of important information in conversations/home/community. Baseline:  Goal status: INITIAL  3.  Pt will report and/or demonstrate use of anomia compensations in unstructured conversation. Baseline:  Goal status: INITIAL    ASSESSMENT:  CLINICAL IMPRESSION: Pt is a 83 yo male who presented to ST OP for evaluation post CVA.  SEE TX NOTE. SLP rec skilled ST services to address cognitive-communication impairment.    OBJECTIVE IMPAIRMENTS: include attention, memory, and executive functioning. These impairments are limiting patient from managing medications, managing appointments, managing finances, household responsibilities, and effectively communicating at home and in community. Factors affecting potential to achieve goals  and functional outcome are NA.SABRA Patient will benefit from skilled SLP services to address above impairments and improve overall function.  REHAB POTENTIAL: Good  PLAN:  SLP FREQUENCY: 1x/week  SLP DURATION: 8 weeks  PLANNED INTERVENTIONS: Environmental controls, Cueing hierachy, Cognitive reorganization, Internal/external aids, Functional tasks, SLP instruction and feedback, Compensatory strategies, Patient/family education, and [id number] Treatment of speech (30 or 45 min)     [employer], CCC-SLP 02/26/2024, 10:22 AM

## 2024-03-03 ENCOUNTER — Inpatient Hospital Stay: Admitting: Adult Health

## 2024-03-04 ENCOUNTER — Telehealth: Payer: Self-pay

## 2024-03-04 NOTE — Telephone Encounter (Signed)
   Pre-operative Risk Assessment    Patient Name: Jordan Hammond  DOB: 11-Jul-1940 MRN: [id number]   Date of last office visit: 10/31/23 REDELL SHALLOW, MD Date of next office visit: 03/24/24 REDELL SHALLOW, MD   Request for Surgical Clearance    Procedure:  BILATERAL UPPER EYELID BLEPHAROPTOSIS REPAIR CC BILATERAL UPPER EYELID BLEPHAROPLASTY  Date of Surgery:  Clearance 04/13/24                                Surgeon:  DR OSBORNE PONDER Surgeon's Group or Practice Name:  [facility] Phone number:  [phone] Fax number:  [phone]   Type of Clearance Requested:   - Medical  - Pharmacy:  Hold Aspirin  and Clopidogrel  (Plavix )     Type of Anesthesia:  MAC   Additional requests/questions:    Signed, Lucie DELENA Ku   03/04/2024, 3:31 PM

## 2024-03-04 NOTE — Telephone Encounter (Signed)
   Name: Jordan Hammond  DOB: 03-28-1941  MRN: [id number]  Primary Cardiologist: Redell Shallow, MD   Preoperative team, please contact this patient and set up a phone call appointment for further preoperative risk assessment. Please obtain consent and complete medication review. Thank you for your help.  I confirm that guidance regarding antiplatelet and oral anticoagulation therapy has been completed and, if necessary, noted below.  Per office protocol, if patient is without any new symptoms or concerns at the time of their virtual visit, he may hold ASA for 7 days and Plavix  for 5 days prior to procedure. Please resume Aspirin  and Plavix  as soon as possible postprocedure, at the discretion of the surgeon.    I also confirmed the patient resides in the state of [place] . As per [facility] Medical Board telemedicine laws, the patient must reside in the state in which the provider is licensed.   Lamarr Satterfield, NP 03/04/2024, 3:43 PM [facility]

## 2024-03-04 NOTE — Telephone Encounter (Addendum)
 Pt has appt with Dr. Pietro 03/24/24 for sob, dizziness. Procedure with Dr. Zaldivar not until 04/13/24. Will update the preop APP to confirm agree pt to be seen in the office with the appt already scheduled.   I d/w the preop APP Lum Louis, NP who states the pt needs to be seen in office as opposed to tele appt. Pt to keep the appt with Dr. Pietro.   I will update all the parties involved.

## 2024-03-05 ENCOUNTER — Ambulatory Visit: Admitting: Speech Pathology

## 2024-03-05 ENCOUNTER — Encounter: Payer: Self-pay | Admitting: Speech Pathology

## 2024-03-05 DIAGNOSIS — R262 Difficulty in walking, not elsewhere classified: Secondary | ICD-10-CM | POA: Diagnosis not present

## 2024-03-05 DIAGNOSIS — M6281 Muscle weakness (generalized): Secondary | ICD-10-CM | POA: Diagnosis not present

## 2024-03-05 DIAGNOSIS — R41841 Cognitive communication deficit: Secondary | ICD-10-CM

## 2024-03-05 DIAGNOSIS — R2681 Unsteadiness on feet: Secondary | ICD-10-CM | POA: Diagnosis not present

## 2024-03-05 NOTE — Therapy (Signed)
 OUTPATIENT SPEECH LANGUAGE PATHOLOGY TREATMENT NOTE   Patient Name: Jordan Hammond MRN: [id number] DOB:13-Jul-1940, 83 y.o., male Today's Date: 03/05/2024  PCP: Jordan Garnette Barter, FNP  REFERRING PROVIDER: Jonel Lonni SHAUNNA, MD  END OF SESSION:  End of Session - 03/05/24 1102     Visit Number 4    SLP Start Time 1100    SLP Stop Time  1140    SLP Time Calculation (min) 40 min    Activity Tolerance Patient tolerated treatment well          Past Medical History:  Diagnosis Date   Aortic atherosclerosis 01/20/2017   Aortic stenosis 01/20/2017   Arthritis    CAD (coronary artery disease), native coronary artery 01/20/2017   GERD (gastroesophageal reflux disease)    Hiatal hernia    History of kidney stones    Hyperlipidemia    Hypothyroidism    Restless leg syndrome    Stroke [facility])    Past Surgical History:  Procedure Laterality Date   AORTIC VALVE REPLACEMENT N/A 01/29/2017   Procedure: AORTIC VALVE REPLACEMENT (AVR);  Surgeon: Jordan Hammond, Maude, MD;  Location: [facility] OR;  Service: Open Heart Surgery;  Laterality: N/A;   APPENDECTOMY     BLEPHAROPLASTY Bilateral    bowel obstruction surgery     CATARACT EXTRACTION Bilateral 2018   CHOLECYSTECTOMY     CORONARY ARTERY BYPASS GRAFT N/A 01/29/2017   Procedure: CORONARY ARTERY BYPASS GRAFTING (CABG)  x two, using left internal mammary artery and right leg greater saphenous vein harvested endoscopically;  Surgeon: Jordan Hammond Maude, MD;  Location: [facility] OR;  Service: Open Heart Surgery;  Laterality: N/A;   KNEE ARTHROSCOPY     lap nissan     LEFT HEART CATH AND CORONARY ANGIOGRAPHY N/A 01/21/2017   Procedure: LEFT HEART CATH AND CORONARY ANGIOGRAPHY;  Surgeon: Jordan Dorn PARAS, MD;  Location: [facility] INVASIVE CV LAB;  Service: Cardiovascular;  Laterality: N/A;   LEFT HEART CATH AND CORS/GRAFTS ANGIOGRAPHY N/A 11/06/2023   Procedure: LEFT HEART CATH AND CORS/GRAFTS ANGIOGRAPHY;  Surgeon: Swaziland, Peter M, MD;  Location: [facility]  INVASIVE CV LAB;  Service: Cardiovascular;  Laterality: N/A;   RIGHT HEART CATH N/A 01/21/2017   Procedure: RIGHT HEART CATH;  Surgeon: Jordan Dorn PARAS, MD;  Location: [facility] INVASIVE CV LAB;  Service: Cardiovascular;  Laterality: N/A;   SHOULDER SURGERY     TEE WITHOUT CARDIOVERSION N/A 01/29/2017   Procedure: TRANSESOPHAGEAL ECHOCARDIOGRAM (TEE);  Surgeon: Jordan Hammond, Maude, MD;  Location: [facility] OR;  Service: Open Heart Surgery;  Laterality: N/A;   TRANSCAROTID ARTERY REVASCULARIZATION  Right 01/08/2024   Procedure: TRANSCAROTID ARTERY REVASCULARIZATION (TCAR);  Surgeon: Jordan Debby SAILOR, MD;  Location: [facility] OR;  Service: Vascular;  Laterality: Right;   Patient Active Problem List   Diagnosis Date Noted   Carotid artery stenosis 01/08/2024   Acute CVA (cerebrovascular accident) (HCC) 12/31/2023   AKI (acute kidney injury) 04/24/2022   Hypothyroidism 04/24/2022   Heart murmur 06/12/2018   Low back pain 06/12/2018   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   Chronic kidney disease 12/16/2017   Hyperlipidemia 02/21/2017   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   Chest pain    Bradycardia 01/20/2017   Restless leg syndrome 01/20/2017   Aortic stenosis 01/20/2017   CAD (coronary artery disease), native coronary artery 01/20/2017   Aortic atherosclerosis 01/20/2017   Cramp in lower leg associated with rest 03/05/2016   Colon polyps 08/05/2015   Encounter for screening colonoscopy 05/27/2015  Contusion of left knee 09/08/2014   PUD (peptic ulcer disease) 04/22/2012   S/P Nissen fundoplication (without gastrostomy tube) procedure 04/22/2012   Choking 03/12/2012   Dysphagia 03/12/2012   GERD (gastroesophageal reflux disease) 03/12/2012   Hoarseness of voice 03/12/2012   Gross hematuria 11/11/2009   Prostatitis 11/10/2009   Ureteric stone 11/10/2009   Arthropathy 10/07/2009    ONSET DATE: Referred on 01/01/2024   REFERRING DIAG: R29.90 (ICD-10-CM) - Stroke-like  symptoms  THERAPY DIAG:  Cognitive communication deficit  Rationale for Evaluation and Treatment: Rehabilitation  SUBJECTIVE:   SUBJECTIVE STATEMENT:  I am doing better.  Pt accompanied by: self  PERTINENT HISTORY: 83 y.o. Hammond with CAD s/p CABG, HTN, HLD, AS s/p AVR, CKD and hypothyroidism who presented with acute left facial droop and weakness.  PAIN:  Are you having pain? No  FALLS: Has patient fallen in last 6 months?  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  DIAGNOSTIC FINDINGS: Per EMR:   MR BRAIN WO CONTRAST IMPRESSION: 1. Acute small focal cortical/subcortical infarct in the right posterior frontal lobe.     Electronically Signed   By: Jordan Hammond Hammond.D.   On: 12/31/2023 16:49  COGNITION: Overall cognitive status: Impaired Areas of impairment:  Attention: Impaired: Alternating, Divided Memory: Impaired: Short term Prospective Auditory Executive function: Impaired: Problem solving, Organization, Planning, Self-correction, and Slow processing Functional deficits: See clinical impression  COGNITIVE COMMUNICATION: Following directions: Follows multi-step commands inconsistently  Auditory comprehension: Impaired: likely 2/2 to attention Verbal expression: Impaired: reports anomia Functional communication: Impaired: see clinical impression  ORAL MOTOR EXAMINATION: Overall status: WFL Comments: NA  STANDARDIZED ASSESSMENTS:   Cognitive Linguistic Quick Test: AGE - 18 - 69   The Cognitive Linguistic Quick Test (CLQT) was administered to assess the relative status of five cognitive domains: attention, memory, language, executive  functioning, and visuospatial skills. Scores from 10 tasks were used to estimate severity ratings (standardized for age groups 18-69 years and 70-89 years) for each domain, a clock drawing task, as well as an overall composite severity rating of cognition.       Task Score Criterion Cut Scores  Personal Facts 8/8 8  Symbol Cancellation 12/12 11  Confrontation Naming 10/10 10  Clock Drawing  7/13 12  Story Retelling 5/10 6  Symbol Trails 8/10 9  Generative Naming 6/9 5  Design Memory 6/6 5  Mazes  4/8 7  Design Generation 7/13 6    Cognitive Domain Composite Score Severity Rating  Attention 177/215 WNL  Memory 152/185 WNL  Executive Function 25/40 WNL  Language 29/37 WNL  Visuospatial Skills 83/105 WNL  Clock Drawing  7/13 Moderate  Composite Severity Rating  WNL       PATIENT REPORTED OUTCOME MEASURES (PROM): Neuro QOL - Cognition: 99/140  TREATMENT DATE:   03/05/24: Pt was seen for skilled ST services targeting cognitive-communication. Pt reports he was able to practice using strategies; specifically, pausing with a purpose. He reports he practiced using it on his wife, Jordan Hammond. He reports it was able to bring his thought back most of the time. Continued with education on word finding strategies re: association, synonym, first letter, gestures, draw, look it up, narrow it down, and come back later. PREP method education scheduled for next session re: structuring conversations.   02/26/24: Pt was seen for skilled ST services targeting cognitive-communication. Pt reports he gets off topic and goes on longer and forgets where he started.SLP gleaned he was describing difficulties with topic maintenance and thought organization - SLP encouraged scripting before phone calls to assist with organization. Briefly education on eliminating distractions during  conversations. To provide additional thought organization strategies. SLP initiated education on word retrieval strategies re: delay (pause with purpose) and describe (circumlocution). To cont with word finding strategies next session. Pt to practice delay and scripting for HEP.    02/19/24: Pt was seen for skilled ST services targeting continued assessment, PROM completion, and education. SLP reviewed scores on CLQT - see above - and utilized PROM to assist with goal setting. 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 Based off scores and discussions, we have decided to target word retrieval and memory strategies to support cognitive-communication. Pt verbalized understanding. To initiate strategy education next session.     PATIENT EDUCATION: Education details: Slp role in cognitive communication Person educated: Patient Education method: Explanation Education comprehension: verbalized understanding and needs further education   GOALS: Goals reviewed with patient? No  SHORT TERM GOALS: Target date: 03/15/24  Complete CLQT, PROM, and update goals.  Baseline: Goal status: MET  2.  Pt will verbalize two strategies to support recall of important information in conversations/home/community. Baseline:  Goal status: INITIAL  3.  Pt will verbalize 2 strategies to encourage word retrieval in conversations.  Baseline:  Goal status: INITIAL    LONG TERM GOALS: Target date: 04/15/24  Improve score on PROM Baseline:  Goal status: INITIAL  2.  Pt will provide examples of strategy implementation in supporting recall of important information in conversations/home/community. Baseline:  Goal status: INITIAL  3.  Pt will report and/or demonstrate use of anomia compensations in unstructured conversation. Baseline:  Goal status:  INITIAL    ASSESSMENT:  CLINICAL IMPRESSION: Pt is a 83 yo male who presented to ST OP for evaluation post CVA.  SEE TX NOTE. SLP rec skilled ST services to address cognitive-communication impairment.    OBJECTIVE IMPAIRMENTS: include attention, memory, and executive functioning. These impairments are limiting patient from managing medications, managing appointments, managing finances, household responsibilities, and effectively communicating at home and in community. Factors affecting potential to achieve goals and functional outcome are NA.SABRA Patient will benefit from skilled SLP services to address above impairments and improve overall function.  REHAB POTENTIAL: Good  PLAN:  SLP FREQUENCY: 1x/week  SLP DURATION: 8 weeks  PLANNED INTERVENTIONS: Environmental controls, Cueing hierachy, Cognitive reorganization, Internal/external aids, Functional tasks, SLP instruction and feedback, Compensatory strategies, Patient/family education, and [id number] Treatment of speech (30 or 45 min)     [employer], CCC-SLP 03/05/2024, 11:02 AM

## 2024-03-10 NOTE — Progress Notes (Signed)
 HPI: Follow-up coronary artery disease, aortic stenosis status post AVR and hyperlipidemia. Patient is status post coronary artery bypass and graft with a LIMA to the LAD, saphenous vein graft to the obtuse marginal and aortic valve replacement with pericardial tissue valve in 2018. Echocardiogram repeated May 2025 and showed normal LV function, prior aortic valve replacement with mean gradient 6 mmHg, no aortic insufficiency and trace mitral regurgitation.  Stress PET May 2025 showed ejection fraction 55% and normal perfusion.  Cardiac catheterization May 2025 showed 50% left main, 90% mid LAD, occluded circumflex, 25% right coronary artery, patent LIMA to the LAD and patent saphenous vein graft to the obtuse marginal.  Admitted with acute CVA July 2025.  Carotid Dopplers revealed 40 to 59% right carotid stenosis.  Brain MRI July 2025 showed acute small focal infarct in the right posterior frontal lobe.  CVA felt likely atheroembolic from high risk right internal carotid artery plaque.  Patient ultimately underwent right transcarotid artery revascularization.  Carotid Dopplers September 2025 showed patent stent in the right carotid artery and 1 to 39% left.  Monitor September 2025 showed sinus rhythm with occasional PVC and 8 beats nonsustained ventricular tachycardia.  Since last seen the patient denies any dyspnea on exertion, orthopnea, PND, pedal edema, palpitations, syncope or chest pain.   Current Outpatient Medications  Medication Sig Dispense Refill   acetaminophen  (TYLENOL ) 325 MG tablet Take 650 mg by mouth in the morning.     amoxicillin  (AMOXIL ) 500 MG capsule Take 2 capsules (1,000 mg total) by mouth 2 (two) times daily. 40 capsule 0   Ascorbic Acid (VITAMIN C PO) Take 1 tablet by mouth daily.     aspirin  EC 81 MG EC tablet Take 1 tablet (81 mg total) by mouth daily.     atorvastatin  (LIPITOR) 20 MG tablet Take 1 tablet (20 mg total) by mouth daily. (Patient taking differently: Take 20  mg by mouth at bedtime.) 90 tablet 0   benzonatate  (TESSALON ) 100 MG capsule Take 1 capsule (100 mg total) by mouth every 8 (eight) hours. 21 capsule 0   Calcium  Carb-Cholecalciferol (CALCIUM  + VITAMIN D3 PO) Take 1 tablet by mouth daily.     Cholecalciferol (VITAMIN D -3 PO) Take 1 capsule by mouth daily.     clonazePAM  (KLONOPIN ) 1 MG tablet Take 1 mg by mouth at bedtime.     clopidogrel  (PLAVIX ) 75 MG tablet Take 1 tablet (75 mg total) by mouth daily. 30 tablet 0   Coenzyme Q10 (CO Q 10 PO) Take 1 capsule by mouth daily.     Cyanocobalamin  (VITAMIN B-12 PO) Take 1 tablet by mouth daily.     docusate sodium  (COLACE) 100 MG capsule Take 100-200 mg by mouth See admin instructions. Take 2 capsules (200mg ) by mouth every morning and take 1-2 capsules (100-200mg ) at night as needed for constipation.     famotidine  (PEPCID ) 20 MG tablet Take 20 mg by mouth daily as needed for heartburn or indigestion.     Flaxseed, Linseed, (FLAX SEED OIL PO) Take 2 capsules by mouth daily.     Homeopathic Products (LEG CRAMPS) TABS Take 1 tablet by mouth at bedtime. Magnesium      levothyroxine  (SYNTHROID ) 50 MCG tablet Take 50 mcg by mouth daily before breakfast.     nitroGLYCERIN  (NITROSTAT ) 0.4 MG SL tablet Place 1 tablet (0.4 mg total) under the tongue every 5 (five) minutes as needed for chest pain. Do not take nitroglycerine within 48 hours of taking viagra 25  tablet 3   OVER THE COUNTER MEDICATION Place 1-2 sprays into the nose daily as needed (allergies). OTC allergy nasal spray     oxyCODONE  (ROXICODONE ) 5 MG immediate release tablet Take 1 tablet (5 mg total) by mouth every 6 (six) hours as needed for severe pain (pain score 7-10). 10 tablet 0   Simethicone  (GAS FREE EXTRA STRENGTH PO) Take 1-2 tablets by mouth daily as needed (gas).     traZODone  (DESYREL ) 50 MG tablet Take 50 mg by mouth at bedtime.     No current facility-administered medications for this visit.     Past Medical History:  Diagnosis  Date   Aortic atherosclerosis 01/20/2017   Aortic stenosis 01/20/2017   Arthritis    CAD (coronary artery disease), native coronary artery 01/20/2017   GERD (gastroesophageal reflux disease)    Hiatal hernia    History of kidney stones    Hyperlipidemia    Hypothyroidism    Restless leg syndrome    Stroke [facility])     Past Surgical History:  Procedure Laterality Date   AORTIC VALVE REPLACEMENT N/A 01/29/2017   Procedure: AORTIC VALVE REPLACEMENT (AVR);  Surgeon: Fleeta Ochoa, Maude, MD;  Location: [facility] OR;  Service: Open Heart Surgery;  Laterality: N/A;   APPENDECTOMY     BLEPHAROPLASTY Bilateral    bowel obstruction surgery     CATARACT EXTRACTION Bilateral 2018   CHOLECYSTECTOMY     CORONARY ARTERY BYPASS GRAFT N/A 01/29/2017   Procedure: CORONARY ARTERY BYPASS GRAFTING (CABG)  x two, using left internal mammary artery and right leg greater saphenous vein harvested endoscopically;  Surgeon: Fleeta Ochoa Maude, MD;  Location: [facility] OR;  Service: Open Heart Surgery;  Laterality: N/A;   KNEE ARTHROSCOPY     lap nissan     LEFT HEART CATH AND CORONARY ANGIOGRAPHY N/A 01/21/2017   Procedure: LEFT HEART CATH AND CORONARY ANGIOGRAPHY;  Surgeon: Court Dorn PARAS, MD;  Location: [facility] INVASIVE CV LAB;  Service: Cardiovascular;  Laterality: N/A;   LEFT HEART CATH AND CORS/GRAFTS ANGIOGRAPHY N/A 11/06/2023   Procedure: LEFT HEART CATH AND CORS/GRAFTS ANGIOGRAPHY;  Surgeon: Swaziland, Peter M, MD;  Location: [facility] INVASIVE CV LAB;  Service: Cardiovascular;  Laterality: N/A;   RIGHT HEART CATH N/A 01/21/2017   Procedure: RIGHT HEART CATH;  Surgeon: Court Dorn PARAS, MD;  Location: [facility] INVASIVE CV LAB;  Service: Cardiovascular;  Laterality: N/A;   SHOULDER SURGERY     TEE WITHOUT CARDIOVERSION N/A 01/29/2017   Procedure: TRANSESOPHAGEAL ECHOCARDIOGRAM (TEE);  Surgeon: Fleeta Ochoa, Maude, MD;  Location: [facility] OR;  Service: Open Heart Surgery;  Laterality: N/A;   TRANSCAROTID ARTERY REVASCULARIZATION  Right 01/08/2024    Procedure: TRANSCAROTID ARTERY REVASCULARIZATION (TCAR);  Surgeon: Magda Debby SAILOR, MD;  Location: [facility] OR;  Service: Vascular;  Laterality: Right;    Social History   Socioeconomic History   Marital status: Married    Spouse name: Not on file   Number of children: Not on file   Years of education: Not on file   Highest education level: Not on file  Occupational History   Not on file  Tobacco Use   Smoking status: Never   Smokeless tobacco: Never  Vaping Use   Vaping status: Never Used  Substance and Sexual Activity   Alcohol use: No   Drug use: No   Sexual activity: Yes  Other Topics Concern   Not on file  Social History Narrative   Divorced, remarried.  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   Social Drivers of Corporate investment banker Strain: Not on file  Food Insecurity: No Food Insecurity (01/08/2024)   Hunger Vital Sign    Worried About Running Out of Food in the Last Year: Never true    Ran Out of Food in the Last Year: Never true  Transportation Needs: No Transportation Needs (01/08/2024)   PRAPARE - Administrator, Civil Service (Medical): No    Lack of Transportation (Non-Medical): No  Physical Activity: Not on file  Stress: No Stress Concern Present (09/28/2021)   Received from [facility] - Occupational Stress Questionnaire    Feeling of Stress : Not at all  Social Connections: Unknown (01/08/2024)   Social Connection and Isolation Panel    Frequency of Communication with Friends and Family: More than three times a week    Frequency of Social Gatherings with Friends and Family: More than three times a week    Attends Religious Services: More than 4 times per year    Active Member of [employer] or Organizations: Patient declined    Attends Banker Meetings: Patient declined    Marital Status: Married  Catering manager Violence: Not At Risk (01/08/2024)    Humiliation, Afraid, Rape, and Kick questionnaire    Fear of Current or Ex-Partner: No    Emotionally Abused: No    Physically Abused: No    Sexually Abused: No    Family History  Problem Relation Age of Onset   CAD Father        s/p triple bypass   Dementia Mother     ROS: no fevers or chills, productive cough, hemoptysis, dysphasia, odynophagia, melena, hematochezia, dysuria, hematuria, rash, seizure activity, orthopnea, PND, pedal edema, claudication. Remaining systems are negative.  Physical Exam: Well-developed well-nourished in no acute distress.  Skin is warm and dry.  HEENT is normal.  Neck is supple.  Chest is clear to auscultation with normal expansion.  Cardiovascular exam is regular rate and rhythm.  Abdominal exam nontender or distended. No masses palpated. Extremities show no edema. neuro grossly intact   A/P  1 coronary artery disease status post coronary artery bypass and graft-continue aspirin  and statin.  Recent cardiac catheterization revealed patent grafts.  Plan is medical therapy.  2 status post CVA-patient ultimately underwent right TCAR.  Continue aspirin  and statin.    3 history of aortic valve replacement-normally functioning valve on most recent echocardiogram.  Continue SBE prophylaxis.  4 hyperlipidemia-continue statin.  5 preoperative evaluation prior to bilateral upper eyelid blepharoptosis repair and blepharoplasty-given recent carotid stent this has been delayed.  6 dyspnea/dizziness-improved since previous office visit.  Redell Shallow, MD

## 2024-03-11 ENCOUNTER — Encounter: Payer: Self-pay | Admitting: Speech Pathology

## 2024-03-11 ENCOUNTER — Ambulatory Visit: Attending: Family Medicine | Admitting: Speech Pathology

## 2024-03-11 DIAGNOSIS — R41841 Cognitive communication deficit: Secondary | ICD-10-CM | POA: Insufficient documentation

## 2024-03-11 NOTE — Therapy (Signed)
 OUTPATIENT SPEECH LANGUAGE PATHOLOGY TREATMENT NOTE   Patient Name: Jordan Hammond MRN: [id number] DOB:1941/05/08, 83 y.o., male Today's Date: 03/11/2024  PCP: Mercer Garnette Barter, FNP  REFERRING PROVIDER: Jonel Lonni SHAUNNA, MD  END OF SESSION:  End of Session - 03/11/24 1023     Visit Number 5    SLP Start Time 1018    SLP Stop Time  1058    SLP Time Calculation (min) 40 min    Activity Tolerance Patient tolerated treatment well          Past Medical History:  Diagnosis Date   Aortic atherosclerosis 01/20/2017   Aortic stenosis 01/20/2017   Arthritis    CAD (coronary artery disease), native coronary artery 01/20/2017   GERD (gastroesophageal reflux disease)    Hiatal hernia    History of kidney stones    Hyperlipidemia    Hypothyroidism    Restless leg syndrome    Stroke [facility])    Past Surgical History:  Procedure Laterality Date   AORTIC VALVE REPLACEMENT N/A 01/29/2017   Procedure: AORTIC VALVE REPLACEMENT (AVR);  Surgeon: Fleeta Ochoa, Maude, MD;  Location: [facility] OR;  Service: Open Heart Surgery;  Laterality: N/A;   APPENDECTOMY     BLEPHAROPLASTY Bilateral    bowel obstruction surgery     CATARACT EXTRACTION Bilateral 2018   CHOLECYSTECTOMY     CORONARY ARTERY BYPASS GRAFT N/A 01/29/2017   Procedure: CORONARY ARTERY BYPASS GRAFTING (CABG)  x two, using left internal mammary artery and right leg greater saphenous vein harvested endoscopically;  Surgeon: Fleeta Ochoa Maude, MD;  Location: [facility] OR;  Service: Open Heart Surgery;  Laterality: N/A;   KNEE ARTHROSCOPY     lap nissan     LEFT HEART CATH AND CORONARY ANGIOGRAPHY N/A 01/21/2017   Procedure: LEFT HEART CATH AND CORONARY ANGIOGRAPHY;  Surgeon: Court Dorn PARAS, MD;  Location: [facility] INVASIVE CV LAB;  Service: Cardiovascular;  Laterality: N/A;   LEFT HEART CATH AND CORS/GRAFTS ANGIOGRAPHY N/A 11/06/2023   Procedure: LEFT HEART CATH AND CORS/GRAFTS ANGIOGRAPHY;  Surgeon: [place]az[place], Peter M, MD;  Location: [facility]  INVASIVE CV LAB;  Service: Cardiovascular;  Laterality: N/A;   RIGHT HEART CATH N/A 01/21/2017   Procedure: RIGHT HEART CATH;  Surgeon: Court Dorn PARAS, MD;  Location: [facility] INVASIVE CV LAB;  Service: Cardiovascular;  Laterality: N/A;   SHOULDER SURGERY     TEE WITHOUT CARDIOVERSION N/A 01/29/2017   Procedure: TRANSESOPHAGEAL ECHOCARDIOGRAM (TEE);  Surgeon: Fleeta Ochoa, Maude, MD;  Location: [facility] OR;  Service: Open Heart Surgery;  Laterality: N/A;   TRANSCAROTID ARTERY REVASCULARIZATION  Right 01/08/2024   Procedure: TRANSCAROTID ARTERY REVASCULARIZATION (TCAR);  Surgeon: Magda Debby SAILOR, MD;  Location: [facility] OR;  Service: Vascular;  Laterality: Right;   Patient Active Problem List   Diagnosis Date Noted   Carotid artery stenosis 01/08/2024   Acute CVA (cerebrovascular accident) (HCC) 12/31/2023   AKI (acute kidney injury) 04/24/2022   Hypothyroidism 04/24/2022   Heart murmur 06/12/2018   Low back pain 06/12/2018   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   Chronic kidney disease 12/16/2017   Hyperlipidemia 02/21/2017   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   Chest pain    Bradycardia 01/20/2017   Restless leg syndrome 01/20/2017   Aortic stenosis 01/20/2017   CAD (coronary artery disease), native coronary artery 01/20/2017   Aortic atherosclerosis 01/20/2017   Cramp in lower leg associated with rest 03/05/2016   Colon polyps 08/05/2015   Encounter for screening colonoscopy 05/27/2015  Contusion of left knee 09/08/2014   PUD (peptic ulcer disease) 04/22/2012   S/P Nissen fundoplication (without gastrostomy tube) procedure 04/22/2012   Choking 03/12/2012   Dysphagia 03/12/2012   GERD (gastroesophageal reflux disease) 03/12/2012   Hoarseness of voice 03/12/2012   Gross hematuria 11/11/2009   Prostatitis 11/10/2009   Ureteric stone 11/10/2009   Arthropathy 10/07/2009    ONSET DATE: Referred on 01/01/2024   REFERRING DIAG: R29.90 (ICD-10-CM) - Stroke-like  symptoms  THERAPY DIAG:  Cognitive communication deficit  Rationale for Evaluation and Treatment: Rehabilitation  SUBJECTIVE:   SUBJECTIVE STATEMENT:  Pt reports he has been having some issues with lower back.   Pt accompanied by: self  PERTINENT HISTORY: 83 y.o. M with CAD s/p CABG, HTN, HLD, AS s/p AVR, CKD and hypothyroidism who presented with acute left facial droop and weakness.  PAIN:  Are you having pain? No  FALLS: Has patient fallen in last 6 months?  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  DIAGNOSTIC FINDINGS: Per EMR:   MR BRAIN WO CONTRAST IMPRESSION: 1. Acute small focal cortical/subcortical infarct in the right posterior frontal lobe.     Electronically Signed   By: Evalene Coho M.D.   On: 12/31/2023 16:49  COGNITION: Overall cognitive status: Impaired Areas of impairment:  Attention: Impaired: Alternating, Divided Memory: Impaired: Short term Prospective Auditory Executive function: Impaired: Problem solving, Organization, Planning, Self-correction, and Slow processing Functional deficits: See clinical impression  COGNITIVE COMMUNICATION: Following directions: Follows multi-step commands inconsistently  Auditory comprehension: Impaired: likely 2/2 to attention Verbal expression: Impaired: reports anomia Functional communication: Impaired: see clinical impression  ORAL MOTOR EXAMINATION: Overall status: WFL Comments: NA  STANDARDIZED ASSESSMENTS:   Cognitive Linguistic Quick Test: AGE - 18 - 69   The Cognitive Linguistic Quick Test (CLQT) was administered to assess the relative status of five cognitive domains:  attention, memory, language, executive functioning, and visuospatial skills. Scores from 10 tasks were used to estimate severity ratings (standardized for age groups 18-69 years and 70-89 years) for each domain, a clock drawing task, as well as an overall composite severity rating of cognition.       Task Score Criterion Cut Scores  Personal Facts 8/8 8  Symbol Cancellation 12/12 11  Confrontation Naming 10/10 10  Clock Drawing  7/13 12  Story Retelling 5/10 6  Symbol Trails 8/10 9  Generative Naming 6/9 5  Design Memory 6/6 5  Mazes  4/8 7  Design Generation 7/13 6    Cognitive Domain Composite Score Severity Rating  Attention 177/215 WNL  Memory 152/185 WNL  Executive Function 25/40 WNL  Language 29/37 WNL  Visuospatial Skills 83/105 WNL  Clock Drawing  7/13 Moderate  Composite Severity Rating  WNL       PATIENT REPORTED OUTCOME MEASURES (PROM): Neuro QOL - Cognition: 99/140  TREATMENT DATE:   03/11/24: Pt was seen for skilled ST services targeting cognitive-communication. He reports he was able to utilize some of his strategies as he has had some morning meetings with friends. He reports he paused when he needed to and has been working on narrowing it down to provide context and attempt to keep on track. He also asked others to give him a minute when he got stuck. He is feeling more confident when entering into a conversation. Initiated education on Thought Organization strategies re: pause, writing down points (I.e. scripting), using simple language, structuring thoughts (PREP) method, active listening, asking for feedback, practicing regularly, use visual aids, breaking information in chunks, practice active rephrasing. Pt was able to provide relevant examples that pply to his daily life.   03/05/24: Pt was seen for skilled ST services targeting  cognitive-communication. Pt reports he was able to practice using strategies; specifically, pausing with a purpose. He reports he practiced using it on his wife, [name]. He reports it was able to bring his thought back most of the time. Continued with education on word finding strategies re: association, synonym, first letter, gestures, draw, look it up, narrow it down, and come back later. PREP method education scheduled for next session re: structuring conversations.   02/26/24: Pt was seen for skilled ST services targeting cognitive-communication. Pt reports he gets off topic and goes on longer and forgets where he started.SLP gleaned he was describing difficulties with topic maintenance and thought organization - SLP encouraged scripting before phone calls to assist with organization. Briefly education on eliminating distractions during conversations. To provide additional thought organization strategies. SLP initiated education on word retrieval strategies re: delay (pause with purpose) and describe (circumlocution). To cont with word finding strategies next session. Pt to practice delay and scripting for HEP.    02/19/24: Pt was seen for skilled ST services targeting continued assessment, PROM completion, and education. SLP reviewed scores on CLQT - see above - and utilized PROM to assist with goal setting. 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 Based off scores and discussions, we have decided to target word retrieval and memory strategies to support cognitive-communication. Pt verbalized understanding. To initiate strategy education next session.     PATIENT EDUCATION: Education details: Slp role in cognitive communication Person educated: Patient Education method: Explanation Education comprehension: verbalized understanding and needs further  education   GOALS: Goals reviewed with patient? No  SHORT TERM GOALS: Target date: 03/15/24  Complete CLQT, PROM, and update goals.  Baseline: Goal status: MET  2.  Pt will verbalize two strategies to support recall of important information in conversations/home/community. Baseline:  Goal status: INITIAL  3.  Pt will verbalize 2 strategies to encourage word retrieval in conversations.  Baseline:  Goal status: INITIAL    LONG TERM GOALS: Target date: 04/15/24  Improve score on PROM Baseline:  Goal status: INITIAL  2.  Pt will provide examples of strategy implementation in supporting recall of important information in conversations/home/community. Baseline:  Goal status: INITIAL  3.  Pt will report and/or demonstrate use of anomia compensations in unstructured conversation. Baseline:  Goal status: INITIAL    ASSESSMENT:  CLINICAL IMPRESSION: Pt is a 83 yo male who presented to ST OP for evaluation post CVA.  SEE TX NOTE. SLP rec skilled ST services to address cognitive-communication impairment.    OBJECTIVE IMPAIRMENTS: include attention, memory, and executive  functioning. These impairments are limiting patient from managing medications, managing appointments, managing finances, household responsibilities, and effectively communicating at home and in community. Factors affecting potential to achieve goals and functional outcome are NA.SABRA Patient will benefit from skilled SLP services to address above impairments and improve overall function.  REHAB POTENTIAL: Good  PLAN:  SLP FREQUENCY: 1x/week  SLP DURATION: 8 weeks  PLANNED INTERVENTIONS: Environmental controls, Cueing hierachy, Cognitive reorganization, Internal/external aids, Functional tasks, SLP instruction and feedback, Compensatory strategies, Patient/family education, and [id number] Treatment of speech (30 or 45 min)     [employer], CCC-SLP 03/11/2024, 10:24 AM

## 2024-03-13 DIAGNOSIS — I951 Orthostatic hypotension: Secondary | ICD-10-CM | POA: Diagnosis not present

## 2024-03-13 DIAGNOSIS — Z8673 Personal history of transient ischemic attack (TIA), and cerebral infarction without residual deficits: Secondary | ICD-10-CM | POA: Diagnosis not present

## 2024-03-13 DIAGNOSIS — Z7982 Long term (current) use of aspirin: Secondary | ICD-10-CM | POA: Diagnosis not present

## 2024-03-18 ENCOUNTER — Ambulatory Visit: Admitting: Speech Pathology

## 2024-03-24 ENCOUNTER — Encounter: Payer: Self-pay | Admitting: Cardiology

## 2024-03-24 ENCOUNTER — Ambulatory Visit: Attending: Cardiology | Admitting: Cardiology

## 2024-03-24 VITALS — BP 120/50 | HR 60 | Ht 72.0 in | Wt 187.0 lb

## 2024-03-24 DIAGNOSIS — R079 Chest pain, unspecified: Secondary | ICD-10-CM

## 2024-03-24 DIAGNOSIS — E785 Hyperlipidemia, unspecified: Secondary | ICD-10-CM | POA: Diagnosis not present

## 2024-03-24 DIAGNOSIS — I25709 Atherosclerosis of coronary artery bypass graft(s), unspecified, with unspecified angina pectoris: Secondary | ICD-10-CM | POA: Diagnosis not present

## 2024-03-24 DIAGNOSIS — Z952 Presence of prosthetic heart valve: Secondary | ICD-10-CM

## 2024-03-24 NOTE — Patient Instructions (Signed)

## 2024-03-25 ENCOUNTER — Encounter: Payer: Self-pay | Admitting: Speech Pathology

## 2024-03-25 ENCOUNTER — Ambulatory Visit: Admitting: Speech Pathology

## 2024-03-25 DIAGNOSIS — R41841 Cognitive communication deficit: Secondary | ICD-10-CM

## 2024-03-25 NOTE — Therapy (Unsigned)
 OUTPATIENT SPEECH LANGUAGE PATHOLOGY TREATMENT NOTE   Patient Name: Jordan Hammond MRN: [id number] DOB:18-Sep-1940, 83 y.o., male Today's Date: 03/25/2024  PCP: Jordan Garnette Barter, FNP  REFERRING PROVIDER: Jonel Lonni SHAUNNA, MD  END OF SESSION:  End of Session - 03/25/24 1107     Visit Number 6    Date for Recertification  04/15/24    SLP Start Time 1102    SLP Stop Time  1140    SLP Time Calculation (min) 38 min    Activity Tolerance Patient tolerated treatment well          Past Medical History:  Diagnosis Date   Aortic atherosclerosis 01/20/2017   Aortic stenosis 01/20/2017   Arthritis    CAD (coronary artery disease), native coronary artery 01/20/2017   GERD (gastroesophageal reflux disease)    Hiatal hernia    History of kidney stones    Hyperlipidemia    Hypothyroidism    Restless leg syndrome    Stroke [facility])    Past Surgical History:  Procedure Laterality Date   AORTIC VALVE REPLACEMENT N/A 01/29/2017   Procedure: AORTIC VALVE REPLACEMENT (AVR);  Surgeon: Jordan Hammond, Maude, MD;  Location: [facility] OR;  Service: Open Heart Surgery;  Laterality: N/A;   APPENDECTOMY     BLEPHAROPLASTY Bilateral    bowel obstruction surgery     CATARACT EXTRACTION Bilateral 2018   CHOLECYSTECTOMY     CORONARY ARTERY BYPASS GRAFT N/A 01/29/2017   Procedure: CORONARY ARTERY BYPASS GRAFTING (CABG)  x two, using left internal mammary artery and right leg greater saphenous vein harvested endoscopically;  Surgeon: Jordan Hammond Maude, MD;  Location: [facility] OR;  Service: Open Heart Surgery;  Laterality: N/A;   KNEE ARTHROSCOPY     lap nissan     LEFT HEART CATH AND CORONARY ANGIOGRAPHY N/A 01/21/2017   Procedure: LEFT HEART CATH AND CORONARY ANGIOGRAPHY;  Surgeon: Jordan Dorn PARAS, MD;  Location: [facility] INVASIVE CV LAB;  Service: Cardiovascular;  Laterality: N/A;   LEFT HEART CATH AND CORS/GRAFTS ANGIOGRAPHY N/A 11/06/2023   Procedure: LEFT HEART CATH AND CORS/GRAFTS ANGIOGRAPHY;  Surgeon:  Jordan Hammond, Jordan M, MD;  Location: [facility] INVASIVE CV LAB;  Service: Cardiovascular;  Laterality: N/A;   RIGHT HEART CATH N/A 01/21/2017   Procedure: RIGHT HEART CATH;  Surgeon: Jordan Dorn PARAS, MD;  Location: [facility] INVASIVE CV LAB;  Service: Cardiovascular;  Laterality: N/A;   SHOULDER SURGERY     TEE WITHOUT CARDIOVERSION N/A 01/29/2017   Procedure: TRANSESOPHAGEAL ECHOCARDIOGRAM (TEE);  Surgeon: Jordan Hammond, Maude, MD;  Location: [facility] OR;  Service: Open Heart Surgery;  Laterality: N/A;   TRANSCAROTID ARTERY REVASCULARIZATION  Right 01/08/2024   Procedure: TRANSCAROTID ARTERY REVASCULARIZATION (TCAR);  Surgeon: Jordan Debby SAILOR, MD;  Location: [facility] OR;  Service: Vascular;  Laterality: Right;   Patient Active Problem List   Diagnosis Date Noted   Carotid artery stenosis 01/08/2024   Acute CVA (cerebrovascular accident) (HCC) 12/31/2023   AKI (acute kidney injury) 04/24/2022   Hypothyroidism 04/24/2022   Heart murmur 06/12/2018   Low back pain 06/12/2018   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   Chronic kidney disease 12/16/2017   Hyperlipidemia 02/21/2017   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   Chest pain    Bradycardia 01/20/2017   Restless leg syndrome 01/20/2017   Aortic stenosis 01/20/2017   CAD (coronary artery disease), native coronary artery 01/20/2017   Aortic atherosclerosis 01/20/2017   Cramp in lower leg associated with rest 03/05/2016   Colon polyps 08/05/2015  Encounter for screening colonoscopy 05/27/2015   Contusion of left knee 09/08/2014   PUD (peptic ulcer disease) 04/22/2012   S/P Nissen fundoplication (without gastrostomy tube) procedure 04/22/2012   Choking 03/12/2012   Dysphagia 03/12/2012   GERD (gastroesophageal reflux disease) 03/12/2012   Hoarseness of voice 03/12/2012   Gross hematuria 11/11/2009   Prostatitis 11/10/2009   Ureteric stone 11/10/2009   Arthropathy 10/07/2009    ONSET DATE: Referred on 01/01/2024   REFERRING DIAG:  R29.90 (ICD-10-CM) - Stroke-like symptoms  THERAPY DIAG:  Cognitive communication deficit  Rationale for Evaluation and Treatment: Rehabilitation  SUBJECTIVE:   SUBJECTIVE STATEMENT:  Pt reports he is doing really good.   Pt accompanied by: self  PERTINENT HISTORY: 83 y.o. Hammond with CAD s/p CABG, HTN, HLD, AS s/p AVR, CKD and hypothyroidism who presented with acute left facial droop and weakness.  PAIN:  Are you having pain? No  FALLS: Has patient fallen in last 6 months?  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  DIAGNOSTIC FINDINGS: Per EMR:   MR BRAIN WO CONTRAST IMPRESSION: 1. Acute small focal cortical/subcortical infarct in the right posterior frontal lobe.     Electronically Signed   By: Jordan Hammond Hammond.D.   On: 12/31/2023 16:49  COGNITION: Overall cognitive status: Impaired Areas of impairment:  Attention: Impaired: Alternating, Divided Memory: Impaired: Short term Prospective Auditory Executive function: Impaired: Problem solving, Organization, Planning, Self-correction, and Slow processing Functional deficits: See clinical impression  COGNITIVE COMMUNICATION: Following directions: Follows multi-step commands inconsistently  Auditory comprehension: Impaired: likely 2/2 to attention Verbal expression: Impaired: reports anomia Functional communication: Impaired: see clinical impression  ORAL MOTOR EXAMINATION: Overall status: WFL Comments: NA  STANDARDIZED ASSESSMENTS:   Cognitive Linguistic Quick Test: AGE - 18 - 69   The Cognitive Linguistic Quick Test (CLQT) was administered to assess the relative status of five  cognitive domains: attention, memory, language, executive functioning, and visuospatial skills. Scores from 10 tasks were used to estimate severity ratings (standardized for age groups 18-69 years and 70-89 years) for each domain, a clock drawing task, as well as an overall composite severity rating of cognition.       Task Score Criterion Cut Scores  Personal Facts 8/8 8  Symbol Cancellation 12/12 11  Confrontation Naming 10/10 10  Clock Drawing  7/13 12  Story Retelling 5/10 6  Symbol Trails 8/10 9  Generative Naming 6/9 5  Design Memory 6/6 5  Mazes  4/8 7  Design Generation 7/13 6    Cognitive Domain Composite Score Severity Rating  Attention 177/215 WNL  Memory 152/185 WNL  Executive Function 25/40 WNL  Language 29/37 WNL  Visuospatial Skills 83/105 WNL  Clock Drawing  7/13 Moderate  Composite Severity Rating  WNL       PATIENT REPORTED OUTCOME MEASURES (PROM): Neuro QOL - Cognition: 99/140  TREATMENT DATE:   03/25/24: Pt was seen for skilled ST services targeting cognitive-communication. He reports things have been going really good - he is more conscious about his conversations and how to use strategies in conversation. Used delay successfully in instance of anomia yesterday. He reports he has been redirected himself and his conversations when he gets off topic. Continued education on throughout organization strategies re: practicing regularly, using visuals (at work), breaking information into chunks, and practicing active rephrasing.  To begin memory strategies next session.   03/11/24: Pt was seen for skilled ST services targeting cognitive-communication. He reports he was able to utilize some of his strategies as he has had some morning meetings with friends. He reports he paused when he needed to and has been working on narrowing it down to  provide context and attempt to keep on track. He also asked others to give him a minute when he got stuck. He is feeling more confident when entering into a conversation. Initiated education on Thought Organization strategies re: pause, writing down points (I.e. scripting), using simple language, structuring thoughts (PREP) method, active listening, asking for feedback, practicing regularly, use visual aids, breaking information in chunks, practice active rephrasing. Pt was able to provide relevant examples that pply to his daily life.   03/05/24: Pt was seen for skilled ST services targeting cognitive-communication. Pt reports he was able to practice using strategies; specifically, pausing with a purpose. He reports he practiced using it on his wife, Jordan Hammond. He reports it was able to bring his thought back most of the time. Continued with education on word finding strategies re: association, synonym, first letter, gestures, draw, look it up, narrow it down, and come back later. PREP method education scheduled for next session re: structuring conversations.   02/26/24: Pt was seen for skilled ST services targeting cognitive-communication. Pt reports he gets off topic and goes on longer and forgets where he started.SLP gleaned he was describing difficulties with topic maintenance and thought organization - SLP encouraged scripting before phone calls to assist with organization. Briefly education on eliminating distractions during conversations. To provide additional thought organization strategies. SLP initiated education on word retrieval strategies re: delay (pause with purpose) and describe (circumlocution). To cont with word finding strategies next session. Pt to practice delay and scripting for HEP.    02/19/24: Pt was seen for skilled ST services targeting continued assessment, PROM completion, and education. SLP reviewed scores on CLQT - see above - and utilized PROM to assist with goal setting. 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 Based off scores and discussions, we have decided to target word retrieval and memory strategies to support cognitive-communication. Pt verbalized understanding. To initiate strategy education next session.     PATIENT EDUCATION: Education details: Slp role in cognitive communication Person educated: Patient Education method: Explanation Education comprehension: verbalized understanding and needs further education   GOALS: Goals reviewed with patient? No  SHORT TERM GOALS: Target date: 03/15/24  Complete CLQT, PROM, and update goals.  Baseline: Goal status: MET  2.  Pt will verbalize two strategies to support recall of important information in conversations/home/community. Baseline:  Goal status: NOT MET  3.  Pt will verbalize 2 strategies to encourage word retrieval in conversations.  Baseline:  Goal status: MET    LONG TERM GOALS: Target date: 04/15/24  Improve score on PROM Baseline:  Goal status: INITIAL  2.  Pt will provide examples of strategy implementation in supporting recall of important information in conversations/home/community. Baseline:  Goal status: INITIAL  3.  Pt will report and/or demonstrate use of anomia compensations in unstructured conversation. Baseline:  Goal status: INITIAL    ASSESSMENT:  CLINICAL IMPRESSION: Pt is a 83 yo male who presented to ST OP for evaluation post CVA.  SEE TX NOTE. SLP rec skilled ST services to address cognitive-communication impairment.    OBJECTIVE IMPAIRMENTS: include attention, memory, and executive functioning. These impairments are limiting patient from managing medications, managing appointments, managing finances, household responsibilities, and effectively communicating at home and in community. Factors affecting potential to achieve  goals and functional outcome are NA.SABRA Patient will benefit from skilled SLP services to address above impairments and improve overall function.  REHAB POTENTIAL: Good  PLAN:  SLP FREQUENCY: 1x/week  SLP DURATION: 8 weeks  PLANNED INTERVENTIONS: Environmental controls, Cueing hierachy, Cognitive reorganization, Internal/external aids, Functional tasks, SLP instruction and feedback, Compensatory strategies, Patient/family education, and [id number] Treatment of speech (30 or 45 min)     [employer], CCC-SLP 03/25/2024, 11:08 AM

## 2024-04-01 ENCOUNTER — Encounter: Payer: Self-pay | Admitting: Speech Pathology

## 2024-04-01 ENCOUNTER — Ambulatory Visit: Admitting: Speech Pathology

## 2024-04-01 DIAGNOSIS — R41841 Cognitive communication deficit: Secondary | ICD-10-CM | POA: Diagnosis not present

## 2024-04-01 NOTE — Therapy (Signed)
 OUTPATIENT SPEECH LANGUAGE PATHOLOGY TREATMENT NOTE   Patient Name: Jordan Hammond MRN: [id number] DOB:[birth year], 83 y.o., male Today's Date: 04/01/2024  PCP: Mercer Garnette Barter, FNP  REFERRING PROVIDER: Jonel Lonni SHAUNNA, MD  END OF SESSION:  End of Session - 04/01/24 1105     Visit Number 7    Date for Recertification  04/15/24    SLP Start Time 1100    SLP Stop Time  1140    SLP Time Calculation (min) 40 min    Activity Tolerance Patient tolerated treatment well          Past Medical History:  Diagnosis Date   Aortic atherosclerosis 01/20/2017   Aortic stenosis 01/20/2017   Arthritis    CAD (coronary artery disease), native coronary artery 01/20/2017   GERD (gastroesophageal reflux disease)    Hiatal hernia    History of kidney stones    Hyperlipidemia    Hypothyroidism    Restless leg syndrome    Stroke [facility]    Past Surgical History:  Procedure Laterality Date   AORTIC VALVE REPLACEMENT N/A 01/29/2017   Procedure: AORTIC VALVE REPLACEMENT (AVR);  Surgeon: Fleeta Ochoa, Maude, MD;  Location: [facility] OR;  Service: Open Heart Surgery;  Laterality: N/A;   APPENDECTOMY     BLEPHAROPLASTY Bilateral    bowel obstruction surgery     CATARACT EXTRACTION Bilateral 2018   CHOLECYSTECTOMY     CORONARY ARTERY BYPASS GRAFT N/A 01/29/2017   Procedure: CORONARY ARTERY BYPASS GRAFTING (CABG)  x two, using left internal mammary artery and right leg greater saphenous vein harvested endoscopically;  Surgeon: Fleeta Ochoa Maude, MD;  Location: [facility] OR;  Service: Open Heart Surgery;  Laterality: N/A;   KNEE ARTHROSCOPY     lap nissan     LEFT HEART CATH AND CORONARY ANGIOGRAPHY N/A 01/21/2017   Procedure: LEFT HEART CATH AND CORONARY ANGIOGRAPHY;  Surgeon: Court Dorn PARAS, MD;  Location: [facility] INVASIVE CV LAB;  Service: Cardiovascular;  Laterality: N/A;   LEFT HEART CATH AND CORS/GRAFTS ANGIOGRAPHY N/A 11/06/2023   Procedure: LEFT HEART CATH AND CORS/GRAFTS ANGIOGRAPHY;  Surgeon:  [place], Peter M, MD;  Location: [facility] INVASIVE CV LAB;  Service: Cardiovascular;  Laterality: N/A;   RIGHT HEART CATH N/A 01/21/2017   Procedure: RIGHT HEART CATH;  Surgeon: Court Dorn PARAS, MD;  Location: [facility] INVASIVE CV LAB;  Service: Cardiovascular;  Laterality: N/A;   SHOULDER SURGERY     TEE WITHOUT CARDIOVERSION N/A 01/29/2017   Procedure: TRANSESOPHAGEAL ECHOCARDIOGRAM (TEE);  Surgeon: Fleeta Ochoa, Maude, MD;  Location: [facility] OR;  Service: Open Heart Surgery;  Laterality: N/A;   TRANSCAROTID ARTERY REVASCULARIZATION  Right 01/08/2024   Procedure: TRANSCAROTID ARTERY REVASCULARIZATION (TCAR);  Surgeon: Magda Debby SAILOR, MD;  Location: [facility] OR;  Service: Vascular;  Laterality: Right;   Patient Active Problem List   Diagnosis Date Noted   Carotid artery stenosis 01/08/2024   Acute CVA (cerebrovascular accident) (HCC) 12/31/2023   AKI (acute kidney injury) 04/24/2022   Hypothyroidism 04/24/2022   Heart murmur 06/12/2018   Low back pain 06/12/2018   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   Chronic kidney disease 12/16/2017   Hyperlipidemia 02/21/2017   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   Chest pain    Bradycardia 01/20/2017   Restless leg syndrome 01/20/2017   Aortic stenosis 01/20/2017   CAD (coronary artery disease), native coronary artery 01/20/2017   Aortic atherosclerosis 01/20/2017   Cramp in lower leg associated with rest 03/05/2016   Colon polyps 08/05/2015  Encounter for screening colonoscopy 05/27/2015   Contusion of left knee 09/08/2014   PUD (peptic ulcer disease) 04/22/2012   S/P Nissen fundoplication (without gastrostomy tube) procedure 04/22/2012   Choking 03/12/2012   Dysphagia 03/12/2012   GERD (gastroesophageal reflux disease) 03/12/2012   Hoarseness of voice 03/12/2012   Gross hematuria 11/11/2009   Prostatitis 11/10/2009   Ureteric stone 11/10/2009   Arthropathy 10/07/2009    ONSET DATE: Referred on 01/01/2024   REFERRING DIAG:  R29.90 (ICD-10-CM) - Stroke-like symptoms  THERAPY DIAG:  Cognitive communication deficit  Rationale for Evaluation and Treatment: Rehabilitation  SUBJECTIVE:   SUBJECTIVE STATEMENT:  Pt reports he is doing really good.   Pt accompanied by: self  PERTINENT HISTORY: 83 y.o. M with CAD s/p CABG, HTN, HLD, AS s/p AVR, CKD and hypothyroidism who presented with acute left facial droop and weakness.  PAIN:  Are you having pain? No  FALLS: Has patient fallen in last 6 months?  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  DIAGNOSTIC FINDINGS: Per EMR:   MR BRAIN WO CONTRAST IMPRESSION: 1. Acute small focal cortical/subcortical infarct in the right posterior frontal lobe.     Electronically Signed   By: Evalene Coho M.D.   On: 12/31/2023 16:49  COGNITION: Overall cognitive status: Impaired Areas of impairment:  Attention: Impaired: Alternating, Divided Memory: Impaired: Short term Prospective Auditory Executive function: Impaired: Problem solving, Organization, Planning, Self-correction, and Slow processing Functional deficits: See clinical impression  COGNITIVE COMMUNICATION: Following directions: Follows multi-step commands inconsistently  Auditory comprehension: Impaired: likely 2/2 to attention Verbal expression: Impaired: reports anomia Functional communication: Impaired: see clinical impression  ORAL MOTOR EXAMINATION: Overall status: WFL Comments: NA  STANDARDIZED ASSESSMENTS:   Cognitive Linguistic Quick Test: AGE - 18 - 69   The Cognitive Linguistic Quick Test (CLQT) was administered to assess the relative status of five  cognitive domains: attention, memory, language, executive functioning, and visuospatial skills. Scores from 10 tasks were used to estimate severity ratings (standardized for age groups 18-69 years and 70-89 years) for each domain, a clock drawing task, as well as an overall composite severity rating of cognition.       Task Score Criterion Cut Scores  Personal Facts 8/8 8  Symbol Cancellation 12/12 11  Confrontation Naming 10/10 10  Clock Drawing  7/13 12  Story Retelling 5/10 6  Symbol Trails 8/10 9  Generative Naming 6/9 5  Design Memory 6/6 5  Mazes  4/8 7  Design Generation 7/13 6    Cognitive Domain Composite Score Severity Rating  Attention 177/215 WNL  Memory 152/185 WNL  Executive Function 25/40 WNL  Language 29/37 WNL  Visuospatial Skills 83/105 WNL  Clock Drawing  7/13 Moderate  Composite Severity Rating  WNL       PATIENT REPORTED OUTCOME MEASURES (PROM): Neuro QOL - Cognition: 99/140  TREATMENT DATE:   04/01/24: Pt was seen for skilled ST services targeting cognitive-communication. SLP initiated education on memory strategies. SLP educated on the following re: external memory strategies (routine - coffee and calendars, written repetition, lists, timers, medication boxes, putting items in the same place, smart devices - voice memos). Pt reports he does not have trouble keeping up with appointments at this time; however, he feels like he may not remember something [name] tells him (grocery list etc.). He shared he has used an acronym (LDS) to remember items at the grocery store. To complete internal memory strategies and discharge next session.   03/25/24: Pt was seen for skilled ST services targeting cognitive-communication. He reports things have been going really good - he is more conscious about his conversations and how to use strategies in  conversation. Used delay successfully in instance of anomia yesterday. He reports he has been redirected himself and his conversations when he gets off topic. Continued education on throughout organization strategies re: practicing regularly, using visuals (at work), breaking information into chunks, and practicing active rephrasing.  To begin memory strategies next session.   03/11/24: Pt was seen for skilled ST services targeting cognitive-communication. He reports he was able to utilize some of his strategies as he has had some morning meetings with friends. He reports he paused when he needed to and has been working on narrowing it down to provide context and attempt to keep on track. He also asked others to give him a minute when he got stuck. He is feeling more confident when entering into a conversation. Initiated education on Thought Organization strategies re: pause, writing down points (I.e. scripting), using simple language, structuring thoughts (PREP) method, active listening, asking for feedback, practicing regularly, use visual aids, breaking information in chunks, practice active rephrasing. Pt was able to provide relevant examples that pply to his daily life.   03/05/24: Pt was seen for skilled ST services targeting cognitive-communication. Pt reports he was able to practice using strategies; specifically, pausing with a purpose. He reports he practiced using it on his wife, [name]. He reports it was able to bring his thought back most of the time. Continued with education on word finding strategies re: association, synonym, first letter, gestures, draw, look it up, narrow it down, and come back later. PREP method education scheduled for next session re: structuring conversations.   02/26/24: Pt was seen for skilled ST services targeting cognitive-communication. Pt reports he gets off topic and goes on longer and forgets where he started.SLP gleaned he was describing difficulties with  topic maintenance and thought organization - SLP encouraged scripting before phone calls to assist with organization. Briefly education on eliminating distractions during conversations. To provide additional thought organization strategies. SLP initiated education on word retrieval strategies re: delay (pause with purpose) and describe (circumlocution). To cont with word finding strategies next session. Pt to practice delay and scripting for HEP.    02/19/24: Pt was seen for skilled ST services targeting continued assessment, PROM completion, and education. SLP reviewed scores on CLQT - see above - and utilized PROM to assist with goal setting. 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 Based off scores and discussions, we have decided to target word retrieval and memory strategies to support cognitive-communication. Pt verbalized understanding. To initiate strategy education next session.     PATIENT EDUCATION: Education details: Slp role in cognitive communication Person  educated: Patient Education method: Explanation Education comprehension: verbalized understanding and needs further education   GOALS: Goals reviewed with patient? No  SHORT TERM GOALS: Target date: 03/15/24  Complete CLQT, PROM, and update goals.  Baseline: Goal status: MET  2.  Pt will verbalize two strategies to support recall of important information in conversations/home/community. Baseline:  Goal status: NOT MET  3.  Pt will verbalize 2 strategies to encourage word retrieval in conversations.  Baseline:  Goal status: MET    LONG TERM GOALS: Target date: 04/15/24  Improve score on PROM Baseline:  Goal status: INITIAL  2.  Pt will provide examples of strategy implementation in supporting recall of important information in conversations/home/community. Baseline:  Goal  status: INITIAL  3.  Pt will report and/or demonstrate use of anomia compensations in unstructured conversation. Baseline:  Goal status: INITIAL    ASSESSMENT:  CLINICAL IMPRESSION: Pt is a 83 yo male who presented to ST OP for evaluation post CVA.  SEE TX NOTE. SLP rec skilled ST services to address cognitive-communication impairment.    OBJECTIVE IMPAIRMENTS: include attention, memory, and executive functioning. These impairments are limiting patient from managing medications, managing appointments, managing finances, household responsibilities, and effectively communicating at home and in community. Factors affecting potential to achieve goals and functional outcome are NA.SABRA Patient will benefit from skilled SLP services to address above impairments and improve overall function.  REHAB POTENTIAL: Good  PLAN:  SLP FREQUENCY: 1x/week  SLP DURATION: 8 weeks  PLANNED INTERVENTIONS: Environmental controls, Cueing hierachy, Cognitive reorganization, Internal/external aids, Functional tasks, SLP instruction and feedback, Compensatory strategies, Patient/family education, and [id number] Treatment of speech (30 or 45 min)     [employer], CCC-SLP 04/01/2024, 11:06 AM

## 2024-04-08 ENCOUNTER — Ambulatory Visit: Admitting: Speech Pathology

## 2024-04-08 ENCOUNTER — Encounter: Payer: Self-pay | Admitting: Speech Pathology

## 2024-04-08 DIAGNOSIS — R41841 Cognitive communication deficit: Secondary | ICD-10-CM | POA: Diagnosis not present

## 2024-04-08 NOTE — Therapy (Signed)
 OUTPATIENT SPEECH LANGUAGE PATHOLOGY TREATMENT NOTE & DISCHARGE SUMMARY   Patient Name: Jordan Hammond MRN: [id number] DOB:June 16, 1940, 83 y.o., male Today's Date: 04/08/2024  PCP: Mercer Garnette Barter, FNP  REFERRING PROVIDER: Jonel Lonni SHAUNNA, MD  SPEECH THERAPY DISCHARGE SUMMARY  Visits from Start of Care: 8  Current functional level related to goals / functional outcomes: Pt feels good about current level. Scored lowered on PROM; however, pt reports he felt like he has increased awareness of what is happening now than before starting therapy. He feels confident he has strategies to be successful.    Remaining deficits: Mild memory   Education / Equipment: Completed   Patient agrees to discharge. Patient goals were partially met. Patient is being discharged due to being pleased with the current functional level..   END OF SESSION:  End of Session - 04/08/24 1102     Visit Number 8    Date for Recertification  04/15/24    SLP Start Time 1100    SLP Stop Time  1140    SLP Time Calculation (min) 40 min    Activity Tolerance Patient tolerated treatment well          Past Medical History:  Diagnosis Date   Aortic atherosclerosis 01/20/2017   Aortic stenosis 01/20/2017   Arthritis    CAD (coronary artery disease), native coronary artery 01/20/2017   GERD (gastroesophageal reflux disease)    Hiatal hernia    History of kidney stones    Hyperlipidemia    Hypothyroidism    Restless leg syndrome    Stroke [facility])    Past Surgical History:  Procedure Laterality Date   AORTIC VALVE REPLACEMENT N/A 01/29/2017   Procedure: AORTIC VALVE REPLACEMENT (AVR);  Surgeon: Fleeta Ochoa, Maude, MD;  Location: [facility] OR;  Service: Open Heart Surgery;  Laterality: N/A;   APPENDECTOMY     BLEPHAROPLASTY Bilateral    bowel obstruction surgery     CATARACT EXTRACTION Bilateral 2018   CHOLECYSTECTOMY     CORONARY ARTERY BYPASS GRAFT N/A 01/29/2017   Procedure: CORONARY ARTERY  BYPASS GRAFTING (CABG)  x two, using left internal mammary artery and right leg greater saphenous vein harvested endoscopically;  Surgeon: Fleeta Ochoa Maude, MD;  Location: [facility] OR;  Service: Open Heart Surgery;  Laterality: N/A;   KNEE ARTHROSCOPY     lap nissan     LEFT HEART CATH AND CORONARY ANGIOGRAPHY N/A 01/21/2017   Procedure: LEFT HEART CATH AND CORONARY ANGIOGRAPHY;  Surgeon: Court Dorn PARAS, MD;  Location: [facility] INVASIVE CV LAB;  Service: Cardiovascular;  Laterality: N/A;   LEFT HEART CATH AND CORS/GRAFTS ANGIOGRAPHY N/A 11/06/2023   Procedure: LEFT HEART CATH AND CORS/GRAFTS ANGIOGRAPHY;  Surgeon: Jordan, Peter M, MD;  Location: [facility] INVASIVE CV LAB;  Service: Cardiovascular;  Laterality: N/A;   RIGHT HEART CATH N/A 01/21/2017   Procedure: RIGHT HEART CATH;  Surgeon: Court Dorn PARAS, MD;  Location: [facility] INVASIVE CV LAB;  Service: Cardiovascular;  Laterality: N/A;   SHOULDER SURGERY     TEE WITHOUT CARDIOVERSION N/A 01/29/2017   Procedure: TRANSESOPHAGEAL ECHOCARDIOGRAM (TEE);  Surgeon: Fleeta Ochoa, Maude, MD;  Location: [facility] OR;  Service: Open Heart Surgery;  Laterality: N/A;   TRANSCAROTID ARTERY REVASCULARIZATION  Right 01/08/2024   Procedure: TRANSCAROTID ARTERY REVASCULARIZATION (TCAR);  Surgeon: Magda Debby SAILOR, MD;  Location: [facility] OR;  Service: Vascular;  Laterality: Right;   Patient Active Problem List   Diagnosis Date Noted   Carotid artery stenosis 01/08/2024   Acute CVA (cerebrovascular accident) (  HCC) 12/31/2023   AKI (acute kidney injury) 04/24/2022   Hypothyroidism 04/24/2022   Heart murmur 06/12/2018   Low back pain 06/12/2018   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   Chronic kidney disease 12/16/2017   Hyperlipidemia 02/21/2017   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   Chest pain    Bradycardia 01/20/2017   Restless leg syndrome 01/20/2017   Aortic stenosis 01/20/2017   CAD (coronary artery disease), native coronary artery 01/20/2017   Aortic  atherosclerosis 01/20/2017   Cramp in lower leg associated with rest 03/05/2016   Colon polyps 08/05/2015   Encounter for screening colonoscopy 05/27/2015   Contusion of left knee 09/08/2014   PUD (peptic ulcer disease) 04/22/2012   S/P Nissen fundoplication (without gastrostomy tube) procedure 04/22/2012   Choking 03/12/2012   Dysphagia 03/12/2012   GERD (gastroesophageal reflux disease) 03/12/2012   Hoarseness of voice 03/12/2012   Gross hematuria 11/11/2009   Prostatitis 11/10/2009   Ureteric stone 11/10/2009   Arthropathy 10/07/2009    ONSET DATE: Referred on 01/01/2024   REFERRING DIAG: R29.90 (ICD-10-CM) - Stroke-like symptoms  THERAPY DIAG:  Cognitive communication deficit  Rationale for Evaluation and Treatment: Rehabilitation  SUBJECTIVE:   SUBJECTIVE STATEMENT:  Pt reports he continues to work on strategies.   Pt accompanied by: self  PERTINENT HISTORY: 83 y.o. M with CAD s/p CABG, HTN, HLD, AS s/p AVR, CKD and hypothyroidism who presented with acute left facial droop and weakness.  PAIN:  Are you having pain? No  FALLS: Has patient fallen in last 6 months?  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  DIAGNOSTIC FINDINGS: Per EMR:   MR BRAIN WO CONTRAST IMPRESSION: 1. Acute small focal cortical/subcortical infarct in the right posterior frontal lobe.     Electronically Signed   By: Evalene Coho M.D.   On: 12/31/2023 16:49  COGNITION: Overall cognitive status: Impaired Areas of impairment:  Attention: Impaired: Alternating, Divided Memory: Impaired: Short term Prospective Auditory Executive function: Impaired: Problem  solving, Organization, Planning, Self-correction, and Slow processing Functional deficits: See clinical impression  COGNITIVE COMMUNICATION: Following directions: Follows multi-step commands inconsistently  Auditory comprehension: Impaired: likely 2/2 to attention Verbal expression: Impaired: reports anomia Functional communication: Impaired: see clinical impression  ORAL MOTOR EXAMINATION: Overall status: WFL Comments: NA  STANDARDIZED ASSESSMENTS:   Cognitive Linguistic Quick Test: AGE - 18 - 69   The Cognitive Linguistic Quick Test (CLQT) was administered to assess the relative status of five cognitive domains: attention, memory, language, executive functioning, and visuospatial skills. Scores from 10 tasks were used to estimate severity ratings (standardized for age groups 18-69 years and 70-89 years) for each domain, a clock drawing task, as well as an overall composite severity rating of cognition.       Task Score Criterion Cut Scores  Personal Facts 8/8 8  Symbol Cancellation 12/12 11  Confrontation Naming 10/10 10  Clock Drawing  7/13 12  Story Retelling 5/10 6  Symbol Trails 8/10 9  Generative Naming 6/9 5  Design Memory 6/6 5  Mazes  4/8 7  Design Generation 7/13 6    Cognitive Domain Composite Score Severity Rating  Attention 177/215 WNL  Memory 152/185 WNL  Executive Function 25/40  WNL  Language 29/37 WNL  Visuospatial Skills 83/105 WNL  Clock Drawing  7/13 Moderate  Composite Severity Rating  WNL       PATIENT REPORTED OUTCOME MEASURES (PROM): Neuro QOL - Cognition: 99/140 Neuro QOL - Cognition: 91/140 (lowered score -we discussed this and it is likely due to increased awareness of deficits). He feels like there is no question that this is helped me - I have told all my friends about how enlightening and helpful this has been.                                                                                                                             TREATMENT DATE:   04/08/24: Pt was seen for skilled ST services targeting cognitive-communication. Completed education on internal memory strategies. He does report fatigue this week which he feels like he has impacted thinking skills a bit. Briefly educated on the spoon theory and the importance of energy conservation. He reports he has been using acronyms to recall shortened grocery list. Pt was able to provide relevant examples for strategy implementation. Pt feels comfortable with discharge at this time. He feels like he has made improvement. Pt is able to recall several strategies from word finding, thought organization, and memory without any cueing from therapist.   04/01/24: Pt was seen for skilled ST services targeting cognitive-communication. SLP initiated education on memory strategies. SLP educated on the following re: external memory strategies (routine - coffee and calendars, written repetition, lists, timers, medication boxes, putting items in the same place, smart devices - voice memos). Pt reports he does not have trouble keeping up with appointments at this time; however, he feels like he may not remember something [name] tells him (grocery list etc.). He shared he has used an acronym (LDS) to remember items at the grocery store. To complete internal memory strategies and discharge next session.   03/25/24: Pt was seen for skilled ST services targeting cognitive-communication. He reports things have been going really good - he is more conscious about his conversations and how to use strategies in conversation. Used delay successfully in instance of anomia yesterday. He reports he has been redirected himself and his conversations when he gets off topic. Continued education on throughout organization strategies re: practicing regularly, using visuals (at work), breaking information into chunks, and practicing active rephrasing.  To begin memory strategies next session.   03/11/24: Pt was  seen for skilled ST services targeting cognitive-communication. He reports he was able to utilize some of his strategies as he has had some morning meetings with friends. He reports he paused when he needed to and has been working on narrowing it down to provide context and attempt to keep on track. He also asked others to give him a minute when he got stuck. He is feeling more confident when entering into a conversation. Initiated education on Thought Organization strategies re: pause, writing down points (I.e. scripting), using simple language, structuring  thoughts (PREP) method, active listening, asking for feedback, practicing regularly, use visual aids, breaking information in chunks, practice active rephrasing. Pt was able to provide relevant examples that pply to his daily life.   03/05/24: Pt was seen for skilled ST services targeting cognitive-communication. Pt reports he was able to practice using strategies; specifically, pausing with a purpose. He reports he practiced using it on his wife, [name]. He reports it was able to bring his thought back most of the time. Continued with education on word finding strategies re: association, synonym, first letter, gestures, draw, look it up, narrow it down, and come back later. PREP method education scheduled for next session re: structuring conversations.   02/26/24: Pt was seen for skilled ST services targeting cognitive-communication. Pt reports he gets off topic and goes on longer and forgets where he started.SLP gleaned he was describing difficulties with topic maintenance and thought organization - SLP encouraged scripting before phone calls to assist with organization. Briefly education on eliminating distractions during conversations. To provide additional thought organization strategies. SLP initiated education on word retrieval strategies re: delay (pause with purpose) and describe (circumlocution). To cont with word finding strategies next  session. Pt to practice delay and scripting for HEP.    02/19/24: Pt was seen for skilled ST services targeting continued assessment, PROM completion, and education. SLP reviewed scores on CLQT - see above - and utilized PROM to assist with goal setting. 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 Based off scores and discussions, we have decided to target word retrieval and memory strategies to support cognitive-communication. Pt verbalized understanding. To initiate strategy education next session.     PATIENT EDUCATION: Education details: Slp role in cognitive communication Person educated: Patient Education method: Explanation Education comprehension: verbalized understanding and needs further education   GOALS: Goals reviewed with patient? No  SHORT TERM GOALS: Target date: 03/15/24  Complete CLQT, PROM, and update goals.  Baseline: Goal status: MET  2.  Pt will verbalize two strategies to support recall of important information in conversations/home/community. Baseline:  Goal status: NOT MET  3.  Pt will verbalize 2 strategies to encourage word retrieval in conversations.  Baseline:  Goal status: MET    LONG TERM GOALS: Target date: 04/15/24  Improve score on PROM Baseline:  Goal status: NOT MET; (lowered score -we discussed this and it is likely due to increased awareness of deficits).   2.  Pt will provide examples of strategy implementation in supporting recall of important information in conversations/home/community. Baseline:  Goal status: MET  3.  Pt will report and/or demonstrate use of anomia compensations in unstructured conversation. Baseline:  Goal status: MET; uses delay/purposeful pause.    ASSESSMENT:  CLINICAL IMPRESSION: Pt is a 83 yo male who presented to ST OP for evaluation post CVA.  SEE TX NOTE. Pt to d/c  from therapy today. He feels comfortable with how he is doing at home and in the community. He understands he is able to return to therapy with referral from physician.    OBJECTIVE IMPAIRMENTS: include attention, memory, and executive functioning. These impairments are limiting patient from managing medications, managing appointments, managing finances, household responsibilities, and effectively communicating at home and in community. Factors affecting potential to achieve goals and functional outcome are NA.SABRA Patient will benefit from skilled SLP services to address above impairments and improve overall function.  REHAB POTENTIAL: Good  PLAN:  SLP FREQUENCY: 1x/week  SLP DURATION: 8 weeks  PLANNED INTERVENTIONS: Environmental controls, Cueing hierachy, Cognitive reorganization, Internal/external aids, Functional tasks, SLP instruction and feedback, Compensatory strategies, Patient/family education, and [id number] Treatment of speech (30 or 45 min)     [employer], CCC-SLP 04/08/2024, 11:02 AM

## 2024-04-15 DIAGNOSIS — Z23 Encounter for immunization: Secondary | ICD-10-CM | POA: Diagnosis not present

## 2024-05-04 DIAGNOSIS — D649 Anemia, unspecified: Secondary | ICD-10-CM | POA: Diagnosis not present

## 2024-05-14 DIAGNOSIS — E039 Hypothyroidism, unspecified: Secondary | ICD-10-CM | POA: Diagnosis not present

## 2024-05-14 DIAGNOSIS — D649 Anemia, unspecified: Secondary | ICD-10-CM | POA: Diagnosis not present

## 2024-05-14 DIAGNOSIS — H9193 Unspecified hearing loss, bilateral: Secondary | ICD-10-CM | POA: Diagnosis not present

## 2024-05-14 DIAGNOSIS — E785 Hyperlipidemia, unspecified: Secondary | ICD-10-CM | POA: Diagnosis not present

## 2024-05-14 DIAGNOSIS — L821 Other seborrheic keratosis: Secondary | ICD-10-CM | POA: Diagnosis not present

## 2024-06-18 ENCOUNTER — Telehealth: Payer: Self-pay | Admitting: Cardiology

## 2024-06-18 NOTE — Telephone Encounter (Signed)
 Pt c/o redness at carotid stent incision site that started 5 days ago. Pt states it was inflamed over night and put a bandaid over for 4 days, scab on incision and old blood on bandaid. Pt says he put the bandaid over to protect it and keep from hitting it or irritating the site. Surgery was 01/08/24. Will send to Dr Pietro for further recommendations.

## 2024-06-18 NOTE — Telephone Encounter (Signed)
 Pt carotid stent site has been red, irritated, and scabbed over. Please advise.

## 2024-06-18 NOTE — Telephone Encounter (Addendum)
 Spoke with pt, aware per dr pietro to reach out to dr magda. The patient reports he called dr magda and they said to contact us . Patient feels Dr Cleda should take care of it because they did the surgery. He is going to call them again and if they will not see him, he will let me know so I can make him an appointment with cardiology.
